# Patient Record
Sex: Female | Born: 1940
Health system: Southern US, Community
[De-identification: ages and names within clinical notes are randomized; demographics above are authoritative.]

## PROBLEM LIST (undated history)

## (undated) DIAGNOSIS — Z9889 Other specified postprocedural states: Secondary | ICD-10-CM

## (undated) DIAGNOSIS — J189 Pneumonia, unspecified organism: Secondary | ICD-10-CM

## (undated) DIAGNOSIS — Z860100 Personal history of colon polyps, unspecified: Secondary | ICD-10-CM

## (undated) DIAGNOSIS — J302 Other seasonal allergic rhinitis: Secondary | ICD-10-CM

## (undated) DIAGNOSIS — M19049 Primary osteoarthritis, unspecified hand: Secondary | ICD-10-CM

## (undated) DIAGNOSIS — H353290 Exudative age-related macular degeneration, unspecified eye, stage unspecified: Secondary | ICD-10-CM

## (undated) DIAGNOSIS — I10 Essential (primary) hypertension: Secondary | ICD-10-CM

## (undated) DIAGNOSIS — I493 Ventricular premature depolarization: Secondary | ICD-10-CM

## (undated) DIAGNOSIS — E039 Hypothyroidism, unspecified: Secondary | ICD-10-CM

## (undated) DIAGNOSIS — C4490 Unspecified malignant neoplasm of skin, unspecified: Secondary | ICD-10-CM

## (undated) DIAGNOSIS — Z85828 Personal history of other malignant neoplasm of skin: Secondary | ICD-10-CM

## (undated) DIAGNOSIS — I499 Cardiac arrhythmia, unspecified: Secondary | ICD-10-CM

## (undated) HISTORY — DX: Other seasonal allergic rhinitis: J30.2

## (undated) HISTORY — DX: Personal history of other malignant neoplasm of skin: Z85.828

## (undated) HISTORY — PX: OTHER SURGICAL HISTORY: SHX169

## (undated) HISTORY — PX: TONSILLECTOMY: SUR1361

## (undated) HISTORY — DX: Essential (primary) hypertension: I10

## (undated) HISTORY — DX: Unspecified malignant neoplasm of skin, unspecified: C44.90

## (undated) HISTORY — DX: Ventricular premature depolarization: I49.3

## (undated) HISTORY — PX: KNEE CARTILAGE SURGERY: SHX688

## (undated) HISTORY — DX: Personal history of colonic polyps: Z86.010

## (undated) HISTORY — DX: Primary osteoarthritis, unspecified hand: M19.049

## (undated) HISTORY — PX: EYE SURGERY: SHX253

## (undated) HISTORY — DX: Personal history of colon polyps, unspecified: Z86.0100

## (undated) HISTORY — PX: FACIAL COSMETIC SURGERY: SHX629

## (undated) HISTORY — DX: Hypothyroidism, unspecified: E03.9

---

## 1999-02-09 ENCOUNTER — Emergency Department (HOSPITAL_COMMUNITY): Admission: EM | Admit: 1999-02-09 | Discharge: 1999-02-09 | Payer: Self-pay | Admitting: *Deleted

## 1999-08-14 ENCOUNTER — Other Ambulatory Visit: Admission: RE | Admit: 1999-08-14 | Discharge: 1999-08-14 | Payer: Self-pay | Admitting: Obstetrics and Gynecology

## 1999-09-23 ENCOUNTER — Other Ambulatory Visit: Admission: RE | Admit: 1999-09-23 | Discharge: 1999-09-23 | Payer: Self-pay | Admitting: Urology

## 1999-12-23 HISTORY — PX: FACIAL COSMETIC SURGERY: SHX629

## 2000-08-12 ENCOUNTER — Other Ambulatory Visit: Admission: RE | Admit: 2000-08-12 | Discharge: 2000-08-12 | Payer: Self-pay | Admitting: Obstetrics and Gynecology

## 2000-09-16 ENCOUNTER — Encounter: Admission: RE | Admit: 2000-09-16 | Discharge: 2000-09-16 | Payer: Self-pay | Admitting: Obstetrics and Gynecology

## 2000-09-16 ENCOUNTER — Encounter: Payer: Self-pay | Admitting: Obstetrics and Gynecology

## 2001-08-11 ENCOUNTER — Other Ambulatory Visit: Admission: RE | Admit: 2001-08-11 | Discharge: 2001-08-11 | Payer: Self-pay | Admitting: Obstetrics and Gynecology

## 2001-09-17 ENCOUNTER — Encounter: Payer: Self-pay | Admitting: Obstetrics and Gynecology

## 2001-09-17 ENCOUNTER — Encounter: Admission: RE | Admit: 2001-09-17 | Discharge: 2001-09-17 | Payer: Self-pay | Admitting: Obstetrics and Gynecology

## 2002-08-16 ENCOUNTER — Other Ambulatory Visit: Admission: RE | Admit: 2002-08-16 | Discharge: 2002-08-16 | Payer: Self-pay | Admitting: Gynecology

## 2002-09-22 ENCOUNTER — Encounter: Payer: Self-pay | Admitting: Gynecology

## 2002-09-22 ENCOUNTER — Encounter: Admission: RE | Admit: 2002-09-22 | Discharge: 2002-09-22 | Payer: Self-pay | Admitting: Gynecology

## 2003-08-21 ENCOUNTER — Other Ambulatory Visit: Admission: RE | Admit: 2003-08-21 | Discharge: 2003-08-21 | Payer: Self-pay | Admitting: Gynecology

## 2003-10-03 ENCOUNTER — Encounter: Payer: Self-pay | Admitting: Gynecology

## 2003-10-03 ENCOUNTER — Encounter: Admission: RE | Admit: 2003-10-03 | Discharge: 2003-10-03 | Payer: Self-pay | Admitting: Gynecology

## 2003-12-23 HISTORY — PX: SHOULDER ARTHROSCOPY W/ ROTATOR CUFF REPAIR: SHX2400

## 2004-08-21 ENCOUNTER — Other Ambulatory Visit: Admission: RE | Admit: 2004-08-21 | Discharge: 2004-08-21 | Payer: Self-pay | Admitting: Gynecology

## 2004-11-28 ENCOUNTER — Encounter: Admission: RE | Admit: 2004-11-28 | Discharge: 2004-11-28 | Payer: Self-pay | Admitting: Gynecology

## 2005-02-28 ENCOUNTER — Ambulatory Visit (HOSPITAL_COMMUNITY): Admission: RE | Admit: 2005-02-28 | Discharge: 2005-03-01 | Payer: Self-pay | Admitting: Orthopaedic Surgery

## 2005-08-26 ENCOUNTER — Other Ambulatory Visit: Admission: RE | Admit: 2005-08-26 | Discharge: 2005-08-26 | Payer: Self-pay | Admitting: Gynecology

## 2005-12-25 ENCOUNTER — Encounter: Admission: RE | Admit: 2005-12-25 | Discharge: 2005-12-25 | Payer: Self-pay | Admitting: Gynecology

## 2006-05-07 ENCOUNTER — Encounter: Payer: Self-pay | Admitting: Vascular Surgery

## 2006-05-07 ENCOUNTER — Ambulatory Visit (HOSPITAL_COMMUNITY): Admission: RE | Admit: 2006-05-07 | Discharge: 2006-05-07 | Payer: Self-pay | Admitting: *Deleted

## 2006-09-01 ENCOUNTER — Other Ambulatory Visit: Admission: RE | Admit: 2006-09-01 | Discharge: 2006-09-01 | Payer: Self-pay | Admitting: Gynecology

## 2006-09-21 ENCOUNTER — Ambulatory Visit: Payer: Self-pay | Admitting: Internal Medicine

## 2006-11-17 ENCOUNTER — Ambulatory Visit: Payer: Self-pay | Admitting: Internal Medicine

## 2006-11-17 LAB — CONVERTED CEMR LAB
Chol/HDL Ratio, serum: 4.8
Cholesterol: 247 mg/dL (ref 0–200)

## 2006-11-24 ENCOUNTER — Ambulatory Visit: Payer: Self-pay | Admitting: Internal Medicine

## 2006-12-28 ENCOUNTER — Encounter: Admission: RE | Admit: 2006-12-28 | Discharge: 2006-12-28 | Payer: Self-pay | Admitting: Gynecology

## 2007-01-06 ENCOUNTER — Ambulatory Visit: Payer: Self-pay | Admitting: Internal Medicine

## 2007-01-06 LAB — CONVERTED CEMR LAB
AST: 17 units/L (ref 0–37)
HDL: 47.3 mg/dL (ref >39.0)
LDL Cholesterol: 82 mg/dL (ref 0–99)

## 2007-01-12 ENCOUNTER — Ambulatory Visit: Payer: Self-pay | Admitting: Internal Medicine

## 2007-05-24 ENCOUNTER — Ambulatory Visit: Payer: Self-pay | Admitting: Internal Medicine

## 2007-05-28 ENCOUNTER — Ambulatory Visit: Payer: Self-pay

## 2007-07-16 ENCOUNTER — Encounter: Payer: Self-pay | Admitting: Internal Medicine

## 2007-08-13 ENCOUNTER — Encounter: Payer: Self-pay | Admitting: Internal Medicine

## 2007-08-30 DIAGNOSIS — Z860100 Personal history of colon polyps, unspecified: Secondary | ICD-10-CM | POA: Insufficient documentation

## 2007-08-30 DIAGNOSIS — E039 Hypothyroidism, unspecified: Secondary | ICD-10-CM | POA: Insufficient documentation

## 2007-08-30 HISTORY — DX: Personal history of colonic polyps: Z86.010

## 2007-08-30 HISTORY — DX: Personal history of colon polyps, unspecified: Z86.0100

## 2007-09-14 ENCOUNTER — Encounter: Payer: Self-pay | Admitting: Internal Medicine

## 2007-09-14 ENCOUNTER — Other Ambulatory Visit: Admission: RE | Admit: 2007-09-14 | Discharge: 2007-09-14 | Payer: Self-pay | Admitting: Gynecology

## 2007-11-03 ENCOUNTER — Telehealth: Payer: Self-pay | Admitting: Internal Medicine

## 2008-01-07 ENCOUNTER — Encounter: Admission: RE | Admit: 2008-01-07 | Discharge: 2008-01-07 | Payer: Self-pay | Admitting: Gynecology

## 2008-02-02 ENCOUNTER — Ambulatory Visit: Payer: Self-pay | Admitting: Internal Medicine

## 2008-02-02 DIAGNOSIS — M79609 Pain in unspecified limb: Secondary | ICD-10-CM | POA: Insufficient documentation

## 2008-02-02 DIAGNOSIS — Z85828 Personal history of other malignant neoplasm of skin: Secondary | ICD-10-CM

## 2008-02-02 DIAGNOSIS — E782 Mixed hyperlipidemia: Secondary | ICD-10-CM | POA: Insufficient documentation

## 2008-02-02 HISTORY — DX: Personal history of other malignant neoplasm of skin: Z85.828

## 2008-02-02 LAB — CONVERTED CEMR LAB: Cholesterol, target level: 200 mg/dL

## 2008-05-16 ENCOUNTER — Ambulatory Visit: Payer: Self-pay | Admitting: Internal Medicine

## 2008-05-16 DIAGNOSIS — J019 Acute sinusitis, unspecified: Secondary | ICD-10-CM | POA: Insufficient documentation

## 2008-05-23 ENCOUNTER — Telehealth: Payer: Self-pay | Admitting: Internal Medicine

## 2008-09-21 LAB — CONVERTED CEMR LAB: Pap Smear: NORMAL

## 2008-10-24 ENCOUNTER — Encounter: Payer: Self-pay | Admitting: Internal Medicine

## 2009-01-18 ENCOUNTER — Telehealth: Payer: Self-pay | Admitting: *Deleted

## 2009-01-19 ENCOUNTER — Encounter: Admission: RE | Admit: 2009-01-19 | Discharge: 2009-01-19 | Payer: Self-pay | Admitting: Gynecology

## 2009-01-23 ENCOUNTER — Telehealth: Payer: Self-pay | Admitting: *Deleted

## 2009-04-24 ENCOUNTER — Ambulatory Visit: Payer: Self-pay | Admitting: Internal Medicine

## 2009-04-24 DIAGNOSIS — M19049 Primary osteoarthritis, unspecified hand: Secondary | ICD-10-CM | POA: Insufficient documentation

## 2009-04-24 HISTORY — DX: Primary osteoarthritis, unspecified hand: M19.049

## 2009-11-19 ENCOUNTER — Encounter (INDEPENDENT_AMBULATORY_CARE_PROVIDER_SITE_OTHER): Payer: Self-pay | Admitting: *Deleted

## 2010-01-22 ENCOUNTER — Encounter: Admission: RE | Admit: 2010-01-22 | Discharge: 2010-01-22 | Payer: Self-pay | Admitting: Gynecology

## 2010-01-22 LAB — HM MAMMOGRAPHY

## 2010-04-16 ENCOUNTER — Telehealth: Payer: Self-pay | Admitting: *Deleted

## 2010-04-26 ENCOUNTER — Ambulatory Visit: Payer: Self-pay | Admitting: Internal Medicine

## 2010-04-26 LAB — CONVERTED CEMR LAB
ALT: 20 units/L (ref 0–35)
AST: 19 units/L (ref 0–37)
Albumin: 4.2 g/dL (ref 3.5–5.2)
Alkaline Phosphatase: 65 units/L (ref 39–117)
Bilirubin, Direct: 0.1 mg/dL (ref 0.0–0.3)
Cholesterol: 168 mg/dL (ref 0–200)
Total CHOL/HDL Ratio: 3
Total Protein: 6.6 g/dL (ref 6.0–8.3)

## 2010-04-30 ENCOUNTER — Ambulatory Visit: Payer: Self-pay | Admitting: Internal Medicine

## 2010-04-30 DIAGNOSIS — R109 Unspecified abdominal pain: Secondary | ICD-10-CM | POA: Insufficient documentation

## 2010-04-30 DIAGNOSIS — I495 Sick sinus syndrome: Secondary | ICD-10-CM | POA: Insufficient documentation

## 2010-06-11 ENCOUNTER — Ambulatory Visit: Payer: Self-pay | Admitting: Internal Medicine

## 2010-08-15 ENCOUNTER — Encounter: Payer: Self-pay | Admitting: Internal Medicine

## 2010-11-25 ENCOUNTER — Encounter: Payer: Self-pay | Admitting: Internal Medicine

## 2010-11-27 ENCOUNTER — Encounter: Payer: Self-pay | Admitting: Internal Medicine

## 2010-11-27 ENCOUNTER — Ambulatory Visit: Payer: Self-pay | Admitting: Internal Medicine

## 2010-11-27 LAB — CONVERTED CEMR LAB
INR: 1 (ref 0.8–1.0)
Prothrombin Time: 10.3 s (ref 9.7–11.8)
aPTT: 32.4 s — ABNORMAL HIGH (ref 21.7–28.8)

## 2010-11-28 ENCOUNTER — Encounter: Payer: Self-pay | Admitting: Internal Medicine

## 2011-01-22 ENCOUNTER — Other Ambulatory Visit: Payer: Self-pay | Admitting: Gynecology

## 2011-01-22 DIAGNOSIS — Z1239 Encounter for other screening for malignant neoplasm of breast: Secondary | ICD-10-CM

## 2011-01-23 NOTE — Assessment & Plan Note (Signed)
Summary: MED CLEARANCE // RS   Vital Signs:  Patient profile:   70 year old female Menstrual status:  postmenopausal Temp:     98.1 degrees F oral Pulse rate:   60 / minute BP sitting:   110 / 60  (left arm) Cuff size:   regular  Vitals Entered By: Romualdo Bolk, CMA (AAMA) (November 27, 2010 11:31 AM) CC: Medical Clearence for cosmetic surgery. Surgery to be done on 12/24/10. Pt needs cbc with diff, CMP, Pt/PTT and EKG   History of Present Illness: Christina Myers   comes in today  for above . Since last visit  here  there have been no major changes in health status  . Cosmetic surgery.  face  lift  redo  in a few weeks at [facility]   Since last visit  here  there have been no major changes in health status  .  Ob/  gyne  doing ok.   Thyroid :   had blood drawn.   this week ? results . NO bleeding Cv pulm issues,  very active.    Preventive Screening-Counseling & Management  Alcohol-Tobacco     Alcohol drinks/day: <1     Smoking Status: quit     Year Quit: over 10 years  Caffeine-Diet-Exercise     Caffeine use/day: 1-2     Does Patient Exercise: yes  Current Medications (verified): 1)  Calcium 500 Mg Tabs (Calcium) .... Take 2)  D 1000 1000 Unit Caps (Cholecalciferol) .... 2000 International Units A Day 3)  Synthroid 100 Mcg Tabs (Levothyroxine Sodium) .... Take 1 Tablet By Mouth Once A Day 4)  Vitamin E 400 Unit Caps (Vitamin E) .... Take 5)  Estradiol 0.025 Mg/24hr Ptwk (Estradiol) 6)  Zocor 20 Mg Tabs (Simvastatin) .... Take 1 Tablet By Mouth Every Night. 7)  Adult Aspirin Low Strength 81 Mg  Tbdp (Aspirin) 8)  Prometrium 200 Mg  Caps (Progesterone Micronized) .[name].. 12 Days Semi-Annually 9)  Estrace 0.1 Mg/gm Crea (Estradiol)  Allergies (verified): 1)  ! Omnicef 2)  * Augmentin  Past History:  Past medical, surgical, family and social histories (including risk factors) reviewed, and no changes noted (except as noted below).  Past Medical History: Reviewed  history from 02/02/2008 and no changes required. Colonic polyps, hx of Hypertension Hypothyroidism Allergies Skin cancer, hx ofbcca of face [facility] evaluation for PVCs 7/03  Past Surgical History: Reviewed history from 08/30/2007 and no changes required. Tubal ligation Tonsillectomy  Past History:  Care Management: Cardiology: [facility]- in the past Gynecology: Pamelia Hoit, NPA Gastroenterology: Dr. Kinnie Scales Dermatology: Karlyn Agee  yearly body check  Surgery: Plastic- Dr. Oswaldo Milian  Family History: Reviewed history from 08/30/2007 and no changes required. Family History of Colon CA 1st degree relative <60 Family History Diabetes 1st degree relative Family History of Prostate CA 1st degree relative <50 Family History of Stroke F 1st degree relative <60 Family History of Cardiovascular disorder  Social History: Reviewed history from 04/30/2010 and no changes required. Occupation: Married Former Smoker Alcohol use-no Regular exercise-yes  plays tennis.  A pet cat  hh of 2  tennis and teaches tap dancing classes  Review of Systems       12 sytem review neg for dcv pulm gi issues .   no se of meds  gets ocass HA .    Physical Exam  General:  Well-developed,well-nourished,in no acute distress; alert,appropriate and cooperative throughout examination Head:  normocephalic and atraumatic.   Eyes:  PERRL,  EOMs full, conjunctiva clear  Ears:  no external deformities.   Nose:  no external deformity, no external erythema, and no nasal discharge.   Mouth:  good dentition and pharynx pink and moist.   Neck:  No deformities, masses, or tenderness noted. Lungs:  Normal respiratory effort, chest expands symmetrically. Lungs are clear to auscultation, no crackles or wheezes.no dullness.   Heart:  Normal rate and regular rhythm. S1 and S2 normal without gallop, murmur, click, rub or other extra sounds.no lifts.   Abdomen:  Bowel sounds positive,abdomen soft and non-tender  without masses, organomegaly or hernias noted. Pulses:  pulses intact without delay  no bruits  Extremities:  no clubbing cyanosis or edema  Neurologic:  alert & oriented X3, strength normal in all extremities, gait normal, and DTRs symmetrical and normal.   Skin:  turgor normal, color normal, no ecchymoses, and no petechiae.   Cervical Nodes:  No lymphadenopathy noted Psych:  Normal eye contact, appropriate affect. Cognition appears normal.  EKG NSR  ocass pvc  rhythm stip  normal sinus .  Impression & Recommendations:  Problem # 1:  PREOPERATIVE EXAMINATION (ICD-V72.84) no ci to surgery . healthy   low risk.  [name]  avoid asa pre op.   Orders: TLB-PTT (85730-PTTL) TLB-PT (Protime) (85610-PTP) Venipuncture (81191) Specimen Handling (47829) EKG w/ Interpretation (93000)  Problem # 2:  HYPERLIPIDEMIA (ICD-272.2)  Her updated medication list for this problem includes:    Zocor 20 Mg Tabs (Simvastatin) .[name]... Take 1 tablet by mouth every night.  Orders: EKG w/ Interpretation (93000)  Problem # 3:  HYPOTHYROIDISM (ICD-244.9)  per gyne .  ok to  transfer care from gyne when needed.  get copy of labs from gyne Her updated medication list for this problem includes:    Synthroid 100 Mcg Tabs (Levothyroxine sodium) .[name]... Take 1 tablet by mouth once a day  Orders: EKG w/ Interpretation (93000)  Complete Medication List: 1)  Calcium 500 Mg Tabs (Calcium) .... Take 2)  D 1000 1000 Unit Caps (Cholecalciferol) .... 2000 international units a day 3)  Synthroid 100 Mcg Tabs (Levothyroxine sodium) .... Take 1 tablet by mouth once a day 4)  Vitamin E 400 Unit Caps (Vitamin e) .... Take 5)  Estradiol 0.025 Mg/24hr Ptwk (Estradiol) 6)  Zocor 20 Mg Tabs (Simvastatin) .... Take 1 tablet by mouth every night. 7)  Adult Aspirin Low Strength 81 Mg Tbdp (Aspirin) 8)  Prometrium 200 Mg Caps (Progesterone micronized) .[name].. 12 days semi-annually 9)  Estrace 0.1 Mg/gm Crea (Estradiol)  Patient  Instructions: 1)  You will be informed of lab results when available.  2)  will fax  information to surgeon.  3)  Low risk and ok to to proceed with surgery.    Orders Added: 1)  TLB-PTT [85730-PTTL] 2)  TLB-PT (Protime) [85610-PTP] 3)  Venipuncture [56213] 4)  Specimen Handling [99000] 5)  Est. Patient Level IV [08657] 6)  EKG w/ Interpretation [93000]

## 2011-01-23 NOTE — Procedures (Signed)
Summary: Colonoscopy Report/Dr. Sharrell Ku  Colonoscopy Report/Dr. Sharrell Ku   Imported By: Maryln Gottron 08/23/2010 13:09:38  _____________________________________________________________________  External Attachment:    Type:   Image     Comment:   External Document

## 2011-01-23 NOTE — Assessment & Plan Note (Signed)
Summary: shingles vaccine per Meela Wareing/cjr   Nurse Visit   Allergies: 1)  ! Omnicef 2)  * Augmentin  Immunizations Administered:  Zostavax # 1:    Vaccine Type: Zostavax    Site: left deltoid    Mfr: Merck    Dose: 0.5 ml    Route: [place]    Given by: Romualdo Bolk, CMA (AAMA)    Exp. Date: 07/17/2011    Lot #: [id number]  Orders Added: 1)  Zoster (Shingles) Vaccine Live [90736] 2)  Admin 1st Vaccine [phone]

## 2011-01-23 NOTE — Assessment & Plan Note (Signed)
Summary: follow up on labs/ssc pt rsc/njr/pt to come in at 1:30pm/ssc   Vital Signs:  Patient profile:   70 year old female Menstrual status:  postmenopausal Temp:     98.2 degrees F oral Pulse rate:   60 / minute BP sitting:   120 / 80  (right arm) Cuff size:   regular  Vitals Entered By: Romualdo Bolk, CMA (AAMA) (Apr 30, 2010 1:50 PM) declined Weight. CC: Follow-up visit on labs   History of Present Illness: Christina Myers comesin comes in today  for follow up of medication lipid managment. Since last visit  here  there have been no major changes in health status   however she has a new onset concern  below:  abd cramps after eating for 8 days  about 8-9   and then problem until 4 am., Husband had one episode. But is better  May1st  first has had waves of nausea and no  vomiting    early in am  .   The this progressed .  Continues to remain as active as possible  no cp sob.  Travel  [place]  in February    cruise   1-2 weeks  .   NO recent antibiotic . NO weight loss.  Is taking  otc fish oil but not new.  No change IN thyroid med.  City water   No change in meds   UTD colonoscopy   3 years ago.    lat gyne check NOv ok  Preventive Screening-Counseling & Management  Alcohol-Tobacco     Alcohol drinks/day: <1     Smoking Status: quit     Year Quit: over 10 years  Caffeine-Diet-Exercise     Caffeine use/day: 1-2     Does Patient Exercise: yes  Current Medications (verified): 1)  Calcium 500 Mg Tabs (Calcium) .... Take 2)  D 1000 1000 Unit Caps (Cholecalciferol) .... Take 3)  Estring 2 Mg Ring (Estradiol) .... Insert 4)  Synthroid 100 Mcg Tabs (Levothyroxine Sodium) .... Take 1 Tablet By Mouth Once A Day 5)  Vitamin E 400 Unit Caps (Vitamin E) .... Take 6)  Estradiol 0.025 Mg/24hr Ptwk (Estradiol) 7)  Zocor 20 Mg Tabs (Simvastatin) .... Take 1 Tablet By Mouth Every Night. 8)  Adult Aspirin Low Strength 81 Mg  Tbdp (Aspirin) 9)  Prometrium 200 Mg  Caps  (Progesterone Micronized) .[name].. 12 Days Quarterly  Allergies (verified): 1)  ! Omnicef 2)  * Augmentin  Past History:  Past medical, surgical, family and social histories (including risk factors) reviewed for relevance to current acute and chronic problems.  Past Medical History: Reviewed history from 02/02/2008 and no changes required. Colonic polyps, hx of Hypertension Hypothyroidism Allergies Skin cancer, hx ofbcca of face [facility] evaluation for PVCs 7/03  Past Surgical History: Reviewed history from 08/30/2007 and no changes required. Tubal ligation Tonsillectomy  Past History:  Care Management: Cardiology: [facility]- in the past Gynecology: Pamelia Hoit, NPA Gastroenterology: Dr. Kinnie Scales Dermatology: Karlyn Agee  yearly body check   Family History: Reviewed history from 08/30/2007 and no changes required. Family History of Colon CA 1st degree relative <60 Family History Diabetes 1st degree relative Family History of Prostate CA 1st degree relative <50 Family History of Stroke F 1st degree relative <60 Family History of Cardiovascular disorder  Social History: Reviewed history from 04/24/2009 and no changes required. Occupation: Married Former Smoker Alcohol use-no Regular exercise-yes  plays tennis.  A pet cat  hh of 2  Review of Systems       The patient complains of anorexia.  The patient denies fever, weight loss, weight gain, vision loss, hoarseness, chest pain, syncope, dyspnea on exertion, peripheral edema, prolonged cough, headaches, hemoptysis, melena, hematochezia, severe indigestion/heartburn, hematuria, transient blindness, difficulty walking, unusual weight change, enlarged lymph nodes, and angioedema.    Physical Exam  General:  Well-developed,well-nourished,in no acute distress; alert,appropriate and cooperative throughout examination. Patient declined weight today Head:  Normocephalic and atraumatic without obvious abnormalities. No  apparent alopecia or balding. Eyes:  vision grossly intact, pupils equal, and pupils round.   Ears:  R ear normal, L ear normal, and no external deformities.   Mouth:  pharynx pink and moist.   Neck:  No deformities, masses, or tenderness noted. thyroid palpable Lungs:  Normal respiratory effort, chest expands symmetrically. Lungs are clear to auscultation, no crackles or wheezes. Heart:  no murmur and no gallop.  slow rate 48  with ocass pause  nl perfusion Abdomen:  soft, non-tender, normal bowel sounds, no distention, no guarding, no rigidity, no rebound tenderness, no abdominal hernia, no hepatomegaly, and no splenomegaly.  no bruits   gurggly   bowel sounds  Pulses:  pulses intact without delay   Extremities:  no clubbing cyanosis or edema  Neurologic:  alert & oriented X3, strength normal in all extremities, and gait normal.   Skin:  turgor normal, color normal, no ecchymoses, no petechiae, and no purpura.   Cervical Nodes:  No lymphadenopathy noted Psych:  Oriented X3, normally interactive, good eye contact, not anxious appearing, and not depressed appearing.     Impression & Recommendations:  Problem # 1:  ABDOMINAL CRAMPS (ICD-789.00)  post prandial in  evening    with naiusea also   no chang einmeds   no other exposures  .    Seems non decript but  ? assoicated with pm meal. exam nl today otherwise     Problem # 2:  SINUS BRADYCARDIA (ICD-427.81)  rate on exam was 48 so ekg done and showed  rate of 52 at rest with nl  intervals .  No symptoms related to this  Her updated medication list for this problem includes:    Adult Aspirin Low Strength 81 Mg Tbdp (Aspirin)  Orders: EKG w/ Interpretation (93000)  Problem # 3:  HYPERLIPIDEMIA (ICD-272.2)  Her updated medication list for this problem includes:    Zocor 20 Mg Tabs (Simvastatin) .[name]... Take 1 tablet by mouth every night.  Labs Reviewed: SGOT: 19 (04/26/2010)   SGPT: 20 (04/26/2010)  Lipid Goals: Chol Goal: 200  (02/02/2008)   HDL Goal: 40 (02/02/2008)   LDL Goal: 130 (02/02/2008)   TG Goal: 150 (02/02/2008)  Prior 10 Yr Risk Heart Disease: 5 % (02/02/2008)   HDL:55.60 (04/26/2010), 47.3 (01/06/2007)  LDL:89 (04/26/2010), 82 (01/06/2007)  Chol:168 (04/26/2010), 158 (01/06/2007)  Trig:119.0 (04/26/2010), 142 (01/06/2007)  Problem # 4:  HYPOTHYROIDISM (ICD-244.9)  Her updated medication list for this problem includes:    Synthroid 100 Mcg Tabs (Levothyroxine sodium) .[name]... Take 1 tablet by mouth once a day  Labs Reviewed: Chol: 168 (04/26/2010)   HDL: 55.60 (04/26/2010)   LDL: 89 (04/26/2010)   TG: 119.0 (04/26/2010)  Problem # 5:  Preventive Health Care (ICD-V70.0) dis zostavax NOt avaialbe from manufacturer delay  agree with getting this when available.   Problem # 6:  HRT (ICD-V07.4) per Dr Nicholas Lose office   Complete Medication List: 1)  Calcium 500 Mg Tabs (Calcium) .... Take 2)  D 1000 1000 Unit Caps (Cholecalciferol) .... Take 3)  Estring 2 Mg Ring (Estradiol) .... Insert 4)  Synthroid 100 Mcg Tabs (Levothyroxine sodium) .... Take 1 tablet by mouth once a day 5)  Vitamin E 400 Unit Caps (Vitamin e) .... Take 6)  Estradiol 0.025 Mg/24hr Ptwk (Estradiol) 7)  Zocor 20 Mg Tabs (Simvastatin) .... Take 1 tablet by mouth every night. 8)  Adult Aspirin Low Strength 81 Mg Tbdp (Aspirin) 9)  Prometrium 200 Mg Caps (Progesterone micronized) .[name].. 12 days quarterly  Patient Instructions: 1)  Stop the fish oil for now  . 2)  If the stomach problem doesnt get better  in another week then  3)  would recommend   seeing   Dr Kinnie Scales   4)  otherwise    rov in a year .   Prescriptions: ZOCOR 20 MG TABS (SIMVASTATIN) Take 1 tablet by mouth every night.  #90 x 3   Entered and Authorized by:   Madelin Headings MD   Signed by:   Madelin Headings MD on 04/30/2010   Method used:   Electronically to        [facility] ([facility]ail)       [address]       [place], [place]   [address]       Ph: [phone]       Fax: [phone]   RxID:   [id number] ZOCOR 20 MG TABS (SIMVASTATIN) Take 1 tablet by mouth every night.  #30 x 12   Entered and Authorized by:   Madelin Headings MD   Signed by:   Madelin Headings MD on 04/30/2010   Method used:   Electronically to        [facility] ([facility])       [address]       [place], [place]  [address]       Ph: [phone]       Fax: [phone]   RxID:   [id number]

## 2011-01-23 NOTE — Progress Notes (Signed)
Summary: chole in up ov or ?med increase  Phone Note Call from Patient Call back at Home Phone [phone]   Caller: Patient Call For: Madelin Headings MD Summary of Call: pt had lab done in 10-2009 by dr lomax her chole was up 210 from 152 pt is on simvastin 20 mg, pt is requesting  to come in this wk fasting or med ?increase [facility] [address] pt would like about [facility] is needed. Initial call taken by: Heron Sabins,  April 16, 2010 4:53 PM  Follow-up for Phone Call        Labs were scanned into EMR. Follow-up by: Romualdo Bolk, CMA Duncan Dull),  April 16, 2010 5:03 PM  Additional Follow-up for Phone Call Additional follow up Details #1::        Would get labs first  and then OV  todiscuss .  LIPIds   LFTS    272.4   Additional Follow-up by: Madelin Headings MD,  April 16, 2010 5:16 PM    Additional Follow-up for Phone Call Additional follow up Details #2::    Pt aware and would like a 30 days to [facility] [address]. Follow-up by: Romualdo Bolk, CMA (AAMA),  April 17, 2010 1:51 PM  Prescriptions: ZOCOR 20 MG TABS (SIMVASTATIN) Take 1 tablet by mouth every night.  #30 x 0   Entered by:   Romualdo Bolk, CMA (AAMA)   Authorized by:   Madelin Headings MD   Signed by:   Romualdo Bolk, CMA (AAMA) on 04/17/2010   Method used:   Electronically to        [facility]       [address]       [place], [place]  [address]       Ph: [phone]       Fax: [phone]   RxID:   [id number]

## 2011-01-28 ENCOUNTER — Ambulatory Visit
Admission: RE | Admit: 2011-01-28 | Discharge: 2011-01-28 | Disposition: A | Discharge status: Home / Self Care | Payer: Medicare PPO | Source: Ambulatory Visit | Attending: Gynecology | Admitting: Gynecology

## 2011-01-28 DIAGNOSIS — Z1239 Encounter for other screening for malignant neoplasm of breast: Secondary | ICD-10-CM

## 2011-05-09 NOTE — Op Note (Signed)
NAMEKEIONNA, KINNAIRD NO.:  [id number]   MEDICAL RECORD NO.:  [id number]          PATIENT TYPE:  OIB   LOCATION:  5002                         FACILITY:  [facility]   PHYSICIAN:  Vanita Panda. Magnus Ivan, M.D.DATE OF BIRTH:  01-Jul-1941   DATE OF PROCEDURE:  02/28/2005  DATE OF DISCHARGE:  03/01/2005                                 OPERATIVE REPORT   PREOPERATIVE DIAGNOSIS:  Right shoulder rotator cuff tear.   POSTOPERATIVE DIAGNOSIS:  Right shoulder rotator cuff tear.   PROCEDURE:  Right shoulder arthroscopy with debridement, subacromial  decompression and arthroscopic rotator cuff repair.   SURGEON:  Vanita Panda. Magnus Ivan, M.D.   ANESTHESIA:  Regional block with general anesthesia.   BLOOD LOSS:  Minimal.   COMPLICATIONS:  None.   INDICATIONS:  Briefly, Ms. 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  She had not had problems with her shoulder before and then  some time __________ she developed pain in her shoulder after playing  tennis.  Injections were performed in her shoulder by her primary care  physician.  Subsequently, an MRI was obtained and she was found to have a  full thickness rotator cuff tear of the supraspinatus tendon.  On  examination, she certainly had weakness of the shoulder and signs of  impingement and bursitis.  It was recommended that given her level of  function, and this being her dominant shoulder, she should undergo should  arthroscopy with cuff repair.  The risks and benefits of this were explained  to her and she agreed to proceed with surgery.   PROCEDURE DESCRIPTION:  After regional anesthesia was obtained and informed  consent was obtained, she was brought to the operating room, placed supine  on the operating table and general anesthesia was obtained.  She was then  maneuvered into a beach chair position with her head appropriately secured  with a chin strap and a head strap.  Her non-operative arm was well  padded  under the ulna and wrist.  Her right arm was then prepped and draped with  DuraPrep, sterile drapes including a sterile stockinette, and was placed in  arm retractor position.  First, a posterior portal was made and the  arthroscope was inserted into the glenohumeral joint.  There was noted nice  intact cartilage on the glenoid and the humeral head but the significant  fraying of the anterior labrum.  There was synovitis along the biceps tendon  as well and an obvious full thickness tear of the rotator cuff.  An anterior  portal was made at the interval between the biceps tendon and the  subscapularis tendon and through an anterior portal that was lateral to the  coracoid process a shaver was inserted.  Debridement was carried out within  the glenohumeral joint of the frayed labrum as well as the synovitis around  the biceps tendon and the undersurface of the rotator cuff.  The instruments  were then removed into the posterior portal, access to the subacromial space  was obtained.  A lateral portal was then made  and a subacromial  decompression was carried out, including shaving the undersurface of the  acromion and cleaning out significant bursitis.  The large rotator cuff tear  with approximately 1.5 cm of retraction was noted.  This was mobilized so it  could be brought over to its footprint on the greater tuberosity.  A second  portal just anterior to the lateral portal was made and a bioabsorbable  screw was inserted after a punch and tap into the greater tuberosity area.  Using horizontal stitch format, the rotator cuff stitches were placed into  the inferior portion of the supraspinatus tendon and the tendon was brought  over to a resting position.  This was tied down arthroscopically as well.  A  similar procedure was done with a second screw to bring the rotator cuff  over.  The shoulder was put through a range of motion and the cuff and  shoulder moved as a unit.  The  instruments were then removed and the portal  sites were all closed with interrupted 4-0 nylon suture.  A well padded  abduction pillow splint was then placed on the patient.  She was awakened,  extubated and taken to the recovery room in stable condition.      CYB/MEDQ  D:  03/01/2005  T:  03/02/2005  Job:  [id number]

## 2011-10-01 DIAGNOSIS — Z7189 Other specified counseling: Secondary | ICD-10-CM | POA: Insufficient documentation

## 2011-11-03 ENCOUNTER — Encounter: Payer: Self-pay | Admitting: Internal Medicine

## 2011-11-03 ENCOUNTER — Ambulatory Visit (INDEPENDENT_AMBULATORY_CARE_PROVIDER_SITE_OTHER): Payer: Medicare PPO | Admitting: Internal Medicine

## 2011-11-03 DIAGNOSIS — R05 Cough: Secondary | ICD-10-CM

## 2011-11-03 DIAGNOSIS — Z79890 Hormone replacement therapy (postmenopausal): Secondary | ICD-10-CM

## 2011-11-03 DIAGNOSIS — E039 Hypothyroidism, unspecified: Secondary | ICD-10-CM

## 2011-11-03 DIAGNOSIS — R059 Cough, unspecified: Secondary | ICD-10-CM | POA: Insufficient documentation

## 2011-11-03 DIAGNOSIS — E782 Mixed hyperlipidemia: Secondary | ICD-10-CM

## 2011-11-03 NOTE — Patient Instructions (Signed)
Will notify you  of labs when available.  Follow up depending on results  Have pharmacy contact us electronically for refills  Of meds. Cough may last another week. Call if fever or shortness of breath

## 2011-11-03 NOTE — Progress Notes (Signed)
  Subjective:    Patient ID: Christina Myers, female    DOB: 1941-07-27, 70 y.o.   MRN: [id number]  HPI Patient comes  in today for follow up of  multiple medical problems.  Last visit was over a year ago . Since that time:  Currently has a chest cold uri   Coughing  From  Contact person  After [place] trip.   Onset 4 day.   No fever  Cough with RTi getting better. No pain no sob    LIPIDS  No se of meds Due for labs yearly. Due for labs this year.   Thyroid  No change in meds and no hair changes edema.   hrt the same at this time  Cosmetic surgery no complications  Review of Systems ROS:  GEN/ HEENTNo fever, significant weight changes sweats headaches vision problems hearing changes, CV/ PULM; No chest pain shortness of breath , syncope,edema  change in exercise tolerance. GI /GU: No adominal pain, vomiting, change in bowel habits. No blood in the stool. No significant GU symptoms. SKIN/HEME: ,no acute skin rashes suspicious lesions or bleeding. No lymphadenopathy, nodules, masses.  NEURO/ PSYCH:  No neurologic signs such as weakness numbness No depression anxiety. IMM/ Allergy: No unusual infections.   REST of 12 system review negative     Objective:   Physical Exam WDWN in NAD  quiet respirations; mildly congested  somewhat hoarse. Non toxic . HEENT: Normocephalic ;atraumatic , Eyes;  PERRL, EOMs  Full, lids and conjunctiva clear,,Ears: no deformities, canals nl, TM landmarks normal, Nose: no deformity or discharge but congested;face non  tender Mouth : OP clear without lesion or edema . Neck: Supple without adenopathy or masses or bruits Chest:  Clear to A&P without wheezes rales or rhonchi CV:  S1-S2 no gallops or murmurs peripheral perfusion is normal Skin :nl perfusion and no acute rashes  Abdomen:  Soft normal bowel sounds without hepatosplenomegaly, no guarding rebound or masses no CVA tenderness Skin: normal capillary refill ,turgor , color: No acute rashes ,petechiae or  bruising. NEURO: oriented x 3 CN 3-12 appear intact. No focal muscle weakness or atrophy. Gait WNL.  Grossly non focal. No tremor or abnormal movement.     Assessment & Plan:  Hypothyroid  Check labs today nose  No palpitation . Lipids: no se of meds  As reported  HRT  Per gyne  Taking this to " stay young" skin etc. nad menopausual sx  Low dose.   Cough  Viral uri  Seems uncomplicated sx  Rx.  Call or seek care  if  persistent or progressive or alarm symptoms as discussed.

## 2011-11-04 LAB — CBC WITH DIFFERENTIAL/PLATELET
Basophils Relative: 0.2 % (ref 0.0–3.0)
Hemoglobin: 13.7 g/dL (ref 12.0–15.0)
Monocytes Relative: 9.4 % (ref 3.0–12.0)
Neutro Abs: 5.5 10*3/uL (ref 1.4–7.7)
Neutrophils Relative %: 70.7 % (ref 43.0–77.0)

## 2011-11-04 LAB — HEPATIC FUNCTION PANEL
ALT: 29 U/L (ref 0–35)
AST: 26 U/L (ref 0–37)
Alkaline Phosphatase: 64 U/L (ref 39–117)
Total Bilirubin: 0.5 mg/dL (ref 0.3–1.2)
Total Protein: 6.6 g/dL (ref 6.0–8.3)

## 2011-11-04 LAB — LIPID PANEL
HDL: 48.2 mg/dL (ref >39.00)
LDL Cholesterol: 57 mg/dL (ref 0–99)
Total CHOL/HDL Ratio: 3
Triglycerides: 185 mg/dL — ABNORMAL HIGH (ref 0.0–149.0)
VLDL: 37 mg/dL (ref 0.0–40.0)

## 2011-11-04 LAB — BASIC METABOLIC PANEL
CO2: 28 mEq/L (ref 19–32)
Chloride: 103 mEq/L (ref 96–112)
Potassium: 4.4 mEq/L (ref 3.5–5.1)
Sodium: 141 mEq/L (ref 135–145)

## 2011-11-04 LAB — TSH: TSH: 0.49 u[IU]/mL (ref 0.35–5.50)

## 2011-11-06 ENCOUNTER — Encounter: Payer: Self-pay | Admitting: *Deleted

## 2011-11-06 DIAGNOSIS — Z79890 Hormone replacement therapy (postmenopausal): Secondary | ICD-10-CM | POA: Insufficient documentation

## 2011-11-06 NOTE — Progress Notes (Signed)
Quick Note:    Letter sent  ______

## 2012-01-09 ENCOUNTER — Other Ambulatory Visit: Payer: Self-pay | Admitting: Gynecology

## 2012-01-09 DIAGNOSIS — Z1231 Encounter for screening mammogram for malignant neoplasm of breast: Secondary | ICD-10-CM

## 2012-01-21 ENCOUNTER — Telehealth: Payer: Self-pay | Admitting: *Deleted

## 2012-01-21 MED ORDER — SYNTHROID 100 MCG PO TABS: 100.0000 ug | ORAL_TABLET | Freq: Every day | ORAL | Status: DC

## 2012-01-21 NOTE — Telephone Encounter (Signed)
refill 

## 2012-01-23 ENCOUNTER — Telehealth: Payer: Self-pay | Admitting: Internal Medicine

## 2012-01-23 MED ORDER — SIMVASTATIN 20 MG PO TABS: 20.0000 mg | ORAL_TABLET | Freq: Every day | ORAL | Status: DC

## 2012-01-23 NOTE — Telephone Encounter (Signed)
Pt called and said that she normally gets a year supply of Synthroid and Zocor. Pls call in to [facility] on [address] [phone]. Pt wants to know why this was only sent in for 90 day supply without any refills on it?

## 2012-01-23 NOTE — Telephone Encounter (Signed)
Pt aware of this. 

## 2012-01-30 ENCOUNTER — Ambulatory Visit
Admission: RE | Admit: 2012-01-30 | Discharge: 2012-01-30 | Disposition: A | Discharge status: Home / Self Care | Payer: Medicare PPO | Source: Ambulatory Visit | Attending: Gynecology | Admitting: Gynecology

## 2012-01-30 DIAGNOSIS — Z1231 Encounter for screening mammogram for malignant neoplasm of breast: Secondary | ICD-10-CM

## 2012-04-22 ENCOUNTER — Other Ambulatory Visit: Payer: Self-pay | Admitting: Internal Medicine

## 2012-09-22 ENCOUNTER — Telehealth: Payer: Self-pay | Admitting: Internal Medicine

## 2012-09-22 NOTE — Telephone Encounter (Signed)
Pt's appt time is 9:45.  Her last labs were on 11/03/11.  Please advise.

## 2012-09-22 NOTE — Telephone Encounter (Signed)
Pt called and is sch to come on 10/13/12 for a med check. Pt is wondering if she should come in prior to get some lab work done, or is it ok to come in fasting to ov? Pt aware Dr Fabian Sharp is out of office until 09/27/12?

## 2012-09-22 NOTE — Telephone Encounter (Signed)
If we can, fasting labs pre visit would be helpful to have results   Lipids, lfts, bmp, and tsh and cbcdiff   Dx hypothyroid, hyperlipidemia , Osteoarthritis, medication management

## 2012-09-23 ENCOUNTER — Other Ambulatory Visit: Payer: Self-pay | Admitting: Family Medicine

## 2012-09-23 DIAGNOSIS — E785 Hyperlipidemia, unspecified: Secondary | ICD-10-CM

## 2012-09-23 DIAGNOSIS — E039 Hypothyroidism, unspecified: Secondary | ICD-10-CM

## 2012-09-23 DIAGNOSIS — Z79899 Other long term (current) drug therapy: Secondary | ICD-10-CM

## 2012-09-23 DIAGNOSIS — M1990 Unspecified osteoarthritis, unspecified site: Secondary | ICD-10-CM

## 2012-09-23 NOTE — Telephone Encounter (Signed)
Pt needs fasting lab appt before her appt on 10/13/12.  Left message for her to return my call.

## 2012-09-24 NOTE — Telephone Encounter (Signed)
Pt scheduled for lab appt and notified to be fasting.  Orders placed in the system.

## 2012-10-06 ENCOUNTER — Other Ambulatory Visit: Payer: Medicare PPO

## 2012-10-06 ENCOUNTER — Other Ambulatory Visit (INDEPENDENT_AMBULATORY_CARE_PROVIDER_SITE_OTHER): Payer: Medicare Other

## 2012-10-06 DIAGNOSIS — M1990 Unspecified osteoarthritis, unspecified site: Secondary | ICD-10-CM

## 2012-10-06 DIAGNOSIS — E785 Hyperlipidemia, unspecified: Secondary | ICD-10-CM

## 2012-10-06 DIAGNOSIS — Z79899 Other long term (current) drug therapy: Secondary | ICD-10-CM

## 2012-10-06 DIAGNOSIS — E039 Hypothyroidism, unspecified: Secondary | ICD-10-CM

## 2012-10-06 LAB — CBC WITH DIFFERENTIAL/PLATELET
Basophils Relative: 0.5 % (ref 0.0–3.0)
Eosinophils Absolute: 0.1 10*3/uL (ref 0.0–0.7)
Eosinophils Relative: 1.5 % (ref 0.0–5.0)
Hemoglobin: 14.7 g/dL (ref 12.0–15.0)
Lymphocytes Relative: 34.1 % (ref 12.0–46.0)
MCHC: 33.5 g/dL (ref 30.0–36.0)
MCV: 95.8 fl (ref 78.0–100.0)
Monocytes Absolute: 0.4 10*3/uL (ref 0.1–1.0)
Neutro Abs: 3.2 10*3/uL (ref 1.4–7.7)
Neutrophils Relative %: 56.2 % (ref 43.0–77.0)
RBC: 4.6 Mil/uL (ref 3.87–5.11)
WBC: 5.7 10*3/uL (ref 4.5–10.5)

## 2012-10-06 LAB — HEPATIC FUNCTION PANEL
ALT: 15 U/L (ref 0–35)
Bilirubin, Direct: 0.1 mg/dL (ref 0.0–0.3)
Total Bilirubin: 0.5 mg/dL (ref 0.3–1.2)

## 2012-10-06 LAB — BASIC METABOLIC PANEL
BUN: 15 mg/dL (ref 6–23)
CO2: 27 mEq/L (ref 19–32)
Glucose, Bld: 89 mg/dL (ref 70–99)
Potassium: 4.1 mEq/L (ref 3.5–5.1)
Sodium: 139 mEq/L (ref 135–145)

## 2012-10-06 LAB — LIPID PANEL
HDL: 55.8 mg/dL (ref >39.00)
LDL Cholesterol: 87 mg/dL (ref 0–99)
VLDL: 29.6 mg/dL (ref 0.0–40.0)

## 2012-10-13 ENCOUNTER — Encounter: Payer: Self-pay | Admitting: Internal Medicine

## 2012-10-13 ENCOUNTER — Ambulatory Visit (INDEPENDENT_AMBULATORY_CARE_PROVIDER_SITE_OTHER): Payer: Medicare Other | Admitting: Internal Medicine

## 2012-10-13 VITALS — BP 106/74 | HR 66 | Temp 98.0°F

## 2012-10-13 DIAGNOSIS — E782 Mixed hyperlipidemia: Secondary | ICD-10-CM

## 2012-10-13 DIAGNOSIS — Z79890 Hormone replacement therapy (postmenopausal): Secondary | ICD-10-CM

## 2012-10-13 DIAGNOSIS — M19049 Primary osteoarthritis, unspecified hand: Secondary | ICD-10-CM

## 2012-10-13 DIAGNOSIS — R109 Unspecified abdominal pain: Secondary | ICD-10-CM

## 2012-10-13 DIAGNOSIS — Z7189 Other specified counseling: Secondary | ICD-10-CM

## 2012-10-13 DIAGNOSIS — E039 Hypothyroidism, unspecified: Secondary | ICD-10-CM

## 2012-10-13 MED ORDER — SIMVASTATIN 20 MG PO TABS: 20.0000 mg | ORAL_TABLET | Freq: Every day | ORAL | Status: DC

## 2012-10-13 MED ORDER — SYNTHROID 100 MCG PO TABS: 100.0000 ug | ORAL_TABLET | Freq: Every day | ORAL | Status: DC

## 2012-10-13 NOTE — Progress Notes (Signed)
  Subjective:    Patient ID: Christina Myers, female    DOB: [birth year], 71 y.o.   MRN: [id number]  HPI Patient comes in today for follow up of  multiple medical problems.  Last visit was 11 12   Has lost weight wih change in eating to help.  Feels well but has some indgistion at times above tummy.   Takes  Gaviscon or tums Comes and goes  Week or 2.   Only ocass  Nsaid. Gaviscon  At night to prevent  Reflux had in past but not that much.  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 No fracture hx of daily exclusive diet Has lost weight with LS changes  Probemls with right hand thumb joint hurtsa bit at times no swelling  Plays tennis and somewhat problematic    Review of Systems NO fever chills cp sob pulm sx bleeding falling or balance memory problems Past history family history social history reviewed in the electronic medical record. Has not been abstracted at time of visit. reviewed  Centricity summary.     Objective:   Physical Exam BP 106/74  Pulse 66  Temp 98 F (36.7 C) (Oral)  SpO2 98%  Declines weight here WDWN in nad looks younger than stated age  HEENT: Normocephalic ;atraumatic , Eyes;  PERRL, EOMs  Full, lids and conjunctiva clear,,Ears: no deformities, canals nl, TM landmarks normal, Nose: no deformity or discharge  Mouth : OP clear without lesion or edema . Neck: Supple without adenopathy or masses or bruits Chest:  Clear to A&P without wheezes rales or rhonchi CV:  S1-S2 no gallops or murmurs peripheral perfusion is normal Abdomen:  Sof,t normal bowel sounds without hepatosplenomegaly, no guarding rebound or masses no CVA tenderness No clubbing cyanosis or edema Hands  Mild OA type changes no redness or effusion  Lab Results  Component Value Date   WBC 5.7 10/06/2012   HGB 14.7 10/06/2012   HCT 44.1 10/06/2012   PLT 254.0 10/06/2012    GLUCOSE 89 10/06/2012   CHOL 172 10/06/2012   TRIG 148.0 10/06/2012   HDL 55.80 10/06/2012   LDLDIRECT 158.5 11/17/2006   LDLCALC 87 10/06/2012   ALT 15 10/06/2012   AST 15 10/06/2012   NA 139 10/06/2012   K 4.1 10/06/2012   CL 106 10/06/2012   CREATININE 0.7 10/06/2012   BUN 15 10/06/2012   CO2 27 10/06/2012   TSH 0.91 10/06/2012   INR 1.0 ratio 11/27/2010        Assessment & Plan:  Thyroid  No change continue LIPIDS good levels no se of meds Abd discomfort   Poss  Gastritis  Acid related   Trial of prilosec otc for 2 weeks only and  Monitor sx  Fu if  persistent or progressive for  Poss other evaluation seems minor at this time but follow closely cause of age.  HRT and bone health calcium  Reviewed studies about ca supp and poss inc event is ones with est cv disease.  If can get in diet then dont need supplement  HO given today Hand pain  prob djd vs tendinitis has ortho consdier spind or SMed eval   About r hand  Murphy wainer  HCM  utd .  Total visit > 50% spent counseling and coordinating care

## 2012-10-13 NOTE — Patient Instructions (Addendum)
Continue healthy life style Calcium is best in foods  Add supplement  only if not able to get adequate calcium in your diet.  Continue vitamin d. If abd discomfort persists then add OTC Prilosec one a day 30 - 60 minutes before a meal for 2 weeks and then stop .   If  persistent or progressive plan follow up visit.    Preventive Care for Adults, Female A healthy lifestyle and preventive care can promote health and wellness. Preventive health guidelines for women include the following key practices.  A routine yearly physical is a good way to check with your caregiver about your health and preventive screening. It is a chance to share any concerns and updates on your health, and to receive a thorough exam.  Visit your dentist for a routine exam and preventive care every 6 months. Brush your teeth twice a day and floss once a day. Good oral hygiene prevents tooth decay and gum disease.  The frequency of eye exams is based on your age, health, family medical history, use of contact lenses, and other factors. Follow your caregiver's recommendations for frequency of eye exams.  Eat a healthy diet. Foods like vegetables, fruits, whole grains, low-fat dairy products, and lean protein foods contain the nutrients you need without too many calories. Decrease your intake of foods high in solid fats, added sugars, and salt. Eat the right amount of calories for you.Get information about a proper diet from your caregiver, if necessary.  Regular physical exercise is one of the most important things you can do for your health. Most adults should get at least 150 minutes of moderate-intensity exercise (any activity that increases your heart rate and causes you to sweat) each week. In addition, most adults need muscle-strengthening exercises on 2 or more days a week.  Maintain a healthy weight. The body mass index (BMI) is a screening tool to identify possible weight problems. It provides an estimate of body fat  based on height and weight. Your caregiver can help determine your BMI, and can help you achieve or maintain a healthy weight.For adults 20 years and older:  A BMI below 18.5 is considered underweight.  A BMI of 18.5 to 24.9 is normal.  A BMI of 25 to 29.9 is considered overweight.  A BMI of 30 and above is considered obese.  Maintain normal blood lipids and cholesterol levels by exercising and minimizing your intake of saturated fat. Eat a balanced diet with plenty of fruit and vegetables. Blood tests for lipids and cholesterol should begin at age 71 and be repeated every 5 years. If your lipid or cholesterol levels are high, you are over 50, or you are at high risk for heart disease, you may need your cholesterol levels checked more frequently.Ongoing high lipid and cholesterol levels should be treated with medicines if diet and exercise are not effective.  If you smoke, find out from your caregiver how to quit. If you do not use tobacco, do not start.  If you are pregnant, do not drink alcohol. If you are breastfeeding, be very cautious about drinking alcohol. If you are not pregnant and choose to drink alcohol, do not exceed 1 drink per day. One drink is considered to be 12 ounces (355 mL) of beer, 5 ounces (148 mL) of wine, or 1.5 ounces (44 mL) of liquor.  Avoid use of street drugs. Do not share needles with anyone. Ask for help if you need support or instructions about stopping the use  of drugs.  High blood pressure causes heart disease and increases the risk of stroke. Your blood pressure should be checked at least every 1 to 2 years. Ongoing high blood pressure should be treated with medicines if weight loss and exercise are not effective.  If you are 73 to 73 years old, ask your caregiver if you should take aspirin to prevent strokes.  Diabetes screening involves taking a blood sample to check your fasting blood sugar level. This should be done once every 3 years, after age 28, if  you are within normal weight and without risk factors for diabetes. Testing should be considered at a younger age or be carried out more frequently if you are overweight and have at least 1 risk factor for diabetes.  Breast cancer screening is essential preventive care for women. You should practice "breast self-awareness." This means understanding the normal appearance and feel of your breasts and may include breast self-examination. Any changes detected, no matter how small, should be reported to a caregiver. Women in their 108s and 30s should have a clinical breast exam (CBE) by a caregiver as part of a regular health exam every 1 to 3 years. After age 52, women should have a CBE every year. Starting at age 5, women should consider having a mammography (breast X-ray test) every year. Women who have a family history of breast cancer should talk to their caregiver about genetic screening. Women at a high risk of breast cancer should talk to their caregivers about having magnetic resonance imaging (MRI) and a mammography every year.  The Pap test is a screening test for cervical cancer. A Pap test can show cell changes on the cervix that might become cervical cancer if left untreated. A Pap test is a procedure in which cells are obtained and examined from the lower end of the uterus (cervix).  Women should have a Pap test starting at age 3.  Between ages 4 and 2, Pap tests should be repeated every 2 years.  Beginning at age 70, you should have a Pap test every 3 years as long as the past 3 Pap tests have been normal.  Some women have medical problems that increase the chance of getting cervical cancer. Talk to your caregiver about these problems. It is especially important to talk to your caregiver if a new problem develops soon after your last Pap test. In these cases, your caregiver may recommend more frequent screening and Pap tests.  The above recommendations are the same for women who have or  have not gotten the vaccine for human papillomavirus (HPV).  If you had a hysterectomy for a problem that was not cancer or a condition that could lead to cancer, then you no longer need Pap tests. Even if you no longer need a Pap test, a regular exam is a good idea to make sure no other problems are starting.  If you are between ages 56 and 34, and you have had normal Pap tests going back 10 years, you no longer need Pap tests. Even if you no longer need a Pap test, a regular exam is a good idea to make sure no other problems are starting.  If you have had past treatment for cervical cancer or a condition that could lead to cancer, you need Pap tests and screening for cancer for at least 20 years after your treatment.  If Pap tests have been discontinued, risk factors (such as a new sexual partner) need to be reassessed  to determine if screening should be resumed.  The HPV test is an additional test that may be used for cervical cancer screening. The HPV test looks for the virus that can cause the cell changes on the cervix. The cells collected during the Pap test can be tested for HPV. The HPV test could be used to screen women aged 7 years and older, and should be used in women of any age who have unclear Pap test results. After the age of 75, women should have HPV testing at the same frequency as a Pap test.  Colorectal cancer can be detected and often prevented. Most routine colorectal cancer screening begins at the age of 66 and continues through age 80. However, your caregiver may recommend screening at an earlier age if you have risk factors for colon cancer. On a yearly basis, your caregiver may provide home test kits to check for hidden blood in the stool. Use of a small camera at the end of a tube, to directly examine the colon (sigmoidoscopy or colonoscopy), can detect the earliest forms of colorectal cancer. Talk to your caregiver about this at age 62, when routine screening begins. Direct  examination of the colon should be repeated every 5 to 10 years through age 83, unless early forms of pre-cancerous polyps or small growths are found.  Hepatitis C blood testing is recommended for all people born from [birth year] through 1965 and any individual with known risks for hepatitis C.  Practice safe sex. Use condoms and avoid high-risk sexual practices to reduce the spread of sexually transmitted infections (STIs). STIs include gonorrhea, chlamydia, syphilis, trichomonas, herpes, HPV, and human immunodeficiency virus (HIV). Herpes, HIV, and HPV are viral illnesses that have no cure. They can result in disability, cancer, and death. Sexually active women aged 1 and younger should be checked for chlamydia. Older women with new or multiple partners should also be tested for chlamydia. Testing for other STIs is recommended if you are sexually active and at increased risk.  Osteoporosis is a disease in which the bones lose minerals and strength with aging. This can result in serious bone fractures. The risk of osteoporosis can be identified using a bone density scan. Women ages 76 and over and women at risk for fractures or osteoporosis should discuss screening with their caregivers. Ask your caregiver whether you should take a calcium supplement or vitamin D to reduce the rate of osteoporosis.  Menopause can be associated with physical symptoms and risks. Hormone replacement therapy is available to decrease symptoms and risks. You should talk to your caregiver about whether hormone replacement therapy is right for you.  Use sunscreen with sun protection factor (SPF) of 30 or more. Apply sunscreen liberally and repeatedly throughout the day. You should seek shade when your shadow is shorter than you. Protect yourself by wearing long sleeves, pants, a wide-brimmed hat, and sunglasses year round, whenever you are outdoors.  Once a month, do a whole body skin exam, using a mirror to look at the skin on your  back. Notify your caregiver of new moles, moles that have irregular borders, moles that are larger than a pencil eraser, or moles that have changed in shape or color.  Stay current with required immunizations.  Influenza. You need a dose every fall (or winter). The composition of the flu vaccine changes each year, so being vaccinated once is not enough.  Pneumococcal polysaccharide. You need 1 to 2 doses if you smoke cigarettes or if you have certain  chronic medical conditions. You need 1 dose at age 47 (or older) if you have never been vaccinated.  Tetanus, diphtheria, pertussis (Tdap, Td). Get 1 dose of Tdap vaccine if you are younger than age 63, are over 36 and have contact with an infant, are a Research scientist (physical sciences), are pregnant, or simply want to be protected from whooping cough. After that, you need a Td booster dose every 10 years. Consult your caregiver if you have not had at least 3 tetanus and diphtheria-containing shots sometime in your life or have a deep or dirty wound.  HPV. You need this vaccine if you are a woman age 37 or younger. The vaccine is given in 3 doses over 6 months.  Measles, mumps, rubella (MMR). You need at least 1 dose of MMR if you were born in 1957 or later. You may also need a second dose.  Meningococcal. If you are age 70 to 93 and a first-year college student living in a residence hall, or have one of several medical conditions, you need to get vaccinated against meningococcal disease. You may also need additional booster doses.  Zoster (shingles). If you are age 52 or older, you should get this vaccine.  Varicella (chickenpox). If you have never had chickenpox or you were vaccinated but received only 1 dose, talk to your caregiver to find out if you need this vaccine.  Hepatitis A. You need this vaccine if you have a specific risk factor for hepatitis A virus infection or you simply wish to be protected from this disease. The vaccine is usually given as 2 doses,  6 to 18 months apart.  Hepatitis B. You need this vaccine if you have a specific risk factor for hepatitis B virus infection or you simply wish to be protected from this disease. The vaccine is given in 3 doses, usually over 6 months. Preventive Services / Frequency Ages 74 to 50  Blood pressure check.** / Every 1 to 2 years.  Lipid and cholesterol check.** / Every 5 years beginning at age 16.  Clinical breast exam.** / Every 3 years for women in their 50s and 30s.  Pap test.** / Every 2 years from ages 70 through 91. Every 3 years starting at age 34 through age 39 or 52 with a history of 3 consecutive normal Pap tests.  HPV screening.** / Every 3 years from ages 33 through ages 21 to 31 with a history of 3 consecutive normal Pap tests.  Hepatitis C blood test.** / For any individual with known risks for hepatitis C.  Skin self-exam. / Monthly.  Influenza immunization.** / Every year.  Pneumococcal polysaccharide immunization.** / 1 to 2 doses if you smoke cigarettes or if you have certain chronic medical conditions.  Tetanus, diphtheria, pertussis (Tdap, Td) immunization. / A one-time dose of Tdap vaccine. After that, you need a Td booster dose every 10 years.  HPV immunization. / 3 doses over 6 months, if you are 51 and younger.  Measles, mumps, rubella (MMR) immunization. / You need at least 1 dose of MMR if you were born in 1957 or later. You may also need a second dose.  Meningococcal immunization. / 1 dose if you are age 91 to 70 and a first-year college student living in a residence hall, or have one of several medical conditions, you need to get vaccinated against meningococcal disease. You may also need additional booster doses.  Varicella immunization.** / Consult your caregiver.  Hepatitis A immunization.** / Consult your caregiver.  2 doses, 6 to 18 months apart.  Hepatitis B immunization.** / Consult your caregiver. 3 doses usually over 6 months. Ages 50 to  13  Blood pressure check.** / Every 1 to 2 years.  Lipid and cholesterol check.** / Every 5 years beginning at age 38.  Clinical breast exam.** / Every year after age 63.  Mammogram.** / Every year beginning at age 72 and continuing for as long as you are in good health. Consult with your caregiver.  Pap test.** / Every 3 years starting at age 60 through age 8 or 107 with a history of 3 consecutive normal Pap tests.  HPV screening.** / Every 3 years from ages 47 through ages 7 to 17 with a history of 3 consecutive normal Pap tests.  Fecal occult blood test (FOBT) of stool. / Every year beginning at age 42 and continuing until age 70. You may not need to do this test if you get a colonoscopy every 10 years.  Flexible sigmoidoscopy or colonoscopy.** / Every 5 years for a flexible sigmoidoscopy or every 10 years for a colonoscopy beginning at age 18 and continuing until age 43.  Hepatitis C blood test.** / For all people born from [birth year] through 1967 and any individual with known risks for hepatitis C.  Skin self-exam. / Monthly.  Influenza immunization.** / Every year.  Pneumococcal polysaccharide immunization.** / 1 to 2 doses if you smoke cigarettes or if you have certain chronic medical conditions.  Tetanus, diphtheria, pertussis (Tdap, Td) immunization.** / A one-time dose of Tdap vaccine. After that, you need a Td booster dose every 10 years.  Measles, mumps, rubella (MMR) immunization. / You need at least 1 dose of MMR if you were born in 1957 or later. You may also need a second dose.  Varicella immunization.** / Consult your caregiver.  Meningococcal immunization.** / Consult your caregiver.  Hepatitis A immunization.** / Consult your caregiver. 2 doses, 6 to 18 months apart.  Hepatitis B immunization.** / Consult your caregiver. 3 doses, usually over 6 months. Ages 24 and over  Blood pressure check.** / Every 1 to 2 years.  Lipid and cholesterol check.** / Every 5 years  beginning at age 38.  Clinical breast exam.** / Every year after age 14.  Mammogram.** / Every year beginning at age 63 and continuing for as long as you are in good health. Consult with your caregiver.  Pap test.** / Every 3 years starting at age 43 through age 27 or 55 with a 3 consecutive normal Pap tests. Testing can be stopped between 65 and 70 with 3 consecutive normal Pap tests and no abnormal Pap or HPV tests in the past 10 years.  HPV screening.** / Every 3 years from ages 58 through ages 16 or 40 with a history of 3 consecutive normal Pap tests. Testing can be stopped between 65 and 70 with 3 consecutive normal Pap tests and no abnormal Pap or HPV tests in the past 10 years.  Fecal occult blood test (FOBT) of stool. / Every year beginning at age 48 and continuing until age 42. You may not need to do this test if you get a colonoscopy every 10 years.  Flexible sigmoidoscopy or colonoscopy.** / Every 5 years for a flexible sigmoidoscopy or every 10 years for a colonoscopy beginning at age 60 and continuing until age 59.  Hepatitis C blood test.** / For all people born from [birth year] through 1967 and any individual with known risks for hepatitis C.  Osteoporosis screening.** / A one-time screening for women ages 23 and over and women at risk for fractures or osteoporosis.  Skin self-exam. / Monthly.  Influenza immunization.** / Every year.  Pneumococcal polysaccharide immunization.** / 1 dose at age 73 (or older) if you have never been vaccinated.  Tetanus, diphtheria, pertussis (Tdap, Td) immunization. / A one-time dose of Tdap vaccine if you are over 65 and have contact with an infant, are a Research scientist (physical sciences), or simply want to be protected from whooping cough. After that, you need a Td booster dose every 10 years.  Varicella immunization.** / Consult your caregiver.  Meningococcal immunization.** / Consult your caregiver.  Hepatitis A immunization.** / Consult your caregiver. 2  doses, 6 to 18 months apart.  Hepatitis B immunization.** / Check with your caregiver. 3 doses, usually over 6 months. ** Family history and personal history of risk and conditions may change your caregiver's recommendations. Document Released: 02/03/2002 Document Revised: 03/01/2012 Document Reviewed: 05/05/2011 [facility] Patient Information 2013 [place], [place].

## 2012-10-16 ENCOUNTER — Encounter: Payer: Self-pay | Admitting: Internal Medicine

## 2013-01-03 LAB — HM PAP SMEAR

## 2013-01-31 ENCOUNTER — Other Ambulatory Visit: Payer: Self-pay | Admitting: Gynecology

## 2013-01-31 DIAGNOSIS — Z1231 Encounter for screening mammogram for malignant neoplasm of breast: Secondary | ICD-10-CM

## 2013-02-22 ENCOUNTER — Ambulatory Visit
Admission: RE | Admit: 2013-02-22 | Discharge: 2013-02-22 | Disposition: A | Discharge status: Home / Self Care | Payer: Medicare Other | Source: Ambulatory Visit | Attending: Gynecology | Admitting: Gynecology

## 2013-02-22 DIAGNOSIS — Z1231 Encounter for screening mammogram for malignant neoplasm of breast: Secondary | ICD-10-CM

## 2013-02-23 ENCOUNTER — Other Ambulatory Visit: Payer: Self-pay | Admitting: Gynecology

## 2013-02-23 DIAGNOSIS — R928 Other abnormal and inconclusive findings on diagnostic imaging of breast: Secondary | ICD-10-CM

## 2013-03-08 ENCOUNTER — Ambulatory Visit
Admission: RE | Admit: 2013-03-08 | Discharge: 2013-03-08 | Disposition: A | Discharge status: Home / Self Care | Payer: Medicare Other | Source: Ambulatory Visit | Attending: Gynecology | Admitting: Gynecology

## 2013-03-08 DIAGNOSIS — R928 Other abnormal and inconclusive findings on diagnostic imaging of breast: Secondary | ICD-10-CM

## 2013-10-20 ENCOUNTER — Telehealth: Payer: Self-pay | Admitting: Internal Medicine

## 2013-10-20 NOTE — Telephone Encounter (Signed)
Pt request labs prior pt her scheduling a fu yearly appt. Pt did this last year. Pt does not want a cpe, but she does want these labs prior to coming in. pls advise

## 2013-10-24 NOTE — Telephone Encounter (Signed)
Ok to order lipid, lfts , tsh, bmp  Dx elevated lipids, hypothyroid ,( not getting blood count as it has been normal in the past)

## 2013-10-25 ENCOUNTER — Other Ambulatory Visit: Payer: Self-pay | Admitting: Family Medicine

## 2013-10-25 DIAGNOSIS — E785 Hyperlipidemia, unspecified: Secondary | ICD-10-CM

## 2013-10-25 DIAGNOSIS — E039 Hypothyroidism, unspecified: Secondary | ICD-10-CM

## 2013-10-25 NOTE — Telephone Encounter (Signed)
Pt aware appt made

## 2013-10-25 NOTE — Telephone Encounter (Signed)
I have placed the orders in the system.  Please call the pt and make a lab appt.  Thanks!

## 2013-10-26 ENCOUNTER — Other Ambulatory Visit (INDEPENDENT_AMBULATORY_CARE_PROVIDER_SITE_OTHER): Payer: Medicare Other

## 2013-10-26 DIAGNOSIS — E785 Hyperlipidemia, unspecified: Secondary | ICD-10-CM

## 2013-10-26 DIAGNOSIS — E039 Hypothyroidism, unspecified: Secondary | ICD-10-CM

## 2013-10-26 LAB — HEPATIC FUNCTION PANEL
Bilirubin, Direct: 0 mg/dL (ref 0.0–0.3)
Total Bilirubin: 0.6 mg/dL (ref 0.3–1.2)
Total Protein: 6.5 g/dL (ref 6.0–8.3)

## 2013-10-26 LAB — BASIC METABOLIC PANEL
BUN: 17 mg/dL (ref 6–23)
Chloride: 106 mEq/L (ref 96–112)
Glucose, Bld: 86 mg/dL (ref 70–99)
Potassium: 4 mEq/L (ref 3.5–5.1)

## 2013-10-26 LAB — LIPID PANEL
LDL Cholesterol: 107 mg/dL — ABNORMAL HIGH (ref 0–99)
VLDL: 28 mg/dL (ref 0.0–40.0)

## 2013-10-28 ENCOUNTER — Encounter: Payer: Self-pay | Admitting: Internal Medicine

## 2013-10-28 ENCOUNTER — Ambulatory Visit (INDEPENDENT_AMBULATORY_CARE_PROVIDER_SITE_OTHER): Payer: Medicare Other | Admitting: Internal Medicine

## 2013-10-28 VITALS — BP 104/60 | HR 55 | Temp 97.7°F

## 2013-10-28 DIAGNOSIS — Z79890 Hormone replacement therapy (postmenopausal): Secondary | ICD-10-CM

## 2013-10-28 DIAGNOSIS — E782 Mixed hyperlipidemia: Secondary | ICD-10-CM

## 2013-10-28 DIAGNOSIS — R252 Cramp and spasm: Secondary | ICD-10-CM

## 2013-10-28 DIAGNOSIS — E039 Hypothyroidism, unspecified: Secondary | ICD-10-CM

## 2013-10-28 DIAGNOSIS — Z23 Encounter for immunization: Secondary | ICD-10-CM

## 2013-10-28 LAB — TSH: TSH: 0.62 u[IU]/mL (ref 0.35–5.50)

## 2013-10-28 MED ORDER — SIMVASTATIN 20 MG PO TABS: 20.0000 mg | ORAL_TABLET | Freq: Every day | ORAL | Status: DC

## 2013-10-28 MED ORDER — SYNTHROID 100 MCG PO TABS: 100.0000 ug | ORAL_TABLET | Freq: Every day | ORAL | Status: DC

## 2013-10-28 NOTE — Patient Instructions (Signed)
Continue lifestyle intervention healthy eating and exercise . You will be contacted   About thyroid level.  And send in refills.

## 2013-10-28 NOTE — Progress Notes (Signed)
Chief Complaint  Patient presents with  . Follow-up  . Hyperlipidemia  . Hypothyroidism    HPI: Here for yearly visit  Medication  Since last visit is generally well   Torn rotator cuff  Right  Remote hx of surgery.   Using antiinflammatory  And ocass cortisone shot.  [place].  Arthritis  In neck.   Still tries to play tennis.  Ok to do for ROM  .    Thyroid: no change in meds doing well needs refill on brand  LIPIDS: nose of meds  although  ges ocass nocturnal night foot leg cramps relieved by strreching  HRT: Low dose patch  And had little hot flushes and slept better on hormone sleep some better and now taking. Melatonin also   OA right hand   Had small BCCA ion nose treated topically and has fu in jan   Has some pain bottom of foot at times   Teaches dance tap etc  Still weekly.    hh of 2  Pet cat died this year.  No falling.  No depression   ROS: See pertinent positives and negatives per HPI. Neg cv pulm gi gu  Past Medical History  Diagnosis Date  . Hx of colonic polyps   . Hypertension   . Hypothyroidism   . Seasonal allergies   . Skin cancer     hx of bcca of face  . PVC (premature ventricular contraction)     [facility] evaluation 7/03  . SKIN CANCER, HX OF 02/02/2008    Qualifier: Diagnosis of  By: Fabian Sharp MD, Neta Mends OSTEOARTHRITIS, HAND 04/24/2009    Qualifier: Diagnosis of  By: Fabian Sharp MD, Neta Mends COLONIC POLYPS, HX OF 08/30/2007    Qualifier: Diagnosis of  By: Claiborne Billings CMA, Jacqualynn      Family History  Problem Relation Age of Onset  . Cancer Other     colon, prostate   . Diabetes Other   . Stroke Other   . Heart disease Other     History   Social History  . Marital Status: Married    Spouse Name: N/A    Number of Children: N/A  . Years of Education: N/A   Social History Main Topics  . Smoking status: Former Games developer  . Smokeless tobacco: None  . Alcohol Use: Yes     Comment: rarely  . Drug Use: No  . Sexual Activity: None    Other Topics Concern  . None   Social History Narrative   Married    Former smoker   Games developer and teaches tap dancing.    No etoh   HH of 2     Outpatient Encounter Prescriptions as of 10/28/2013  Medication Sig  . aspirin 81 MG tablet Take 81 mg by mouth daily.    [name] BIOTIN PO Take by mouth.  . cholecalciferol (VITAMIN D) 1000 UNITS tablet Take 1,000 Units by mouth daily.    . Coenzyme Q10 (CO Q-10) 100 MG CAPS Take by mouth.  . estradiol (CLIMARA - DOSED IN MG/24 HR) 0.025 mg/24hr patch Place 0.025 mg onto the skin once a week.  . estradiol (ESTRACE) 0.1 MG/GM vaginal cream Place 2 g vaginally daily.    . Magnesium 250 MG TABS Take by mouth.  . nabumetone (RELAFEN) 500 MG tablet Take 500 mg by mouth 2 (two) times daily. With food  . Probiotic Product (PHILLIPS COLON HEALTH PO) Take by mouth.  . progesterone (  PROMETRIUM) 200 MG capsule 12 days semi-annually  . simvastatin (ZOCOR) 20 MG tablet Take 1 tablet (20 mg total) by mouth at bedtime.  [name] SYNTHROID 100 MCG tablet Take 1 tablet (100 mcg total) by mouth daily.  . [DISCONTINUED] estradiol (VIVELLE-DOT) 0.025 MG/24HR Place 1 patch onto the skin 2 (two) times a week.    . [DISCONTINUED] simvastatin (ZOCOR) 20 MG tablet Take 1 tablet (20 mg total) by mouth at bedtime.  . [DISCONTINUED] SYNTHROID 100 MCG tablet Take 1 tablet (100 mcg total) by mouth daily.    EXAM:  BP 104/60  Pulse 55  Temp(Src) 97.7 F (36.5 C) (Oral)  SpO2 99%  Cannot calculate BMI with a height equal to zero.  GENERAL: vitals reviewed and listed above, alert, oriented, appears well hydrated and in no acute distress  HEENT: atraumatic, conjunctiva  clear, no obvious abnormalities on inspection of external nose and ears  tms clear OP : no lesion edema or exudate  NECK: no obvious masses on inspection palpation  No bruits adenipathy LUNGS: clear to auscultation bilaterally, no wheezes, rales or rhonchi, good air movement CV: HRRR, no clubbing  cyanosis or  peripheral edema nl cap refill  Abdomen:  Sof,t normal bowel sounds without hepatosplenomegaly, no guarding rebound or masses no CVA tenderness MS: moves all extremities without noticeable focal  Abnormality OA  changes right hand   PSYCH: pleasant and cooperative, no obvious depression or anxiety Lab Results  Component Value Date   WBC 5.7 10/06/2012   HGB 14.7 10/06/2012   HCT 44.1 10/06/2012   PLT 254.0 10/06/2012   GLUCOSE 86 10/26/2013   CHOL 188 10/26/2013   TRIG 140.0 10/26/2013   HDL 53.20 10/26/2013   LDLDIRECT 158.5 11/17/2006   LDLCALC 107* 10/26/2013   ALT 17 10/26/2013   AST 15 10/26/2013   NA 139 10/26/2013   K 4.0 10/26/2013   CL 106 10/26/2013   CREATININE 0.7 10/26/2013   BUN 17 10/26/2013   CO2 27 10/26/2013   TSH 0.62 10/26/2013   INR 1.0 ratio 11/27/2010    ASSESSMENT AND PLAN:  Discussed the following assessment and plan:  HYPOTHYROIDISM  HYPERLIPIDEMIA  Postmenopausal HRT (hormone replacement therapy)  Need for prophylactic vaccination and inoculation against influenza - Plan: Flu vaccine HIGH DOSE PF (Fluzone Tri High dose)  Nocturnal foot cramps Take mealtonin earlier in evening doubnt if leg cramps from statin can try off if wishes  Labs good today  -Patient advised to return or notify health care team  if symptoms worsen or persist or new concerns arise.  Patient Instructions  Continue lifestyle intervention healthy eating and exercise . You will be contacted   About thyroid level.  And send in refills.    Neta Mends. Panosh M.D. Total visit > 50% spent counseling and coordinating care

## 2014-02-28 ENCOUNTER — Other Ambulatory Visit: Payer: Self-pay

## 2014-02-28 DIAGNOSIS — Z1231 Encounter for screening mammogram for malignant neoplasm of breast: Secondary | ICD-10-CM

## 2014-03-16 ENCOUNTER — Ambulatory Visit
Admission: RE | Admit: 2014-03-16 | Discharge: 2014-03-16 | Disposition: A | Discharge status: Home / Self Care | Payer: Medicare Other | Source: Ambulatory Visit

## 2014-03-16 DIAGNOSIS — Z1231 Encounter for screening mammogram for malignant neoplasm of breast: Secondary | ICD-10-CM

## 2014-10-06 ENCOUNTER — Telehealth: Payer: Self-pay | Admitting: Internal Medicine

## 2014-10-06 DIAGNOSIS — E039 Hypothyroidism, unspecified: Secondary | ICD-10-CM

## 2014-10-06 DIAGNOSIS — Z79899 Other long term (current) drug therapy: Secondary | ICD-10-CM

## 2014-10-06 DIAGNOSIS — E782 Mixed hyperlipidemia: Secondary | ICD-10-CM

## 2014-10-06 NOTE — Telephone Encounter (Signed)
Pt request labs prior pt her scheduling a fu yearly appt. Pt did this last year (and the year before) Pt does not want a cpe, but she does want these labs prior to coming in. pls advise

## 2014-10-09 NOTE — Telephone Encounter (Signed)
lmon vm to cb and sch labs

## 2014-10-09 NOTE — Telephone Encounter (Signed)
lab orders placed for lipid bmp tsh and lfts Future  Ok to get labs before visit

## 2014-10-09 NOTE — Telephone Encounter (Signed)
Pt has been scheduled.  °

## 2014-10-15 ENCOUNTER — Other Ambulatory Visit: Payer: Self-pay | Admitting: Internal Medicine

## 2014-10-16 NOTE — Telephone Encounter (Signed)
Pt has an appt scheduled for 10/24/14.  Rx sent to the pharmacy by e-scribe.

## 2014-10-18 ENCOUNTER — Other Ambulatory Visit (INDEPENDENT_AMBULATORY_CARE_PROVIDER_SITE_OTHER): Payer: Medicare Other

## 2014-10-18 DIAGNOSIS — E039 Hypothyroidism, unspecified: Secondary | ICD-10-CM

## 2014-10-18 DIAGNOSIS — Z79899 Other long term (current) drug therapy: Secondary | ICD-10-CM

## 2014-10-18 DIAGNOSIS — E782 Mixed hyperlipidemia: Secondary | ICD-10-CM

## 2014-10-18 LAB — LDL CHOLESTEROL, DIRECT: Direct LDL: 92.3 mg/dL

## 2014-10-18 LAB — HEPATIC FUNCTION PANEL
ALBUMIN: 3.4 g/dL — AB (ref 3.5–5.2)
ALT: 14 U/L (ref 0–35)
AST: 16 U/L (ref 0–37)
Alkaline Phosphatase: 65 U/L (ref 39–117)
BILIRUBIN TOTAL: 0.6 mg/dL (ref 0.2–1.2)
Bilirubin, Direct: 0 mg/dL (ref 0.0–0.3)
Total Protein: 6.4 g/dL (ref 6.0–8.3)

## 2014-10-18 LAB — LIPID PANEL
CHOL/HDL RATIO: 4
CHOLESTEROL: 170 mg/dL (ref 0–200)
HDL: 45.4 mg/dL (ref >39.00)
NonHDL: 124.6
Triglycerides: 231 mg/dL — ABNORMAL HIGH (ref 0.0–149.0)
VLDL: 46.2 mg/dL — AB (ref 0.0–40.0)

## 2014-10-18 LAB — TSH: TSH: 0.96 u[IU]/mL (ref 0.35–4.50)

## 2014-10-18 LAB — BASIC METABOLIC PANEL
BUN: 11 mg/dL (ref 6–23)
CALCIUM: 8.8 mg/dL (ref 8.4–10.5)
CO2: 24 mEq/L (ref 19–32)
Chloride: 107 mEq/L (ref 96–112)
Creatinine, Ser: 0.7 mg/dL (ref 0.4–1.2)
GFR: 81.24 mL/min (ref >60.00)
GLUCOSE: 84 mg/dL (ref 70–99)
POTASSIUM: 3.7 meq/L (ref 3.5–5.1)
SODIUM: 141 meq/L (ref 135–145)

## 2014-10-20 ENCOUNTER — Other Ambulatory Visit: Payer: Self-pay | Admitting: Internal Medicine

## 2014-10-20 NOTE — Telephone Encounter (Signed)
Sent to the pharmacy by e-scribe.  Pt has upcoming appt on 10/24/14

## 2014-10-24 ENCOUNTER — Encounter: Payer: Self-pay | Admitting: Internal Medicine

## 2014-10-24 ENCOUNTER — Ambulatory Visit (INDEPENDENT_AMBULATORY_CARE_PROVIDER_SITE_OTHER): Payer: Medicare Other | Admitting: Internal Medicine

## 2014-10-24 VITALS — BP 106/62 | HR 68 | Temp 98.1°F | Ht 61.25 in

## 2014-10-24 DIAGNOSIS — E782 Mixed hyperlipidemia: Secondary | ICD-10-CM

## 2014-10-24 DIAGNOSIS — Z79890 Hormone replacement therapy (postmenopausal): Secondary | ICD-10-CM

## 2014-10-24 DIAGNOSIS — E039 Hypothyroidism, unspecified: Secondary | ICD-10-CM

## 2014-10-24 DIAGNOSIS — Z23 Encounter for immunization: Secondary | ICD-10-CM

## 2014-10-24 MED ORDER — SYNTHROID 100 MCG PO TABS: ORAL_TABLET | ORAL | Status: DC

## 2014-10-24 NOTE — Patient Instructions (Signed)
Thyroid is normal range . Triglycerides  are up some could be related to decrease activity . intensify activity    Walking etc to substitute.  consider a pedometer and look at phone apps.  Continue to have Dr.  [name] address the GI issue.     Food Choices to Lower Your Triglycerides  Triglycerides are a type of fat in your blood. High levels of triglycerides can increase the risk of heart disease and stroke. If your triglyceride levels are high, the foods you eat and your eating habits are very important. Choosing the right foods can help lower your triglycerides.  WHAT GENERAL GUIDELINES DO I NEED TO FOLLOW?  Lose weight if you are overweight.   Limit or avoid alcohol.   Fill one half of your plate with vegetables and green salads.   Limit fruit to two servings a day. Choose fruit instead of juice.   Make one fourth of your plate whole grains. Look for the word "whole" as the first word in the ingredient list.  Fill one fourth of your plate with lean protein foods.  Enjoy fatty fish (such as salmon, mackerel, sardines, and tuna) three times a week.   Choose healthy fats.   Limit foods high in starch and sugar.  Eat more home-cooked food and less restaurant, buffet, and fast food.  Limit fried foods.  Cook foods using methods other than frying.  Limit saturated fats.  Check ingredient lists to avoid foods with partially hydrogenated oils (trans fats) in them. WHAT FOODS CAN I EAT?  Grains Whole grains, such as whole wheat or whole grain breads, crackers, cereals, and pasta. Unsweetened oatmeal, bulgur, barley, quinoa, or brown rice. Corn or whole wheat flour tortillas.  Vegetables Fresh or frozen vegetables (raw, steamed, roasted, or grilled). Green salads. Fruits All fresh, canned (in natural juice), or frozen fruits. Meat and Other Protein Products Ground beef (85% or leaner), grass-fed beef, or beef trimmed of fat. Skinless chicken or [place]. Ground chicken or  [place]. Pork trimmed of fat. All fish and seafood. Eggs. Dried beans, peas, or lentils. Unsalted nuts or seeds. Unsalted canned or dry beans. Dairy Low-fat dairy products, such as skim or 1% milk, 2% or reduced-fat cheeses, low-fat ricotta or cottage cheese, or plain low-fat yogurt. Fats and Oils Tub margarines without trans fats. Light or reduced-fat mayonnaise and salad dressings. Avocado. Safflower, olive, or canola oils. Natural peanut or almond butter. The items listed above may not be a complete list of recommended foods or beverages. Contact your dietitian for more options. WHAT FOODS ARE NOT RECOMMENDED?  Grains White bread. White pasta. White rice. Cornbread. Bagels, pastries, and croissants. Crackers that contain trans fat. Vegetables White potatoes. Corn. Creamed or fried vegetables. Vegetables in a cheese sauce. Fruits Dried fruits. Canned fruit in light or heavy syrup. Fruit juice. Meat and Other Protein Products Fatty cuts of meat. Ribs, chicken wings, bacon, sausage, bologna, salami, chitterlings, fatback, hot dogs, bratwurst, and packaged luncheon meats. Dairy Whole or 2% milk, cream, half-and-half, and cream cheese. Whole-fat or sweetened yogurt. Full-fat cheeses. Nondairy creamers and whipped toppings. Processed cheese, cheese spreads, or cheese curds. Sweets and Desserts Corn syrup, sugars, honey, and molasses. Candy. Jam and jelly. Syrup. Sweetened cereals. Cookies, pies, cakes, donuts, muffins, and ice cream. Fats and Oils Butter, stick margarine, lard, shortening, ghee, or bacon fat. Coconut, palm kernel, or palm oils. Beverages Alcohol. Sweetened drinks (such as sodas, lemonade, and fruit drinks or punches). The items listed above may not be  a complete list of foods and beverages to avoid. Contact your dietitian for more information. Document Released: 09/25/2004 Document Revised: 12/13/2013 Document Reviewed: 10/12/2013 [facility] Patient Information 2015 [place],  [place]. This information is not intended to replace advice given to you by your health care provider. Make sure you discuss any questions you have with your health care provider.

## 2014-10-24 NOTE — Progress Notes (Signed)
Pre visit review using our clinic review tool, if applicable. No additional management support is needed unless otherwise documented below in the visit note.  Chief Complaint  Patient presents with  . Follow-up    yearly   . Hypothyroidism  . Hyperlipidemia    HPI: Christina Myers 73 y.o. 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  Trying to walk more. It isn't as active  Digestive issues   Sometimes  Off for 5 days . And then has frequent bowel movements and urgency and has to take Imodium is working with  Dr Christina Myers   Oct 9th . Constipation  Stopped n said for shoulder  And mag supplement to use benefiber q d .   Lactose free . Diet .  Husband had heart attack the summer 2 stents he is eating healthier. She's taking Synthroid branded as recommended by her initial physician throughout the life however her insurance plan may require her to change to generic unless there is an appeal. She doesn't have any more information about this would per prefer to stay on the Synthroid which she was told initially. ROS: See pertinent positives and negatives per HPI.no bruising bleeding falling negative chest pain shortness of breath numbness GU problems GI as above rest noncontributory she is on hormone replacement therapy as per Christina Myers  LIFESTYLE:  Exercise:  Yes dance and walk Tobacco/ETS:no Alcohol: not much  Sugar beverages:no Sleep:ok  Drug use: no Reported utd  Colonoscopy:    Past Medical History  Diagnosis Date  . Hx of colonic polyps   . Hypertension   . Hypothyroidism   . Seasonal allergies   . Skin cancer     hx of bcca of face  . PVC (premature ventricular contraction)     [facility] evaluation 7/03  . SKIN CANCER, HX OF 02/02/2008    Qualifier: Diagnosis of  By: Christina Bill MD, Christina Myers, Christina Myers 04/24/2009    Qualifier: Diagnosis of  By: Christina Bill MD, [place], HX OF 08/30/2007    Qualifier: Diagnosis of  By: Christina Myers Myers, Christina Myers      Family History  Problem Relation Age of Onset  . Cancer Other     colon, prostate   . Diabetes Other   . Stroke Other   . Heart disease Other     History   Social History  . Marital Status: Married    Spouse Name: N/A    Number of Children: N/A  . Years of Education: N/A   Social History Main Topics  . Smoking status: Former Research scientist (life sciences)  . Smokeless tobacco: None  . Alcohol Use: Yes     Comment: rarely  . Drug Use: No  . Sexual Activity: None   Other Topics Concern  . None   Social History Narrative   Married    Former smoker   Set designer and teaches tap dancing.    No etoh   HH of 2     Outpatient Encounter Prescriptions as of 10/24/2014  Medication Sig  . aspirin 81 MG tablet Take 81 mg by mouth daily.    [name] BIOTIN PO Take by mouth.  . cholecalciferol (VITAMIN D) 1000 UNITS tablet Take 1,000 Units by mouth daily.    . Coenzyme Q10 (CO Q-10) 100 MG CAPS Take by mouth.  . estradiol (CLIMARA -  DOSED IN MG/24 HR) 0.025 mg/24hr patch Place 0.025 mg onto the skin once a week.  . estradiol (ESTRACE) 0.1 MG/GM vaginal cream Place 2 g vaginally daily.    . Magnesium 250 MG TABS Take by mouth.  . nabumetone (RELAFEN) 500 MG tablet Take 500 mg by mouth 2 (two) times daily. With food  . Probiotic Product (Lindsey) Take by mouth.  . progesterone (PROMETRIUM) 200 MG capsule 12 days semi-annually  . simvastatin (ZOCOR) 20 MG tablet TAKE 1 TABLET BY MOUTH EVERY DAY AT BEDTIME  . SYNTHROID 100 MCG tablet TAKE 1 TABLET BY MOUTH EVERY DAY  . [DISCONTINUED] SYNTHROID 100 MCG tablet TAKE 1 TABLET BY MOUTH EVERY DAY    EXAM:  BP 106/62 mmHg  Pulse 68  Temp(Src) 98.1 F (36.7 C) (Oral)  Ht 5' 1.25" (1.556 m)  Wt   SpO2 96%  Body mass index is 0.00 kg/(m^2).  GENERAL: vitals reviewed and listed above, alert, oriented, appears well hydrated ayounger than stated agend  in no acute distress HEENT: atraumatic, conjunctiva  clear, no obvious abnormalities on inspection of external nose and ears OP : no lesion edema or exudate  NECK: no obvious masses on inspection palpation  LUNGS: clear to auscultation bilaterally, no wheezes, rales or rhonchi, good air movement CV: HRRR, no clubbing cyanosis or  peripheral edema nl cap refill  Abdomen:  Sof,t normal bowel sounds without hepatosplenomegaly, no guarding rebound or masses no CVA tenderness MS: moves all extremities without noticeable focal  abnormality PSYCH: pleasant and cooperative, no obvious depression or anxiety Lab Results  Component Value Date   WBC 5.7 10/06/2012   HGB 14.7 10/06/2012   HCT 44.1 10/06/2012   PLT 254.0 10/06/2012   GLUCOSE 84 10/18/2014   CHOL 170 10/18/2014   TRIG 231.0* 10/18/2014   HDL 45.40 10/18/2014   LDLDIRECT 92.3 10/18/2014   LDLCALC 107* 10/26/2013   ALT 14 10/18/2014   AST 16 10/18/2014   NA 141 10/18/2014   K 3.7 10/18/2014   CL 107 10/18/2014   CREATININE 0.7 10/18/2014   BUN 11 10/18/2014   CO2 24 10/18/2014   TSH 0.96 10/18/2014   INR 1.0 ratio 11/27/2010    ASSESSMENT AND PLAN:  Discussed the following assessment and plan:  Hypothyroidism, unspecified hypothyroidism type - tsh in range continue same med  HYPERLIPIDEMIA - tg 231 ldl 92  Postmenopausal HRT (hormone replacement therapy) - low-dose no change done think that is affecting the triglycerides dramatically from last year  Need for prophylactic vaccination and inoculation against influenza - Plan: Flu vaccine HIGH DOSE PF (Fluzone Tri High dose)  Need for vaccination with 13-polyvalent pneumococcal conjugate vaccine - Plan: Pneumococcal conjugate vaccine 13-valent  -Patient advised to return or notify health care team  if symptoms worsen ,persist or new concerns arise.  Patient Instructions  Thyroid is normal range . Triglycerides  are up some could be related to decrease activity .  intensify activity    Walking etc to substitute.  consider a pedometer and look at phone apps.  Continue to have Dr.  Earlean Myers address the GI issue.     Food Choices to Lower Your Triglycerides  Triglycerides are a type of fat in your blood. High levels of triglycerides can increase the risk of heart disease and stroke. If your triglyceride levels are high, the foods you eat and your eating habits are very important. Choosing the right foods can help lower your triglycerides.  WHAT GENERAL GUIDELINES DO  I NEED TO FOLLOW?  Lose weight if you are overweight.   Limit or avoid alcohol.   Fill one half of your plate with vegetables and green salads.   Limit fruit to two servings a day. Choose fruit instead of juice.   Make one fourth of your plate whole grains. Look for the word "whole" as the first word in the ingredient list.  Fill one fourth of your plate with lean protein foods.  Enjoy fatty fish (such as salmon, mackerel, sardines, and tuna) three times a week.   Choose healthy fats.   Limit foods high in starch and sugar.  Eat more home-cooked food and less restaurant, buffet, and fast food.  Limit fried foods.  Cook foods using methods other than frying.  Limit saturated fats.  Check ingredient lists to avoid foods with partially hydrogenated oils (trans fats) in them. WHAT FOODS CAN I EAT?  Grains Whole grains, such as whole wheat or whole grain breads, crackers, cereals, and pasta. Unsweetened oatmeal, bulgur, barley, quinoa, or brown rice. Corn or whole wheat flour tortillas.  Vegetables Fresh or frozen vegetables (raw, steamed, roasted, or grilled). Green salads. Fruits All fresh, canned (in natural juice), or frozen fruits. Meat and Other Protein Products Ground beef (85% or leaner), grass-fed beef, or beef trimmed of fat. Skinless chicken or [place]. Ground chicken or [place]. Pork trimmed of fat. All fish and seafood. Eggs. Dried beans, peas, or lentils.  Unsalted nuts or seeds. Unsalted canned or dry beans. Dairy Low-fat dairy products, such as skim or 1% milk, 2% or reduced-fat cheeses, low-fat ricotta or cottage cheese, or plain low-fat yogurt. Fats and Oils Tub margarines without trans fats. Light or reduced-fat mayonnaise and salad dressings. Avocado. Safflower, olive, or canola oils. Natural peanut or almond butter. The items listed above may not be a complete list of recommended foods or beverages. Contact your dietitian for more options. WHAT FOODS ARE NOT RECOMMENDED?  Grains White bread. White pasta. White rice. Cornbread. Bagels, pastries, and croissants. Crackers that contain trans fat. Vegetables White potatoes. Corn. Creamed or fried vegetables. Vegetables in a cheese sauce. Fruits Dried fruits. Canned fruit in light or heavy syrup. Fruit juice. Meat and Other Protein Products Fatty cuts of meat. Ribs, chicken wings, bacon, sausage, bologna, salami, chitterlings, fatback, hot dogs, bratwurst, and packaged luncheon meats. Dairy Whole or 2% milk, cream, half-and-half, and cream cheese. Whole-fat or sweetened yogurt. Full-fat cheeses. Nondairy creamers and whipped toppings. Processed cheese, cheese spreads, or cheese curds. Sweets and Desserts Corn syrup, sugars, honey, and molasses. Candy. Jam and jelly. Syrup. Sweetened cereals. Cookies, pies, cakes, donuts, muffins, and ice cream. Fats and Oils Butter, stick margarine, lard, shortening, ghee, or bacon fat. Coconut, palm kernel, or palm oils. Beverages Alcohol. Sweetened drinks (such as sodas, lemonade, and fruit drinks or punches). The items listed above may not be a complete list of foods and beverages to avoid. Contact your dietitian for more information. Document Released: 09/25/2004 Document Revised: 12/13/2013 Document Reviewed: 10/12/2013 [facility] Patient Information 2015 [place], [place]. This information is not intended to replace advice given to you by your health care  provider. Make sure you discuss any questions you have with your health care provider.       Standley Brooking. Jaeanna Mccomber M.D.  Total visit 35mins > 50% spent counseling and coordinating care

## 2014-12-12 ENCOUNTER — Telehealth: Payer: Self-pay | Admitting: Internal Medicine

## 2014-12-12 ENCOUNTER — Telehealth: Payer: Self-pay

## 2014-12-12 NOTE — Telephone Encounter (Signed)
Pt has uti. Some spotty bleeding. Can get her in 8-10 min. Pls advise if ok to schedule. No appt here today.

## 2014-12-12 NOTE — Telephone Encounter (Signed)
pt advised to go to urgent care. No appts at any [facility]. Pt was not comfortable going soemwhere she did not know how to get to. Pt will try [facility] [address].

## 2014-12-12 NOTE — Telephone Encounter (Signed)
See other telephone note.  Pt will proceed to urgent care due to no appointments at [facility] locations.

## 2014-12-12 NOTE — Telephone Encounter (Signed)
Noted. Apologies to the pt.

## 2014-12-12 NOTE — Telephone Encounter (Signed)
[facility] - Client [facility] Patient Name: Christina Myers Gender: Female DOB: 1941-06-18 Age: 73 Y 9 M 6 D Return Phone Number: [phone] (Primary) Address: [address] City/State/Zip: [place] [address] Client [facility] - Client Client Site [facility] - Day Physician Shanon Ace Contact Type Call Call Type Triage / Clinical Relationship To Patient Self Return Phone Number [phone] (Primary) Chief Complaint Urine, Blood In Initial Comment Caller states she is bleeding, thinks from urinary tract, possibly vaginal. PreDisposition Call Doctor Nurse Assessment Nurse: Vallery Sa, RN, Tye [place] Date/Time Eilene Ghazi Time): 12/12/2014 10:03:29 AM Confirm and document reason for call. If symptomatic, describe symptoms. ---Caller states she developed blood in urinary tract last night. No fever. No injury in the past 3 days. Has the patient traveled out of the country within the last 30 days? ---No Does the patient require triage? ---Yes Related visit to physician within the last 2 weeks? ---No Does the PT have any chronic conditions? (i.e. diabetes, asthma, etc.) ---Yes List chronic conditions. ---Thyroid problems, High Cholesterol Guidelines Guideline Title Affirmed Question Affirmed Notes Nurse Date/Time (Eastern Time) Urine - Blood In Blood in urine, but all triage questions negative (Exception: could be normal menstrual bleeding) Vallery Sa, RN, Tye [place] 12/12/2014 10:05:19 AM Disp. Time Eilene Ghazi Time) Disposition Final User 12/12/2014 10:07:51 AM See Physician within 24 Hours Yes Trumbull, RN, Rosey Bath Understands: Yes Disagree/Comply: Comply PLEASE NOTE: All timestamps contained within this report are represented as [place] Standard Time. CONFIDENTIALTY NOTICE: This fax transmission is intended only for the addressee. It contains information that is  legally privileged, confidential or otherwise protected from use or disclosure. If you are not the intended recipient, you are strictly prohibited from reviewing, disclosing, copying using or disseminating any of this information or taking any action in reliance on or regarding this information. If you have received this fax in error, please notify us immediately by telephone so that we can arrange for its return to [place]. Phone: [phone], Toll-Free: [phone], Fax: [phone] Page: 2 of 2 Call Id: [id number] Care Advice Given Per Guideline SEE PHYSICIAN WITHIN 24 HOURS: * IF OFFICE WILL BE OPEN: You need to be examined within the next 24 hours. Call your doctor when the office opens, and make an appointment. CALL BACK IF: * Fever or pain occurs * You become worse. CARE ADVICE given per Urine, Blood In (Adult) guideline. After Care Instructions Given Call Event Type User Date / Time Description Comments User: Berton Mount, RN Date/Time Eilene Ghazi Time): 12/12/2014 10:11:52 AM Transferred to [facility] via the Appointment Line to check appointment options. Encouraged [name] to call back as needed. Referrals REFERRED TO PCP OFFICE

## 2015-01-12 ENCOUNTER — Other Ambulatory Visit: Payer: Self-pay | Admitting: Internal Medicine

## 2015-02-04 ENCOUNTER — Other Ambulatory Visit: Payer: Self-pay | Admitting: Internal Medicine

## 2015-02-05 NOTE — Telephone Encounter (Signed)
Sent to the pharmacy by e-scribe. 

## 2015-02-06 NOTE — Telephone Encounter (Signed)
Synthroid was approved by [employer], for a year. Pt, pharm and doc informed. Letter will be sent out.

## 2015-03-02 ENCOUNTER — Other Ambulatory Visit: Payer: Self-pay

## 2015-03-02 DIAGNOSIS — Z1231 Encounter for screening mammogram for malignant neoplasm of breast: Secondary | ICD-10-CM

## 2015-03-20 ENCOUNTER — Ambulatory Visit
Admission: RE | Admit: 2015-03-20 | Discharge: 2015-03-20 | Disposition: A | Discharge status: Home / Self Care | Payer: Medicare Other | Source: Ambulatory Visit

## 2015-03-20 DIAGNOSIS — Z1231 Encounter for screening mammogram for malignant neoplasm of breast: Secondary | ICD-10-CM

## 2015-04-10 ENCOUNTER — Other Ambulatory Visit: Payer: Self-pay | Admitting: Internal Medicine

## 2015-04-10 NOTE — Telephone Encounter (Signed)
Sent to the pharmacy by e-scribe. 

## 2015-06-03 ENCOUNTER — Other Ambulatory Visit: Payer: Self-pay | Admitting: Internal Medicine

## 2015-06-04 NOTE — Telephone Encounter (Signed)
Refilled on 02/05/15 for 6 months.  Request is too early.  Will fill for 90 more days to keep on file.  Pt to return in Nov 2016.

## 2015-06-06 ENCOUNTER — Other Ambulatory Visit: Payer: Self-pay | Admitting: Internal Medicine

## 2015-06-06 NOTE — Telephone Encounter (Signed)
Filled on 06/04/15

## 2015-08-28 ENCOUNTER — Other Ambulatory Visit: Payer: Self-pay | Admitting: Internal Medicine

## 2015-08-30 ENCOUNTER — Telehealth: Payer: Self-pay | Admitting: Family Medicine

## 2015-08-30 NOTE — Telephone Encounter (Signed)
Sent to the pharmacy by e-scribe.  Message sent to scheduling to help her make lab appt and yearly 30 minute follow up.

## 2015-08-30 NOTE — Telephone Encounter (Signed)
In November

## 2015-08-30 NOTE — Telephone Encounter (Signed)
Pt needs 30 minute yearly follow up (not medicare wellness) and lab work.  Please help her to make appointment.  Send message back to me.  Will need to check with [facility] on lab orders.  Thanks!

## 2015-08-31 NOTE — Telephone Encounter (Signed)
Pt appts has been made. Please put lab order in

## 2015-09-03 ENCOUNTER — Other Ambulatory Visit: Payer: Self-pay | Admitting: Family Medicine

## 2015-09-03 DIAGNOSIS — Z0000 Encounter for general adult medical examination without abnormal findings: Secondary | ICD-10-CM

## 2015-09-03 NOTE — Telephone Encounter (Signed)
Lab orders placed in the system.

## 2015-10-25 ENCOUNTER — Other Ambulatory Visit: Payer: Self-pay | Admitting: Internal Medicine

## 2015-10-26 NOTE — Telephone Encounter (Signed)
Sent to the pharmacy by e-scribe.  Pt has upcoming visit on 11/09/15

## 2015-11-09 ENCOUNTER — Other Ambulatory Visit (INDEPENDENT_AMBULATORY_CARE_PROVIDER_SITE_OTHER): Payer: Medicare Other

## 2015-11-09 DIAGNOSIS — Z0000 Encounter for general adult medical examination without abnormal findings: Secondary | ICD-10-CM

## 2015-11-09 DIAGNOSIS — E785 Hyperlipidemia, unspecified: Secondary | ICD-10-CM

## 2015-11-09 LAB — BASIC METABOLIC PANEL
BUN: 15 mg/dL (ref 6–23)
CO2: 30 mEq/L (ref 19–32)
Calcium: 9.4 mg/dL (ref 8.4–10.5)
Chloride: 105 mEq/L (ref 96–112)
Creatinine, Ser: 0.71 mg/dL (ref 0.40–1.20)
GFR: 84.97 mL/min (ref >60.00)
GLUCOSE: 97 mg/dL (ref 70–99)
POTASSIUM: 5.3 meq/L — AB (ref 3.5–5.1)
SODIUM: 142 meq/L (ref 135–145)

## 2015-11-09 LAB — LIPID PANEL
CHOL/HDL RATIO: 4
Cholesterol: 173 mg/dL (ref 0–200)
HDL: 48.3 mg/dL (ref >39.00)
LDL CALC: 86 mg/dL (ref 0–99)
NONHDL: 124.38
TRIGLYCERIDES: 194 mg/dL — AB (ref 0.0–149.0)
VLDL: 38.8 mg/dL (ref 0.0–40.0)

## 2015-11-09 LAB — CBC WITH DIFFERENTIAL/PLATELET
Basophils Absolute: 0 10*3/uL (ref 0.0–0.1)
Basophils Relative: 0.5 % (ref 0.0–3.0)
EOS PCT: 4.5 % (ref 0.0–5.0)
Eosinophils Absolute: 0.3 10*3/uL (ref 0.0–0.7)
HEMATOCRIT: 43.1 % (ref 36.0–46.0)
Hemoglobin: 14.7 g/dL (ref 12.0–15.0)
LYMPHS ABS: 1.6 10*3/uL (ref 0.7–4.0)
Lymphocytes Relative: 24.9 % (ref 12.0–46.0)
MCHC: 34.1 g/dL (ref 30.0–36.0)
MCV: 91.9 fl (ref 78.0–100.0)
MONOS PCT: 10.7 % (ref 3.0–12.0)
Monocytes Absolute: 0.7 10*3/uL (ref 0.1–1.0)
Neutro Abs: 3.8 10*3/uL (ref 1.4–7.7)
Neutrophils Relative %: 59.4 % (ref 43.0–77.0)
PLATELETS: 330 10*3/uL (ref 150.0–400.0)
RBC: 4.69 Mil/uL (ref 3.87–5.11)
RDW: 13 % (ref 11.5–15.5)
WBC: 6.4 10*3/uL (ref 4.0–10.5)

## 2015-11-09 LAB — TSH: TSH: 1.96 u[IU]/mL (ref 0.35–4.50)

## 2015-11-09 LAB — HEPATIC FUNCTION PANEL
ALBUMIN: 4.2 g/dL (ref 3.5–5.2)
ALK PHOS: 92 U/L (ref 39–117)
ALT: 13 U/L (ref 0–35)
AST: 13 U/L (ref 0–37)
Bilirubin, Direct: 0.1 mg/dL (ref 0.0–0.3)
TOTAL PROTEIN: 6.5 g/dL (ref 6.0–8.3)
Total Bilirubin: 0.5 mg/dL (ref 0.2–1.2)

## 2015-11-21 ENCOUNTER — Ambulatory Visit (INDEPENDENT_AMBULATORY_CARE_PROVIDER_SITE_OTHER): Payer: Medicare Other | Admitting: Internal Medicine

## 2015-11-21 ENCOUNTER — Encounter: Payer: Self-pay | Admitting: Internal Medicine

## 2015-11-21 VITALS — BP 124/68 | Temp 98.0°F | Ht 61.5 in

## 2015-11-21 DIAGNOSIS — Z23 Encounter for immunization: Secondary | ICD-10-CM

## 2015-11-21 DIAGNOSIS — K589 Irritable bowel syndrome without diarrhea: Secondary | ICD-10-CM

## 2015-11-21 DIAGNOSIS — Z0000 Encounter for general adult medical examination without abnormal findings: Secondary | ICD-10-CM | POA: Diagnosis not present

## 2015-11-21 DIAGNOSIS — E039 Hypothyroidism, unspecified: Secondary | ICD-10-CM

## 2015-11-21 DIAGNOSIS — E782 Mixed hyperlipidemia: Secondary | ICD-10-CM

## 2015-11-21 DIAGNOSIS — Z79899 Other long term (current) drug therapy: Secondary | ICD-10-CM

## 2015-11-21 MED ORDER — SYNTHROID 100 MCG PO TABS: 100.0000 ug | ORAL_TABLET | Freq: Every day | ORAL | Status: DC

## 2015-11-21 MED ORDER — SIMVASTATIN 20 MG PO TABS: 20.0000 mg | ORAL_TABLET | Freq: Every day | ORAL | Status: DC

## 2015-11-21 NOTE — Progress Notes (Signed)
Pre visit review using our clinic review tool, if applicable. No additional management support is needed unless otherwise documented below in the visit note.  Chief Complaint  Patient presents with  . Yearly Visit    Does not get physicals wellness  visit   . Hyperlipidemia  . Hypothyroidism    HPI: Christina Myers 74 y.o. comes in today for Preventive Medicare wellness visit and disease medication managemtne  .Since last visit. Doing well ibs  rx helped  Same thyroid med no se noted . Lipid med no se of med  Now off oral hrt   Health Maintenance  Topic Date Due  . INFLUENZA VACCINE  07/22/2016  . TETANUS/TDAP  12/22/2018  . DEXA SCAN  Completed  . ZOSTAVAX  Completed  . PNA vac Low Risk Adult  Completed   Health Maintenance Review LIFESTYLE:  TAD:  No tob social rare  Sugar beverages: not reg  Diet green tea .  Sleep: about 8.5   Takes half unisom at night .  For a year.  Ok  colon July tiny polyps  To come back years 5  ??? Mom had colon cancer .  MEDICARE DOCUMENT QUESTIONS  TO SCAN    Hearing: ok  Vision:  No limitations at present . Last eye check UTD  Safety:  Has smoke detector and wears seat belts.  No firearms. No excess sun exposure. Sees dentist regularly.  Falls: no  Advance directive :  Reviewed  Has one.  Memory: Felt to be good  , no concern from her or her family.  Depression: No anhedonia unusual crying or depressive symptoms  Nutrition: Eats well balanced diet; adequate calcium and vitamin D. No swallowing chewing problems.  Injury: no major injuries in the last six months.  Other healthcare providers:  Reviewed today .  Social:  Lives with spouse married. No pets.   Preventive parameters: up-to-date  Reviewed   ADLS:   There are no problems or need for assistance  driving, feeding, obtaining food, dressing, toileting and bathing, managing money using phone. She is independent. Still  Teaches dance  Some    ROS:  GEN/ HEENT: No fever,  significant weight changes sweats headaches vision problems hearing changes, CV/ PULM; No chest pain shortness of breath cough, syncope,edema  change in exercise tolerance. GI /GU: No adominal pain, vomiting, change in bowel habits. No blood in the stool. No significant GU symptoms. SKIN/HEME: ,no acute skin rashes suspicious lesions or bleeding. No lymphadenopathy, nodules, masses.  NEURO/ PSYCH:  No neurologic signs such as weakness numbness. No depression anxiety. IMM/ Allergy: No unusual infections.  Allergy .   REST of 12 system review negative except as per HPI   Past Medical History  Diagnosis Date  . Hx of colonic polyps   . Hypertension   . Hypothyroidism   . Seasonal allergies   . Skin cancer     hx of bcca of face  . PVC (premature ventricular contraction)     [facility] evaluation 7/03  . SKIN CANCER, HX OF 02/02/2008    Qualifier: Diagnosis of  By: Regis Bill MD, [place], HAND 04/24/2009    Qualifier: Diagnosis of  By: Regis Bill MD, [place], HX OF 08/30/2007    Qualifier: Diagnosis of  By: Rogue Bussing CMA, Jacqualynn      Family History  Problem Relation Age of Onset  . Cancer Other     colon, prostate   .  Diabetes Other   . Stroke Other   . Heart disease Other   . Colon cancer Mother     Social History   Social History  . Marital Status: Married    Spouse Name: N/A  . Number of Children: N/A  . Years of Education: N/A   Social History Main Topics  . Smoking status: Former Research scientist (life sciences)  . Smokeless tobacco: None  . Alcohol Use: Yes     Comment: rarely  . Drug Use: No  . Sexual Activity: Not Asked   Other Topics Concern  . None   Social History Narrative   Married    Former smoker   Set designer and teaches tap dancing.    No etoh   HH of 2     Outpatient Encounter Prescriptions as of 11/21/2015  Medication Sig  . aspirin 81 MG tablet Take 81 mg by mouth daily.    [name] BIOTIN PO Take by mouth.  . cholecalciferol (VITAMIN D)  1000 UNITS tablet Take 1,000 Units by mouth daily.    . Coenzyme Q10 (CO Q-10) 100 MG CAPS Take by mouth.  . Eluxadoline (VIBERZI) 100 MG TABS Take 1 tablet by mouth daily.  [name] estradiol (ESTRACE) 0.1 MG/GM vaginal cream Place 2 g vaginally daily.    . Probiotic Product ([place]) Take by mouth.  . simvastatin (ZOCOR) 20 MG tablet Take 1 tablet (20 mg total) by mouth at bedtime.  [name] SYNTHROID 100 MCG tablet Take 1 tablet (100 mcg total) by mouth daily.  . [DISCONTINUED] simvastatin (ZOCOR) 20 MG tablet TAKE 1 TABLET BY MOUTH EVERY DAY AT BEDTIME  . [DISCONTINUED] SYNTHROID 100 MCG tablet TAKE 1 TABLET BY MOUTH EVERY DAY  . [DISCONTINUED] estradiol (CLIMARA - DOSED IN MG/24 HR) 0.025 mg/24hr patch Place 0.025 mg onto the skin once a week.  . [DISCONTINUED] Magnesium 250 MG TABS Take by mouth.  . [DISCONTINUED] nabumetone (RELAFEN) 500 MG tablet Take 500 mg by mouth 2 (two) times daily. With food  . [DISCONTINUED] progesterone (PROMETRIUM) 200 MG capsule 12 days semi-annually   No facility-administered encounter medications on file as of 11/21/2015.    EXAM:  BP 124/68 mmHg  Temp(Src) 98 F (36.7 C) (Oral)  Ht 5' 1.5" (1.562 m)  Wt   There is no weight on file to calculate BMI.  Physical Exam: Vital signs reviewed ONG:EXBM is a well-developed well-nourished alert cooperative   who appears stated age in no acute distress.  HEENT: normocephalic atraumatic , Eyes: PERRL EOM's full, conjunctiva clear, Nares: paten,t no deformity discharge or tenderness., Ears: no deformity EAC's clear TMs with normal landmarks. Mouth: clear OP, no lesions, edema.  Moist mucous membranes. Dentition in adequate repair. NECK: supple without masses bruits. No thyroid nodules  Noted  CHEST/PULM:  Clear to auscultation and percussion breath sounds equal no wheeze , rales or rhonchi. No chest wall deformities or tenderness. CV: PMI is nondisplaced, S1 S2 no gallops, murmurs, rubs. Peripheral pulses  are full without delay.No JVD .  ABDOMEN: Bowel sounds normal nontender  No guard or rebound, no hepato splenomegal no CVA tenderness.   Extremtities:  No clubbing cyanosis or edema, no acute joint swelling or redness no focal atrophy NEURO:  Oriented x3, cranial nerves 3-12 appear to be intact, no obvious focal weakness,gait within normal limits no abnormal reflexes or asymmetrical SKIN: No acute rashes normal turgor, color, no bruising or petechiae. PSYCH: Oriented, good eye contact, no obvious depression anxiety, cognition and judgment appear  normal. LN: no cervical  adenopathy No noted deficits in memory, attention, and speech.   Lab Results  Component Value Date   WBC 6.4 11/09/2015   HGB 14.7 11/09/2015   HCT 43.1 11/09/2015   PLT 330.0 11/09/2015   GLUCOSE 97 11/09/2015   CHOL 173 11/09/2015   TRIG 194.0* 11/09/2015   HDL 48.30 11/09/2015   LDLDIRECT 92.3 10/18/2014   LDLCALC 86 11/09/2015   ALT 13 11/09/2015   AST 13 11/09/2015   NA 142 11/09/2015   K 5.3* 11/09/2015   CL 105 11/09/2015   CREATININE 0.71 11/09/2015   BUN 15 11/09/2015   CO2 30 11/09/2015   TSH 1.96 11/09/2015   INR 1.0 ratio 11/27/2010    ASSESSMENT AND PLAN:  Discussed the following assessment and plan:  Medicare annual wellness visit, subsequent  HYPERLIPIDEMIA  Hypothyroidism, unspecified hypothyroidism type  Medication management  Need for prophylactic vaccination and inoculation against influenza - Plan: Flu vaccine HIGH DOSE PF (Fluzone High dose)  IBS (irritable bowel syndrome) Potassium 5.3 not felt to be clinically significant  Patient Care Team: Burnis Medin, MD as PCP - [place], NP as Nurse Practitioner (Gynecology) Danella Sensing, MD (Dermatology) Richmond Campbell, MD (Gastroenterology) Elsie Saas, MD (Orthopedic Surgery)  Patient Instructions     Health Maintenance, Female Adopting a healthy lifestyle and getting preventive care can go a long way to  promote health and wellness. Talk with your health care provider about what schedule of regular examinations is right for you. This is a good chance for you to check in with your provider about disease prevention and staying healthy. In between checkups, there are plenty of things you can do on your own. Experts have done a lot of research about which lifestyle changes and preventive measures are most likely to keep you healthy. Ask your health care provider for more information. WEIGHT AND DIET  Eat a healthy diet  Be sure to include plenty of vegetables, fruits, low-fat dairy products, and lean protein.  Do not eat a lot of foods high in solid fats, added sugars, or salt.  Get regular exercise. This is one of the most important things you can do for your health.  Most adults should exercise for at least 150 minutes each week. The exercise should increase your heart rate and make you sweat (moderate-intensity exercise).  Most adults should also do strengthening exercises at least twice a week. This is in addition to the moderate-intensity exercise.  Maintain a healthy weight  Body mass index (BMI) is a measurement that can be used to identify possible weight problems. It estimates body fat based on height and weight. Your health care provider can help determine your BMI and help you achieve or maintain a healthy weight.  For females 24 years of age and older:   A BMI below 18.5 is considered underweight.  A BMI of 18.5 to 24.9 is normal.  A BMI of 25 to 29.9 is considered overweight.  A BMI of 30 and above is considered obese.  Watch levels of cholesterol and blood lipids  You should start having your blood tested for lipids and cholesterol at 76 years of age, then have this test every 5 years.  You may need to have your cholesterol levels checked more often if:  Your lipid or cholesterol levels are high.  You are older than 76 years of age.  You are at high risk for heart  disease.  CANCER SCREENING  Lung Cancer  Lung cancer screening is recommended for adults 50-5 years old who are at high risk for lung cancer because of a history of smoking.  A yearly low-dose CT scan of the lungs is recommended for people who:  Currently smoke.  Have quit within the past 15 years.  Have at least a 30-pack-year history of smoking. A pack year is smoking an average of one pack of cigarettes a day for 1 year.  Yearly screening should continue until it has been 15 years since you quit.  Yearly screening should stop if you develop a health problem that would prevent you from having lung cancer treatment.  Breast Cancer  Practice breast self-awareness. This means understanding how your breasts normally appear and feel.  It also means doing regular breast self-exams. Let your health care provider know about any changes, no matter how small.  If you are in your 20s or 30s, you should have a clinical breast exam (CBE) by a health care provider every 1-3 years as part of a regular health exam.  If you are 72 or older, have a CBE every year. Also consider having a breast X-ray (mammogram) every year.  If you have a family history of breast cancer, talk to your health care provider about genetic screening.  If you are at high risk for breast cancer, talk to your health care provider about having an MRI and a mammogram every year.  Breast cancer gene (BRCA) assessment is recommended for women who have family members with BRCA-related cancers. BRCA-related cancers include:  Breast.  Ovarian.  Tubal.  Peritoneal cancers.  Results of the assessment will determine the need for genetic counseling and BRCA1 and BRCA2 testing. Cervical Cancer Your health care provider may recommend that you be screened regularly for cancer of the pelvic organs (ovaries, uterus, and vagina). This screening involves a pelvic examination, including checking for microscopic changes to the  surface of your cervix (Pap test). You may be encouraged to have this screening done every 3 years, beginning at age 73.  For women ages 74-65, health care providers may recommend pelvic exams and Pap testing every 3 years, or they may recommend the Pap and pelvic exam, combined with testing for human papilloma virus (HPV), every 5 years. Some types of HPV increase your risk of cervical cancer. Testing for HPV may also be done on women of any age with unclear Pap test results.  Other health care providers may not recommend any screening for nonpregnant women who are considered low risk for pelvic cancer and who do not have symptoms. Ask your health care provider if a screening pelvic exam is right for you.  If you have had past treatment for cervical cancer or a condition that could lead to cancer, you need Pap tests and screening for cancer for at least 20 years after your treatment. If Pap tests have been discontinued, your risk factors (such as having a new sexual partner) need to be reassessed to determine if screening should resume. Some women have medical problems that increase the chance of getting cervical cancer. In these cases, your health care provider may recommend more frequent screening and Pap tests. Colorectal Cancer  This type of cancer can be detected and often prevented.  Routine colorectal cancer screening usually begins at 76 years of age and continues through 76 years of age.  Your health care provider may recommend screening at an earlier age if you have risk factors for colon cancer.  Your  health care provider may also recommend using home test kits to check for hidden blood in the stool.  A small camera at the end of a tube can be used to examine your colon directly (sigmoidoscopy or colonoscopy). This is done to check for the earliest forms of colorectal cancer.  Routine screening usually begins at age 60.  Direct examination of the colon should be repeated every 5-10  years through 76 years of age. However, you may need to be screened more often if early forms of precancerous polyps or small growths are found. Skin Cancer  Check your skin from head to toe regularly.  Tell your health care provider about any new moles or changes in moles, especially if there is a change in a mole's shape or color.  Also tell your health care provider if you have a mole that is larger than the size of a pencil eraser.  Always use sunscreen. Apply sunscreen liberally and repeatedly throughout the day.  Protect yourself by wearing long sleeves, pants, a wide-brimmed hat, and sunglasses whenever you are outside. HEART DISEASE, DIABETES, AND HIGH BLOOD PRESSURE   High blood pressure causes heart disease and increases the risk of stroke. High blood pressure is more likely to develop in:  People who have blood pressure in the high end of the normal range (130-139/85-89 mm Hg).  People who are overweight or obese.  People who are African American.  If you are 34-19 years of age, have your blood pressure checked every 3-5 years. If you are 3 years of age or older, have your blood pressure checked every year. You should have your blood pressure measured twice--once when you are at a hospital or clinic, and once when you are not at a hospital or clinic. Record the average of the two measurements. To check your blood pressure when you are not at a hospital or clinic, you can use:  An automated blood pressure machine at a pharmacy.  A home blood pressure monitor.  If you are between 52 years and 71 years old, ask your health care provider if you should take aspirin to prevent strokes.  Have regular diabetes screenings. This involves taking a blood sample to check your fasting blood sugar level.  If you are at a normal weight and have a low risk for diabetes, have this test once every three years after 76 years of age.  If you are overweight and have a high risk for diabetes,  consider being tested at a younger age or more often. PREVENTING INFECTION  Hepatitis B  If you have a higher risk for hepatitis B, you should be screened for this virus. You are considered at high risk for hepatitis B if:  You were born in a country where hepatitis B is common. Ask your health care provider which countries are considered high risk.  Your parents were born in a high-risk country, and you have not been immunized against hepatitis B (hepatitis B vaccine).  You have HIV or AIDS.  You use needles to inject street drugs.  You live with someone who has hepatitis B.  You have had sex with someone who has hepatitis B.  You get hemodialysis treatment.  You take certain medicines for conditions, including cancer, organ transplantation, and autoimmune conditions. Hepatitis C  Blood testing is recommended for:  Everyone born from [birth year] through 1967.  Anyone with known risk factors for hepatitis C. Sexually transmitted infections (STIs)  You should be screened for sexually transmitted  infections (STIs) including gonorrhea and chlamydia if:  You are sexually active and are younger than 76 years of age.  You are older than 76 years of age and your health care provider tells you that you are at risk for this type of infection.  Your sexual activity has changed since you were last screened and you are at an increased risk for chlamydia or gonorrhea. Ask your health care provider if you are at risk.  If you do not have HIV, but are at risk, it may be recommended that you take a prescription medicine daily to prevent HIV infection. This is called pre-exposure prophylaxis (PrEP). You are considered at risk if:  You are sexually active and do not regularly use condoms or know the HIV status of your partner(s).  You take drugs by injection.  You are sexually active with a partner who has HIV. Talk with your health care provider about whether you are at high risk of being  infected with HIV. If you choose to begin PrEP, you should first be tested for HIV. You should then be tested every 3 months for as long as you are taking PrEP.  PREGNANCY   If you are premenopausal and you may become pregnant, ask your health care provider about preconception counseling.  If you may become pregnant, take 400 to 800 micrograms (mcg) of folic acid every day.  If you want to prevent pregnancy, talk to your health care provider about birth control (contraception). OSTEOPOROSIS AND MENOPAUSE   Osteoporosis is a disease in which the bones lose minerals and strength with aging. This can result in serious bone fractures. Your risk for osteoporosis can be identified using a bone density scan.  If you are 27 years of age or older, or if you are at risk for osteoporosis and fractures, ask your health care provider if you should be screened.  Ask your health care provider whether you should take a calcium or vitamin D supplement to lower your risk for osteoporosis.  Menopause may have certain physical symptoms and risks.  Hormone replacement therapy may reduce some of these symptoms and risks. Talk to your health care provider about whether hormone replacement therapy is right for you.  HOME CARE INSTRUCTIONS   Schedule regular health, dental, and eye exams.  Stay current with your immunizations.   Do not use any tobacco products including cigarettes, chewing tobacco, or electronic cigarettes.  If you are pregnant, do not drink alcohol.  If you are breastfeeding, limit how much and how often you drink alcohol.  Limit alcohol intake to no more than 1 drink per day for nonpregnant women. One drink equals 12 ounces of beer, 5 ounces of wine, or 1 ounces of hard liquor.  Do not use street drugs.  Do not share needles.  Ask your health care provider for help if you need support or information about quitting drugs.  Tell your health care provider if you often feel  depressed.  Tell your health care provider if you have ever been abused or do not feel safe at home.   This information is not intended to replace advice given to you by your health care provider. Make sure you discuss any questions you have with your health care provider.   Document Released: 06/23/2011 Document Revised: 12/29/2014 Document Reviewed: 11/09/2013 Elsevier Interactive Patient Education 2016 [place]. Emony Dormer M.D.

## 2015-11-21 NOTE — Patient Instructions (Signed)
Health Maintenance, Female Adopting a healthy lifestyle and getting preventive care can go a long way to promote health and wellness. Talk with your health care provider about what schedule of regular examinations is right for you. This is a good chance for you to check in with your provider about disease prevention and staying healthy. In between checkups, there are plenty of things you can do on your own. Experts have done a lot of research about which lifestyle changes and preventive measures are most likely to keep you healthy. Ask your health care provider for more information. WEIGHT AND DIET  Eat a healthy diet  Be sure to include plenty of vegetables, fruits, low-fat dairy products, and lean protein.  Do not eat a lot of foods high in solid fats, added sugars, or salt.  Get regular exercise. This is one of the most important things you can do for your health.  Most adults should exercise for at least 150 minutes each week. The exercise should increase your heart rate and make you sweat (moderate-intensity exercise).  Most adults should also do strengthening exercises at least twice a week. This is in addition to the moderate-intensity exercise.  Maintain a healthy weight  Body mass index (BMI) is a measurement that can be used to identify possible weight problems. It estimates body fat based on height and weight. Your health care provider can help determine your BMI and help you achieve or maintain a healthy weight.  For females 20 years of age and older:   A BMI below 18.5 is considered underweight.  A BMI of 18.5 to 24.9 is normal.  A BMI of 25 to 29.9 is considered overweight.  A BMI of 30 and above is considered obese.  Watch levels of cholesterol and blood lipids  You should start having your blood tested for lipids and cholesterol at 76 years of age, then have this test every 5 years.  You may need to have your cholesterol levels checked more often if:  Your lipid  or cholesterol levels are high.  You are older than 76 years of age.  You are at high risk for heart disease.  CANCER SCREENING   Lung Cancer  Lung cancer screening is recommended for adults 55-80 years old who are at high risk for lung cancer because of a history of smoking.  A yearly low-dose CT scan of the lungs is recommended for people who:  Currently smoke.  Have quit within the past 15 years.  Have at least a 30-pack-year history of smoking. A pack year is smoking an average of one pack of cigarettes a day for 1 year.  Yearly screening should continue until it has been 15 years since you quit.  Yearly screening should stop if you develop a health problem that would prevent you from having lung cancer treatment.  Breast Cancer  Practice breast self-awareness. This means understanding how your breasts normally appear and feel.  It also means doing regular breast self-exams. Let your health care provider know about any changes, no matter how small.  If you are in your 20s or 30s, you should have a clinical breast exam (CBE) by a health care provider every 1-3 years as part of a regular health exam.  If you are 40 or older, have a CBE every year. Also consider having a breast X-ray (mammogram) every year.  If you have a family history of breast cancer, talk to your health care provider about genetic screening.  If you   are at high risk for breast cancer, talk to your health care provider about having an MRI and a mammogram every year.  Breast cancer gene (BRCA) assessment is recommended for women who have family members with BRCA-related cancers. BRCA-related cancers include:  Breast.  Ovarian.  Tubal.  Peritoneal cancers.  Results of the assessment will determine the need for genetic counseling and BRCA1 and BRCA2 testing. Cervical Cancer Your health care provider may recommend that you be screened regularly for cancer of the pelvic organs (ovaries, uterus, and  vagina). This screening involves a pelvic examination, including checking for microscopic changes to the surface of your cervix (Pap test). You may be encouraged to have this screening done every 3 years, beginning at age 21.  For women ages 30-65, health care providers may recommend pelvic exams and Pap testing every 3 years, or they may recommend the Pap and pelvic exam, combined with testing for human papilloma virus (HPV), every 5 years. Some types of HPV increase your risk of cervical cancer. Testing for HPV may also be done on women of any age with unclear Pap test results.  Other health care providers may not recommend any screening for nonpregnant women who are considered low risk for pelvic cancer and who do not have symptoms. Ask your health care provider if a screening pelvic exam is right for you.  If you have had past treatment for cervical cancer or a condition that could lead to cancer, you need Pap tests and screening for cancer for at least 20 years after your treatment. If Pap tests have been discontinued, your risk factors (such as having a new sexual partner) need to be reassessed to determine if screening should resume. Some women have medical problems that increase the chance of getting cervical cancer. In these cases, your health care provider may recommend more frequent screening and Pap tests. Colorectal Cancer  This type of cancer can be detected and often prevented.  Routine colorectal cancer screening usually begins at 76 years of age and continues through 75 years of age.  Your health care provider may recommend screening at an earlier age if you have risk factors for colon cancer.  Your health care provider may also recommend using home test kits to check for hidden blood in the stool.  A small camera at the end of a tube can be used to examine your colon directly (sigmoidoscopy or colonoscopy). This is done to check for the earliest forms of colorectal  cancer.  Routine screening usually begins at age 50.  Direct examination of the colon should be repeated every 5-10 years through 75 years of age. However, you may need to be screened more often if early forms of precancerous polyps or small growths are found. Skin Cancer  Check your skin from head to toe regularly.  Tell your health care provider about any new moles or changes in moles, especially if there is a change in a mole's shape or color.  Also tell your health care provider if you have a mole that is larger than the size of a pencil eraser.  Always use sunscreen. Apply sunscreen liberally and repeatedly throughout the day.  Protect yourself by wearing long sleeves, pants, a wide-brimmed hat, and sunglasses whenever you are outside. HEART DISEASE, DIABETES, AND HIGH BLOOD PRESSURE   High blood pressure causes heart disease and increases the risk of stroke. High blood pressure is more likely to develop in:  People who have blood pressure in the high end   of the normal range (130-139/85-89 mm Hg).  People who are overweight or obese.  People who are African American.  If you are 18-39 years of age, have your blood pressure checked every 3-5 years. If you are 40 years of age or older, have your blood pressure checked every year. You should have your blood pressure measured twice--once when you are at a hospital or clinic, and once when you are not at a hospital or clinic. Record the average of the two measurements. To check your blood pressure when you are not at a hospital or clinic, you can use:  An automated blood pressure machine at a pharmacy.  A home blood pressure monitor.  If you are between 55 years and 79 years old, ask your health care provider if you should take aspirin to prevent strokes.  Have regular diabetes screenings. This involves taking a blood sample to check your fasting blood sugar level.  If you are at a normal weight and have a low risk for diabetes,  have this test once every three years after 76 years of age.  If you are overweight and have a high risk for diabetes, consider being tested at a younger age or more often. PREVENTING INFECTION  Hepatitis B  If you have a higher risk for hepatitis B, you should be screened for this virus. You are considered at high risk for hepatitis B if:  You were born in a country where hepatitis B is common. Ask your health care provider which countries are considered high risk.  Your parents were born in a high-risk country, and you have not been immunized against hepatitis B (hepatitis B vaccine).  You have HIV or AIDS.  You use needles to inject street drugs.  You live with someone who has hepatitis B.  You have had sex with someone who has hepatitis B.  You get hemodialysis treatment.  You take certain medicines for conditions, including cancer, organ transplantation, and autoimmune conditions. Hepatitis C  Blood testing is recommended for:  Everyone born from 1945 through 1965.  Anyone with known risk factors for hepatitis C. Sexually transmitted infections (STIs)  You should be screened for sexually transmitted infections (STIs) including gonorrhea and chlamydia if:  You are sexually active and are younger than 76 years of age.  You are older than 76 years of age and your health care provider tells you that you are at risk for this type of infection.  Your sexual activity has changed since you were last screened and you are at an increased risk for chlamydia or gonorrhea. Ask your health care provider if you are at risk.  If you do not have HIV, but are at risk, it may be recommended that you take a prescription medicine daily to prevent HIV infection. This is called pre-exposure prophylaxis (PrEP). You are considered at risk if:  You are sexually active and do not regularly use condoms or know the HIV status of your partner(s).  You take drugs by injection.  You are sexually  active with a partner who has HIV. Talk with your health care provider about whether you are at high risk of being infected with HIV. If you choose to begin PrEP, you should first be tested for HIV. You should then be tested every 3 months for as long as you are taking PrEP.  PREGNANCY   If you are premenopausal and you may become pregnant, ask your health care provider about preconception counseling.  If you may   become pregnant, take 400 to 800 micrograms (mcg) of folic acid every day.  If you want to prevent pregnancy, talk to your health care provider about birth control (contraception). OSTEOPOROSIS AND MENOPAUSE   Osteoporosis is a disease in which the bones lose minerals and strength with aging. This can result in serious bone fractures. Your risk for osteoporosis can be identified using a bone density scan.  If you are 65 years of age or older, or if you are at risk for osteoporosis and fractures, ask your health care provider if you should be screened.  Ask your health care provider whether you should take a calcium or vitamin D supplement to lower your risk for osteoporosis.  Menopause may have certain physical symptoms and risks.  Hormone replacement therapy may reduce some of these symptoms and risks. Talk to your health care provider about whether hormone replacement therapy is right for you.  HOME CARE INSTRUCTIONS   Schedule regular health, dental, and eye exams.  Stay current with your immunizations.   Do not use any tobacco products including cigarettes, chewing tobacco, or electronic cigarettes.  If you are pregnant, do not drink alcohol.  If you are breastfeeding, limit how much and how often you drink alcohol.  Limit alcohol intake to no more than 1 drink per day for nonpregnant women. One drink equals 12 ounces of beer, 5 ounces of wine, or 1 ounces of hard liquor.  Do not use street drugs.  Do not share needles.  Ask your health care provider for help if  you need support or information about quitting drugs.  Tell your health care provider if you often feel depressed.  Tell your health care provider if you have ever been abused or do not feel safe at home.   This information is not intended to replace advice given to you by your health care provider. Make sure you discuss any questions you have with your health care provider.   Document Released: 06/23/2011 Document Revised: 12/29/2014 Document Reviewed: 11/09/2013 [employer] Interactive Patient Education 2016 [employer]  

## 2015-11-24 ENCOUNTER — Encounter: Payer: Self-pay | Admitting: Internal Medicine

## 2015-11-24 DIAGNOSIS — K589 Irritable bowel syndrome without diarrhea: Secondary | ICD-10-CM | POA: Insufficient documentation

## 2016-02-22 ENCOUNTER — Other Ambulatory Visit: Payer: Self-pay

## 2016-02-22 DIAGNOSIS — Z1231 Encounter for screening mammogram for malignant neoplasm of breast: Secondary | ICD-10-CM

## 2016-03-04 DIAGNOSIS — H18413 Arcus senilis, bilateral: Secondary | ICD-10-CM | POA: Diagnosis not present

## 2016-03-04 DIAGNOSIS — H353230 Exudative age-related macular degeneration, bilateral, stage unspecified: Secondary | ICD-10-CM | POA: Diagnosis not present

## 2016-03-04 DIAGNOSIS — H2511 Age-related nuclear cataract, right eye: Secondary | ICD-10-CM | POA: Diagnosis not present

## 2016-03-04 DIAGNOSIS — Z961 Presence of intraocular lens: Secondary | ICD-10-CM | POA: Diagnosis not present

## 2016-03-27 ENCOUNTER — Ambulatory Visit
Admission: RE | Admit: 2016-03-27 | Discharge: 2016-03-27 | Disposition: A | Discharge status: Home / Self Care | Payer: Medicare Other | Source: Ambulatory Visit

## 2016-03-27 DIAGNOSIS — Z1231 Encounter for screening mammogram for malignant neoplasm of breast: Secondary | ICD-10-CM

## 2016-03-28 DIAGNOSIS — H2511 Age-related nuclear cataract, right eye: Secondary | ICD-10-CM | POA: Diagnosis not present

## 2016-03-28 DIAGNOSIS — H25811 Combined forms of age-related cataract, right eye: Secondary | ICD-10-CM | POA: Diagnosis not present

## 2016-04-02 DIAGNOSIS — H353112 Nonexudative age-related macular degeneration, right eye, intermediate dry stage: Secondary | ICD-10-CM | POA: Diagnosis not present

## 2016-04-02 DIAGNOSIS — H353221 Exudative age-related macular degeneration, left eye, with active choroidal neovascularization: Secondary | ICD-10-CM | POA: Diagnosis not present

## 2016-04-02 DIAGNOSIS — H43812 Vitreous degeneration, left eye: Secondary | ICD-10-CM | POA: Diagnosis not present

## 2016-04-09 DIAGNOSIS — H353221 Exudative age-related macular degeneration, left eye, with active choroidal neovascularization: Secondary | ICD-10-CM | POA: Diagnosis not present

## 2016-05-07 DIAGNOSIS — H353112 Nonexudative age-related macular degeneration, right eye, intermediate dry stage: Secondary | ICD-10-CM | POA: Diagnosis not present

## 2016-05-07 DIAGNOSIS — H35423 Microcystoid degeneration of retina, bilateral: Secondary | ICD-10-CM | POA: Diagnosis not present

## 2016-05-07 DIAGNOSIS — H43812 Vitreous degeneration, left eye: Secondary | ICD-10-CM | POA: Diagnosis not present

## 2016-05-07 DIAGNOSIS — H353221 Exudative age-related macular degeneration, left eye, with active choroidal neovascularization: Secondary | ICD-10-CM | POA: Diagnosis not present

## 2016-06-09 ENCOUNTER — Encounter: Payer: Self-pay | Admitting: Internal Medicine

## 2016-06-09 DIAGNOSIS — L565 Disseminated superficial actinic porokeratosis (DSAP): Secondary | ICD-10-CM | POA: Diagnosis not present

## 2016-06-09 DIAGNOSIS — Z85828 Personal history of other malignant neoplasm of skin: Secondary | ICD-10-CM | POA: Diagnosis not present

## 2016-06-09 DIAGNOSIS — D2239 Melanocytic nevi of other parts of face: Secondary | ICD-10-CM | POA: Diagnosis not present

## 2016-06-09 DIAGNOSIS — D692 Other nonthrombocytopenic purpura: Secondary | ICD-10-CM | POA: Diagnosis not present

## 2016-06-09 DIAGNOSIS — D485 Neoplasm of uncertain behavior of skin: Secondary | ICD-10-CM | POA: Diagnosis not present

## 2016-06-09 DIAGNOSIS — D1801 Hemangioma of skin and subcutaneous tissue: Secondary | ICD-10-CM | POA: Diagnosis not present

## 2016-06-09 DIAGNOSIS — C44719 Basal cell carcinoma of skin of left lower limb, including hip: Secondary | ICD-10-CM | POA: Diagnosis not present

## 2016-06-09 DIAGNOSIS — L821 Other seborrheic keratosis: Secondary | ICD-10-CM | POA: Diagnosis not present

## 2016-06-10 DIAGNOSIS — H353221 Exudative age-related macular degeneration, left eye, with active choroidal neovascularization: Secondary | ICD-10-CM | POA: Diagnosis not present

## 2016-06-10 DIAGNOSIS — H43812 Vitreous degeneration, left eye: Secondary | ICD-10-CM | POA: Diagnosis not present

## 2016-06-10 DIAGNOSIS — H35423 Microcystoid degeneration of retina, bilateral: Secondary | ICD-10-CM | POA: Diagnosis not present

## 2016-06-10 DIAGNOSIS — H353112 Nonexudative age-related macular degeneration, right eye, intermediate dry stage: Secondary | ICD-10-CM | POA: Diagnosis not present

## 2016-06-23 LAB — HM COLONOSCOPY

## 2016-08-05 ENCOUNTER — Encounter: Payer: Self-pay | Admitting: Internal Medicine

## 2016-08-05 DIAGNOSIS — H43812 Vitreous degeneration, left eye: Secondary | ICD-10-CM | POA: Diagnosis not present

## 2016-08-05 DIAGNOSIS — H353112 Nonexudative age-related macular degeneration, right eye, intermediate dry stage: Secondary | ICD-10-CM | POA: Diagnosis not present

## 2016-08-05 DIAGNOSIS — H353221 Exudative age-related macular degeneration, left eye, with active choroidal neovascularization: Secondary | ICD-10-CM | POA: Diagnosis not present

## 2016-08-05 DIAGNOSIS — H35423 Microcystoid degeneration of retina, bilateral: Secondary | ICD-10-CM | POA: Diagnosis not present

## 2016-08-16 ENCOUNTER — Other Ambulatory Visit: Payer: Self-pay | Admitting: Internal Medicine

## 2016-08-19 NOTE — Telephone Encounter (Signed)
Sent to the pharmacy by e-scribe for 6 months.  Pt has upcoming wellness on 11/21/16.

## 2016-09-16 ENCOUNTER — Encounter: Payer: Self-pay | Admitting: Internal Medicine

## 2016-09-16 DIAGNOSIS — H35423 Microcystoid degeneration of retina, bilateral: Secondary | ICD-10-CM | POA: Diagnosis not present

## 2016-09-16 DIAGNOSIS — H43812 Vitreous degeneration, left eye: Secondary | ICD-10-CM | POA: Diagnosis not present

## 2016-09-16 DIAGNOSIS — H353112 Nonexudative age-related macular degeneration, right eye, intermediate dry stage: Secondary | ICD-10-CM | POA: Diagnosis not present

## 2016-09-16 DIAGNOSIS — H353221 Exudative age-related macular degeneration, left eye, with active choroidal neovascularization: Secondary | ICD-10-CM | POA: Diagnosis not present

## 2016-09-30 ENCOUNTER — Ambulatory Visit (INDEPENDENT_AMBULATORY_CARE_PROVIDER_SITE_OTHER): Payer: Medicare Other | Admitting: Family Medicine

## 2016-09-30 DIAGNOSIS — Z23 Encounter for immunization: Secondary | ICD-10-CM

## 2016-10-13 ENCOUNTER — Other Ambulatory Visit: Payer: Self-pay | Admitting: Internal Medicine

## 2016-10-13 DIAGNOSIS — M722 Plantar fascial fibromatosis: Secondary | ICD-10-CM | POA: Diagnosis not present

## 2016-10-14 NOTE — Telephone Encounter (Signed)
Pt need new Rx for simvastatin   Pharm:  [facility] [address]

## 2016-10-15 NOTE — Telephone Encounter (Signed)
Sent to the pharmacy by e-scribe.  Pt is scheduled for cpx on 11/26/16.

## 2016-10-22 ENCOUNTER — Encounter: Payer: Self-pay | Admitting: Internal Medicine

## 2016-10-22 DIAGNOSIS — H43812 Vitreous degeneration, left eye: Secondary | ICD-10-CM | POA: Diagnosis not present

## 2016-10-22 DIAGNOSIS — H353112 Nonexudative age-related macular degeneration, right eye, intermediate dry stage: Secondary | ICD-10-CM | POA: Diagnosis not present

## 2016-10-22 DIAGNOSIS — H35423 Microcystoid degeneration of retina, bilateral: Secondary | ICD-10-CM | POA: Diagnosis not present

## 2016-10-22 DIAGNOSIS — H353221 Exudative age-related macular degeneration, left eye, with active choroidal neovascularization: Secondary | ICD-10-CM | POA: Diagnosis not present

## 2016-11-17 ENCOUNTER — Other Ambulatory Visit: Payer: Medicare Other

## 2016-11-19 ENCOUNTER — Other Ambulatory Visit (INDEPENDENT_AMBULATORY_CARE_PROVIDER_SITE_OTHER): Payer: Medicare Other

## 2016-11-19 DIAGNOSIS — Z0000 Encounter for general adult medical examination without abnormal findings: Secondary | ICD-10-CM | POA: Diagnosis not present

## 2016-11-19 LAB — HEPATIC FUNCTION PANEL
ALK PHOS: 87 U/L (ref 39–117)
ALT: 16 U/L (ref 0–35)
AST: 15 U/L (ref 0–37)
Albumin: 4.2 g/dL (ref 3.5–5.2)
BILIRUBIN DIRECT: 0.1 mg/dL (ref 0.0–0.3)
BILIRUBIN TOTAL: 0.4 mg/dL (ref 0.2–1.2)
Total Protein: 6.3 g/dL (ref 6.0–8.3)

## 2016-11-19 LAB — LIPID PANEL
CHOL/HDL RATIO: 3
Cholesterol: 174 mg/dL (ref 0–200)
HDL: 52.7 mg/dL (ref >39.00)
LDL CALC: 83 mg/dL (ref 0–99)
NONHDL: 121.48
Triglycerides: 192 mg/dL — ABNORMAL HIGH (ref 0.0–149.0)
VLDL: 38.4 mg/dL (ref 0.0–40.0)

## 2016-11-19 LAB — BASIC METABOLIC PANEL
BUN: 18 mg/dL (ref 6–23)
CALCIUM: 8.6 mg/dL (ref 8.4–10.5)
CO2: 28 meq/L (ref 19–32)
CREATININE: 0.67 mg/dL (ref 0.40–1.20)
Chloride: 106 mEq/L (ref 96–112)
GFR: 90.6 mL/min (ref >60.00)
Glucose, Bld: 89 mg/dL (ref 70–99)
Potassium: 4.2 mEq/L (ref 3.5–5.1)
Sodium: 142 mEq/L (ref 135–145)

## 2016-11-19 LAB — CBC WITH DIFFERENTIAL/PLATELET
BASOS ABS: 0 10*3/uL (ref 0.0–0.1)
Basophils Relative: 0.4 % (ref 0.0–3.0)
EOS ABS: 0.1 10*3/uL (ref 0.0–0.7)
Eosinophils Relative: 2.5 % (ref 0.0–5.0)
HEMATOCRIT: 42.5 % (ref 36.0–46.0)
Hemoglobin: 14.5 g/dL (ref 12.0–15.0)
LYMPHS ABS: 2 10*3/uL (ref 0.7–4.0)
LYMPHS PCT: 35.9 % (ref 12.0–46.0)
MCHC: 34.1 g/dL (ref 30.0–36.0)
MCV: 93.3 fl (ref 78.0–100.0)
MONO ABS: 0.5 10*3/uL (ref 0.1–1.0)
Monocytes Relative: 9.4 % (ref 3.0–12.0)
NEUTROS ABS: 2.9 10*3/uL (ref 1.4–7.7)
NEUTROS PCT: 51.8 % (ref 43.0–77.0)
PLATELETS: 282 10*3/uL (ref 150.0–400.0)
RBC: 4.55 Mil/uL (ref 3.87–5.11)
RDW: 14 % (ref 11.5–15.5)
WBC: 5.7 10*3/uL (ref 4.0–10.5)

## 2016-11-19 LAB — TSH: TSH: 1.54 u[IU]/mL (ref 0.35–4.50)

## 2016-11-21 ENCOUNTER — Encounter: Payer: Medicare Other | Admitting: Internal Medicine

## 2016-11-25 NOTE — Progress Notes (Signed)
Pre visit review using our clinic review tool, if applicable. No additional management support is needed unless otherwise documented below in the visit note.  Chief Complaint  Patient presents with  . Medicare Wellness    check up.  . Hypothyroidism  . Hyperlipidemia    HPI:  Christina Myers 75 y.o. comes in today for Preventive Medicare wellness visit . cpx  And cdm Since last visit. Doing well under rx for amd under therapy  On simvastatin   ? Leg cramps.   Once after a lot of exercise  Mustard.   Otherwise tolerating   thyroid no change med   Had [facility]sca shave dx tshin per dr Ronnald Ramp  Health Maintenance  Topic Date Due  . Christina Myers  12/22/2018  . INFLUENZA VACCINE  Completed  . DEXA SCAN  Completed  . ZOSTAVAX  Completed  . PNA vac Low Risk Adult  Completed   Health Maintenance Review LIFESTYLE:  TAD   seldom etoh exercises  Sugar beverages:n Sleep:  Ave 8 hours  HH of  2 cat.       MEDICARE DOCUMENT QUESTIONS  TO SCAN   Hearing: ok  Vision:  No limitations at present . Last eye check UTD  Eye disease  Myers   Safety:  Has smoke detector and wears seat belts.  No firearms. No excess sun exposure. Sees dentist regularly.  Falls: n  Advance directive :  Reviewed  Has one.  Memory: Felt to be good  , no concern from her or her family.  Depression: No anhedonia unusual crying or depressive symptoms  Nutrition: Eats well balanced diet; adequate calcium and vitamin D. No swallowing chewing problems.  Injury: no major injuries in the last six months.  Other healthcare providers:  Reviewed today .  Social:  Lives with spouse married. No pets.   Preventive parameters: up-to-date  Reviewed   ADLS:   There are no problems or need for assistance  driving, feeding, obtaining food, dressing, toileting and bathing, managing money using phone. She is independent. Dances    ROS:  Had plantar fasciitis GEN/ HEENT: No fever, significant weight changes sweats headaches  vision problems hearing changes, CV/ PULM; No chest pain shortness of breath cough, syncope,edema  change in exercise tolerance. GI /GU: No adominal pain, vomiting, change in bowel habits. No blood in the stool. No significant GU symptoms. SKIN/HEME: ,no acute skin rashes suspicious lesions or bleeding. No lymphadenopathy, nodules, masses.  NEURO/ PSYCH:  No neurologic signs such as weakness numbness. No depression anxiety. IMM/ Allergy: No unusual infections.  Allergy .   REST of 12 system review negative except as per HPI   Past Medical History:  Diagnosis Date  . COLONIC POLYPS, HX OF 08/30/2007   Qualifier: Diagnosis of  By: Christina Myers CMA, Christina Myers    . Hx of colonic polyps   . Hypertension   . Hypothyroidism   . OSTEOARTHRITIS, HAND 04/24/2009   Qualifier: Diagnosis of  By: Christina Myers, Christina Myers   . PVC (premature ventricular contraction)    [facility] evaluation 7/03  . Seasonal allergies   . Skin cancer    hx of bcca of face  . SKIN CANCER, HX OF 02/02/2008   Qualifier: Diagnosis of  By: Christina Myers, Christina Myers     Family History  Problem Relation Age of Onset  . Cancer Other     colon, prostate   . Diabetes Other   . Stroke Other   . Heart disease Other   .  Colon cancer Mother     Social History   Social History  . Marital status: Married    Spouse name: N/A  . Number of children: N/A  . Years of education: N/A   Social History Main Topics  . Smoking status: Former Research scientist (life sciences)  . Smokeless tobacco: Never Used  . Alcohol use Yes     Comment: rarely  . Drug use: No  . Sexual activity: Not Asked   Other Topics Concern  . None   Social History Narrative   Married    Former smoker   Set designer and teaches tap dancing.    No etoh   HH of 2     Outpatient Encounter Prescriptions as of 11/26/2016  Medication Sig  . aspirin 81 MG tablet Take 81 mg by mouth daily.    [name] BIOTIN PO Take by mouth.  . cholecalciferol (VITAMIN D) 1000 UNITS tablet Take 1,000 Units by mouth  daily.    . Coenzyme Q10 (CO Q-10) 100 MG CAPS Take by mouth.  . Eluxadoline (VIBERZI) 100 MG TABS Take 1 tablet by mouth daily.  [name] estradiol (ESTRACE) 0.1 MG/GM vaginal cream Place 2 g vaginally daily.    . meloxicam (MOBIC) 15 MG tablet TK 1 T PO QD WF  . simvastatin (ZOCOR) 20 MG tablet TAKE 1 TABLET BY MOUTH AT BEDTIME  . SYNTHROID 100 MCG tablet TAKE 1 TABLET BY MOUTH EVERY DAY  . [DISCONTINUED] Probiotic Product (Brule) Take by mouth.   No facility-administered encounter medications on file as of 11/26/2016.     EXAM:  BP 116/62 (BP Location: Right Arm, Patient Position: Sitting, Cuff Size: Normal)   Temp 97.7 F (36.5 C) (Oral)   Ht 5\' 2"  (1.575 m)   There is no height or weight on file to calculate BMI.  Physical Exam: Vital signs reviewed declines weight  [id number] is a well-developed well-nourished alert cooperative   who appears stated age in no acute distress.  HEENT: normocephalic atraumatic , Eyes: PERRL EOM's full, conjunctiva clear, Nares: paten,t no deformity discharge or tenderness., Ears: no deformity EAC's clear TMs with normal landmarks. Mouth: clear OP, no lesions, edema.  Moist mucous membranes. Dentition in adequate repair. NECK: supple without masses, thyromegaly or bruits. CHEST/PULM:  Clear to auscultation and percussion breath sounds equal no wheeze , rales or rhonchi. No chest wall deformities or tenderness. CV: PMI is nondisplaced, S1 S2 no gallops, murmurs, rubs. Peripheral pulses are full without delay.No JVD . Breast: normal by inspection . No dimpling, discharge, masses, tenderness or discharge . ABDOMEN: Bowel sounds normal nontender  No guard or rebound, no hepato splenomegal no CVA tenderness.   Extremtities:  No clubbing cyanosis or edema, no acute joint swelling or redness no focal atrophy NEURO:  Oriented x3, cranial nerves 3-12 appear to be intact, no obvious focal weakness,gait within normal limits no abnormal reflexes or  asymmetrical SKIN: No acute rashes normal turgor, color, no bruising or petechiae. PSYCH: Oriented, good eye contact, no obvious depression anxiety, cognition and judgment appear normal. LN: no cervical axillary inguinal adenopathy No noted deficits in memory, attention, and speech.   Lab Results  Component Value Date   WBC 5.7 11/19/2016   HGB 14.5 11/19/2016   HCT 42.5 11/19/2016   PLT 282.0 11/19/2016   GLUCOSE 89 11/19/2016   CHOL 174 11/19/2016   TRIG 192.0 (H) 11/19/2016   HDL 52.70 11/19/2016   LDLDIRECT 92.3 10/18/2014   LDLCALC 83 11/19/2016  ALT 16 11/19/2016   AST 15 11/19/2016   NA 142 11/19/2016   K 4.2 11/19/2016   CL 106 11/19/2016   CREATININE 0.67 11/19/2016   BUN 18 11/19/2016   CO2 28 11/19/2016   TSH 1.54 11/19/2016   INR 1.0 ratio 11/27/2010   Results reviewed w patient  ASSESSMENT AND PLAN:  Discussed the following assessment and plan:  Medicare annual wellness visit, subsequent  Visit for preventive health examination  Medication management  HYPERLIPIDEMIA - at goal  on meds  Hypothyroidism, unspecified type  Estrogen deficiency - Plan: DG Bone Density  Osteopenia, unspecified location - fam hx and thyroid risk  check vit d and dexa - Plan: DG Bone Density  AMD (age related macular degeneration) Last bone density was 2011 mild osteopenia in the hip. She does have risk with family history of osteoporosis and on thyroid medicine. Check vitamin D level may adding supplements. We'll let her know results when available. Maintain on Synthroid and simvastatin at this time. Patient Care Team: Burnis Medin, Myers as PCP - General Danella Sensing, Myers (Dermatology) Richmond Campbell, Myers (Gastroenterology) Elsie Saas, Myers (Orthopedic Surgery) Sherlynn Stalls, Myers as Consulting Physician (Ophthalmology) Heather [place], [place] (Optometry)  Patient Instructions  Continue lifestyle intervention healthy eating and exercise .  Get appt  For  Bone density    I  will pu t in order .  Advise taking  Vit d 1000 iiu per day .    Healthy lifestyle includes : At least 150 minutes of exercise weeks  , weight at healthy levels, which is usually   BMI 19-25. Avoid trans fats and processed foods;  Increase fresh fruits and veges to 5 servings per day. And avoid sweet beverages including tea and juice. Mediterranean diet with olive oil and nuts have been noted to be heart and brain healthy . Avoid tobacco products . Limit  alcohol to  7 per week for women and 14 servings for men.  Get adequate sleep . Wear seat belts . Don't text and drive .   Yearly flu vaccine    Can make  wellness visit with Wynetta Fines our health coach and  physical and med labs appointments  with me. In a year    Bone Health Introduction Bones protect organs, store calcium, and anchor muscles. Good health habits, such as eating nutritious foods and exercising regularly, are important for maintaining healthy bones. They can also help to prevent a condition that causes bones to lose density and become weak and brittle (osteoporosis). Why is bone mass important? Bone mass refers to the amount of bone tissue that you have. The higher your bone mass, the stronger your bones. An important step toward having healthy bones throughout life is to have strong and dense bones during childhood. A young adult who has a high bone mass is more likely to have a high bone mass later in life. Bone mass at its greatest it is called peak bone mass. A large decline in bone mass occurs in older adults. In women, it occurs about the time of menopause. During this time, it is important to practice good health habits, because if more bone is lost than what is replaced, the bones will become less healthy and more likely to break (fracture). If you find that you have a low bone mass, you may be able to prevent osteoporosis or further bone loss by changing your diet and lifestyle. How can I find out if my bone  mass  is low? Bone mass can be measured with an X-ray test that is called a bone mineral density (BMD) test. This test is recommended for all women who are age 34 or older. It may also be recommended for men who are age 63 or older, or for people who are more likely to develop osteoporosis due to:  Having bones that break easily.  Having a long-term disease that weakens bones, such as kidney disease or rheumatoid arthritis.  Having menopause earlier than normal.  Taking medicine that weakens bones, such as steroids, thyroid hormones, or hormone treatment for breast cancer or prostate cancer.  Smoking.  Drinking three or more alcoholic drinks each day. What are the nutritional recommendations for healthy bones? To have healthy bones, you need to get enough of the right minerals and vitamins. Most nutrition experts recommend getting these nutrients from the foods that you eat. Nutritional recommendations vary from person to person. Ask your health care provider what is healthy for you. Here are some general guidelines. Calcium Recommendations  Calcium is the most important (essential) mineral for bone health. Most people can get enough calcium from their diet, but supplements may be recommended for people who are at risk for osteoporosis. Good sources of calcium include:  Dairy products, such as low-fat or nonfat milk, cheese, and yogurt.  Dark green leafy vegetables, such as bok choy and broccoli.  Calcium-fortified foods, such as orange juice, cereal, bread, soy beverages, and tofu products.  Nuts, such as almonds. Follow these recommended amounts for daily calcium intake:  Children, age 38?3: 700 mg.  Children, age 75?8: 1,000 mg.  Children, age 750?13: 1,300 mg.  Teens, age 756?18: 1,300 mg.  Adults, age 31?50: 1,000 mg.  Adults, age 389?70:  Men: 1,000 mg.  Women: 1,200 mg.  Adults, age 758 or older: 1,200 mg.  Pregnant and breastfeeding females:  Teens: 1,300  mg.  Adults: 1,000 mg. Vitamin D Recommendations  Vitamin D is the most essential vitamin for bone health. It helps the body to absorb calcium. Sunlight stimulates the skin to make vitamin D, so be sure to get enough sunlight. If you live in a cold climate or you do not get outside often, your health care provider may recommend that you take vitamin D supplements. Good sources of vitamin D in your diet include:  Egg yolks.  Saltwater fish.  Milk and cereal fortified with vitamin D. Follow these recommended amounts for daily vitamin D intake:  Children and teens, age 751?18: 74 international units.  Adults, age 25 or younger: 400-800 international units.  Adults, age 384 or older: 800-1,000 international units. Other Nutrients  Other nutrients for bone health include:  Phosphorus. This mineral is found in meat, poultry, dairy foods, nuts, and legumes. The recommended daily intake for adult men and adult women is 700 mg.  Magnesium. This mineral is found in seeds, nuts, dark green vegetables, and legumes. The recommended daily intake for adult men is 400?420 mg. For adult women, it is 310?320 mg.  Vitamin K. This vitamin is found in green leafy vegetables. The recommended daily intake is 120 mg for adult men and 90 mg for adult women. What type of physical activity is best for building and maintaining healthy bones? Weight-bearing and strength-building activities are important for building and maintaining peak bone mass. Weight-bearing activities cause muscles and bones to work against gravity. Strength-building activities increases muscle strength that supports bones. Weight-bearing and muscle-building activities include:  Walking and hiking.  Jogging and running.  Dancing.  Gym exercises.  Lifting weights.  Tennis and racquetball.  Climbing stairs.  Aerobics. Adults should get at least 30 minutes of moderate physical activity on most days. Children should get at least 60  minutes of moderate physical activity on most days. Ask your health care provide what type of exercise is best for you. Where can I find more information? For more information, check out the following websites:  [place]: YardHomes.se  [employer] of Health: http://www.niams.AnonymousEar.fr.asp This information is not intended to replace advice given to you by your health care provider. Make sure you discuss any questions you have with your health care provider. Document Released: 02/28/2004 Document Revised: 06/27/2016 Document Reviewed: 12/13/2014  2017 Elsevier     Mariann Laster K. Rashika Bettes M.D.

## 2016-11-26 ENCOUNTER — Encounter: Payer: Self-pay | Admitting: Internal Medicine

## 2016-11-26 ENCOUNTER — Ambulatory Visit (INDEPENDENT_AMBULATORY_CARE_PROVIDER_SITE_OTHER): Payer: Medicare Other | Admitting: Internal Medicine

## 2016-11-26 VITALS — BP 116/62 | Temp 97.7°F | Ht 62.0 in

## 2016-11-26 DIAGNOSIS — H3530 Unspecified macular degeneration: Secondary | ICD-10-CM | POA: Insufficient documentation

## 2016-11-26 DIAGNOSIS — Z79899 Other long term (current) drug therapy: Secondary | ICD-10-CM | POA: Diagnosis not present

## 2016-11-26 DIAGNOSIS — Z0000 Encounter for general adult medical examination without abnormal findings: Secondary | ICD-10-CM

## 2016-11-26 DIAGNOSIS — E039 Hypothyroidism, unspecified: Secondary | ICD-10-CM

## 2016-11-26 DIAGNOSIS — M8580 Other specified disorders of bone density and structure, unspecified site: Secondary | ICD-10-CM | POA: Diagnosis not present

## 2016-11-26 DIAGNOSIS — E2839 Other primary ovarian failure: Secondary | ICD-10-CM

## 2016-11-26 DIAGNOSIS — E782 Mixed hyperlipidemia: Secondary | ICD-10-CM | POA: Diagnosis not present

## 2016-11-26 NOTE — Patient Instructions (Addendum)
Continue lifestyle intervention healthy eating and exercise .  Get appt  For  Bone density    I will pu t in order .  Advise taking  Vit d 1000 iiu per day .    Healthy lifestyle includes : At least 150 minutes of exercise weeks  , weight at healthy levels, which is usually   BMI 19-25. Avoid trans fats and processed foods;  Increase fresh fruits and veges to 5 servings per day. And avoid sweet beverages including tea and juice. Mediterranean diet with olive oil and nuts have been noted to be heart and brain healthy . Avoid tobacco products . Limit  alcohol to  7 per week for women and 14 servings for men.  Get adequate sleep . Wear seat belts . Don't text and drive .   Yearly flu vaccine    Can make  wellness visit with Wynetta Fines our health coach and  physical and med labs appointments  with me. In a year    Bone Health Introduction Bones protect organs, store calcium, and anchor muscles. Good health habits, such as eating nutritious foods and exercising regularly, are important for maintaining healthy bones. They can also help to prevent a condition that causes bones to lose density and become weak and brittle (osteoporosis). Why is bone mass important? Bone mass refers to the amount of bone tissue that you have. The higher your bone mass, the stronger your bones. An important step toward having healthy bones throughout life is to have strong and dense bones during childhood. A young adult who has a high bone mass is more likely to have a high bone mass later in life. Bone mass at its greatest it is called peak bone mass. A large decline in bone mass occurs in older adults. In women, it occurs about the time of menopause. During this time, it is important to practice good health habits, because if more bone is lost than what is replaced, the bones will become less healthy and more likely to break (fracture). If you find that you have a low bone mass, you may be able to prevent  osteoporosis or further bone loss by changing your diet and lifestyle. How can I find out if my bone mass is low? Bone mass can be measured with an X-ray test that is called a bone mineral density (BMD) test. This test is recommended for all women who are age 26 or older. It may also be recommended for men who are age 8 or older, or for people who are more likely to develop osteoporosis due to:  Having bones that break easily.  Having a long-term disease that weakens bones, such as kidney disease or rheumatoid arthritis.  Having menopause earlier than normal.  Taking medicine that weakens bones, such as steroids, thyroid hormones, or hormone treatment for breast cancer or prostate cancer.  Smoking.  Drinking three or more alcoholic drinks each day. What are the nutritional recommendations for healthy bones? To have healthy bones, you need to get enough of the right minerals and vitamins. Most nutrition experts recommend getting these nutrients from the foods that you eat. Nutritional recommendations vary from person to person. Ask your health care provider what is healthy for you. Here are some general guidelines. Calcium Recommendations  Calcium is the most important (essential) mineral for bone health. Most people can get enough calcium from their diet, but supplements may be recommended for people who are at risk for osteoporosis. Good sources of calcium  include:  Dairy products, such as low-fat or nonfat milk, cheese, and yogurt.  Dark green leafy vegetables, such as bok choy and broccoli.  Calcium-fortified foods, such as orange juice, cereal, bread, soy beverages, and tofu products.  Nuts, such as almonds. Follow these recommended amounts for daily calcium intake:  Children, age 30?3: 700 mg.  Children, age 6?8: 1,000 mg.  Children, age 84?13: 1,300 mg.  Teens, age 32?18: 1,300 mg.  Adults, age 35?50: 1,000 mg.  Adults, age 301?70:  Men: 1,000 mg.  Women: 1,200  mg.  Adults, age 64 or older: 1,200 mg.  Pregnant and breastfeeding females:  Teens: 1,300 mg.  Adults: 1,000 mg. Vitamin D Recommendations  Vitamin D is the most essential vitamin for bone health. It helps the body to absorb calcium. Sunlight stimulates the skin to make vitamin D, so be sure to get enough sunlight. If you live in a cold climate or you do not get outside often, your health care provider may recommend that you take vitamin D supplements. Good sources of vitamin D in your diet include:  Egg yolks.  Saltwater fish.  Milk and cereal fortified with vitamin D. Follow these recommended amounts for daily vitamin D intake:  Children and teens, age 21?18: 61 international units.  Adults, age 52 or younger: 400-800 international units.  Adults, age 69 or older: 800-1,000 international units. Other Nutrients  Other nutrients for bone health include:  Phosphorus. This mineral is found in meat, poultry, dairy foods, nuts, and legumes. The recommended daily intake for adult men and adult women is 700 mg.  Magnesium. This mineral is found in seeds, nuts, dark green vegetables, and legumes. The recommended daily intake for adult men is 400?420 mg. For adult women, it is 310?320 mg.  Vitamin K. This vitamin is found in green leafy vegetables. The recommended daily intake is 120 mg for adult men and 90 mg for adult women. What type of physical activity is best for building and maintaining healthy bones? Weight-bearing and strength-building activities are important for building and maintaining peak bone mass. Weight-bearing activities cause muscles and bones to work against gravity. Strength-building activities increases muscle strength that supports bones. Weight-bearing and muscle-building activities include:  Walking and hiking.  Jogging and running.  Dancing.  Gym exercises.  Lifting weights.  Tennis and racquetball.  Climbing stairs.  Aerobics. Adults should get at  least 30 minutes of moderate physical activity on most days. Children should get at least 60 minutes of moderate physical activity on most days. Ask your health care provide what type of exercise is best for you. Where can I find more information? For more information, check out the following websites:  [place]: [employer]se  [employer] of Health: http://www.niams.AnonymousEar.fr.asp This information is not intended to replace advice given to you by your health care provider. Make sure you discuss any questions you have with your health care provider. Document Released: 02/28/2004 Document Revised: 06/27/2016 Document Reviewed: 12/13/2014  2017 Elsevier

## 2016-12-05 DIAGNOSIS — H353112 Nonexudative age-related macular degeneration, right eye, intermediate dry stage: Secondary | ICD-10-CM | POA: Diagnosis not present

## 2016-12-05 DIAGNOSIS — H35423 Microcystoid degeneration of retina, bilateral: Secondary | ICD-10-CM | POA: Diagnosis not present

## 2016-12-05 DIAGNOSIS — H43812 Vitreous degeneration, left eye: Secondary | ICD-10-CM | POA: Diagnosis not present

## 2016-12-05 DIAGNOSIS — H353221 Exudative age-related macular degeneration, left eye, with active choroidal neovascularization: Secondary | ICD-10-CM | POA: Diagnosis not present

## 2016-12-26 ENCOUNTER — Ambulatory Visit (INDEPENDENT_AMBULATORY_CARE_PROVIDER_SITE_OTHER)
Admission: RE | Admit: 2016-12-26 | Discharge: 2016-12-26 | Disposition: A | Discharge status: Home / Self Care | Payer: Medicare Other | Source: Ambulatory Visit | Attending: Internal Medicine | Admitting: Internal Medicine

## 2016-12-26 DIAGNOSIS — E2839 Other primary ovarian failure: Secondary | ICD-10-CM

## 2016-12-26 DIAGNOSIS — M8580 Other specified disorders of bone density and structure, unspecified site: Secondary | ICD-10-CM

## 2017-01-02 ENCOUNTER — Other Ambulatory Visit: Payer: Self-pay | Admitting: Family Medicine

## 2017-01-11 ENCOUNTER — Other Ambulatory Visit: Payer: Self-pay | Admitting: Internal Medicine

## 2017-01-14 NOTE — Telephone Encounter (Signed)
Sent to the pharmacy by e-scribe.  Pt due for yearly 12/18.

## 2017-01-20 DIAGNOSIS — H35423 Microcystoid degeneration of retina, bilateral: Secondary | ICD-10-CM | POA: Diagnosis not present

## 2017-01-20 DIAGNOSIS — H353112 Nonexudative age-related macular degeneration, right eye, intermediate dry stage: Secondary | ICD-10-CM | POA: Diagnosis not present

## 2017-01-20 DIAGNOSIS — H43812 Vitreous degeneration, left eye: Secondary | ICD-10-CM | POA: Diagnosis not present

## 2017-01-20 DIAGNOSIS — H353221 Exudative age-related macular degeneration, left eye, with active choroidal neovascularization: Secondary | ICD-10-CM | POA: Diagnosis not present

## 2017-02-06 ENCOUNTER — Telehealth: Payer: Self-pay | Admitting: Internal Medicine

## 2017-02-06 NOTE — Telephone Encounter (Signed)
[facility]    --------------------------------------------------------------------------------   Patient Name: Christina Myers  DOB: 10/16/1941    Initial Comment Caller states has been sick since Wednesday. Having vomiting and chills. Has taken Ibuprofen. Wanting to see what she can take for the nausea.        Nurse Assessment  Nurse: Richardson Landry, RN, Aldona Bar Date/Time (Eastern Time): 02/06/2017 3:52:32 PM  Confirm and document reason for call. If symptomatic, describe symptoms. ---Caller states she took ibuprofen at 1pm, woke up with nausea and vomited. Caller states every afternoon she becomes chilled and starts shaking. Normal temp is 97 and now it is 99-100 by mouth. Denies temp currently. Pt has had diarrhea, vomiting on Wednesday. Denies diarrhea since Wednesday. Decreased appetite.    Does the patient have any new or worsening symptoms? ---Yes    Will a triage be completed? ---Yes    Related visit to physician within the last 2 weeks? ---No    Does the PT have any chronic conditions? (i.e. diabetes, asthma, etc.) ---No    Is this a behavioral health or substance abuse call? ---No           Guidelines      Guideline Title Affirmed Question Affirmed Notes  Vomiting [1] MILD or MODERATE vomiting AND [2] present > 48 hours (2 days) (Exception: mild vomiting with associated diarrhea)      Final Disposition User    See Physician within 24 Hours [place], RN, Aldona Bar      Comments  No appointments available    Referrals  GO TO FACILITY UNDECIDED    Disagree/Comply: Comply

## 2017-02-07 DIAGNOSIS — A084 Viral intestinal infection, unspecified: Secondary | ICD-10-CM | POA: Diagnosis not present

## 2017-02-09 NOTE — Progress Notes (Signed)
Pre visit review using our clinic review tool, if applicable. No additional management support is needed unless otherwise documented below in the visit note.  Chief Complaint  Patient presents with  . Follow-up    Gastroenteritis.  [facility]    HPI: Christina Myers 76 y.o.  SDAComes in today because she is not getting better and still feeling tired from her illness. Onset 7 days ago of acute vomiting and frequent diarrhea 8 on one day. Since that time her diarrhea subsided with occasional stooling decreased but intermittent severe nausea and occasional vomiting. Her last vomiting was 2 days ago she called the office and team health advised she go to urgent care seen on the weekend with a diagnosis of viral gastroenteritis. She was given Zofran 4 nausea and vomiting. She has been able to take in a small amount of food and is much better in that respect than last week however still taking fluids and doesn't think she's urinating and off. 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 Last night she had chills and fever feeling did not take Tylenol today hasn't had it during the day today. Her initial fever feeling was chills and shakes. When she was in urgent care she had a temperature of 100.9. ROS: See pertinent positives and negatives per HPI. denies uti sx hematuria or abd pain .  Husband has not been ill. She did travel to [place] for week at the beginning of February returned home about Feb 4. Did not get sick at that time  Past Medical History:  Diagnosis Date  . COLONIC POLYPS, HX OF 08/30/2007   Qualifier: Diagnosis of  By: Rogue Bussing CMA, Maryann Alar    . Hx of colonic polyps   . Hypertension   . Hypothyroidism   . OSTEOARTHRITIS, HAND 04/24/2009   Qualifier: Diagnosis of  By: Regis Bill MD, Standley Brooking   . PVC (premature ventricular contraction)    [facility] evaluation 7/03  . Seasonal allergies   . Skin cancer    hx of bcca of  face  . SKIN CANCER, HX OF 02/02/2008   Qualifier: Diagnosis of  By: Regis Bill MD, Standley Brooking     Family History  Problem Relation Age of Onset  . Cancer Other     colon, prostate   . Diabetes Other   . Stroke Other   . Heart disease Other   . Colon cancer Mother     Social History   Social History  . Marital status: Married    Spouse name: N/A  . Number of children: N/A  . Years of education: N/A   Social History Main Topics  . Smoking status: Former Research scientist (life sciences)  . Smokeless tobacco: Never Used  . Alcohol use Yes     Comment: rarely  . Drug use: No  . Sexual activity: Not on file   Other Topics Concern  . Not on file   Social History Narrative   Married    Former smoker   Set designer and teaches tap dancing.    No etoh   HH of 2     Outpatient Medications Prior to Visit  Medication Sig Dispense Refill  . aspirin 81 MG tablet Take 81 mg by mouth daily.      [name] BIOTIN PO Take by mouth.    . Cholecalciferol (VITAMIN D3) 2000 units TABS Take 2,000 Units by mouth daily.    . Eluxadoline (VIBERZI) 100 MG TABS Take  1 tablet by mouth daily.    [name] estradiol (ESTRACE) 0.1 MG/GM vaginal cream Place 2 g vaginally daily.      . meloxicam (MOBIC) 15 MG tablet TK 1 T PO QD WF  3  . simvastatin (ZOCOR) 20 MG tablet TAKE 1 TABLET BY MOUTH AT BEDTIME 90 tablet 2  . SYNTHROID 100 MCG tablet TAKE 1 TABLET BY MOUTH EVERY DAY 90 tablet 1  . Coenzyme Q10 (CO Q-10) 100 MG CAPS Take by mouth.     No facility-administered medications prior to visit.      EXAM:  BP 136/74 (BP Location: Right Arm, Patient Position: Sitting, Cuff Size: Normal)   Temp 98.2 F (36.8 C) (Oral)   Ht 5\' 2"  (1.575 m)   There is no height or weight on file to calculate BMI.  GENERAL: vitals reviewed and listed above, alert, oriented, appears well hydrated and in no acute distressLooks tired nontoxic well groomed normal skin color and turgor. HEENT: atraumatic, conjunctiva  clear, no obvious abnormalities on  inspection of external nose and ears OP : no lesion edema or exudate  NECK: no obvious masses on inspection palpation  LUNGS: clear to auscultation bilaterally, no wheezes, rales or rhonchi, good air movement CV: HRRR, no clubbing cyanosis or  peripheral edema nl cap refill  Abdomen soft without organomegaly guarding or rebound no obvious flank pain. MS: moves all extremities without noticeable focal  abnormality negative significant edema. Skin: normal capillary refill ,turgor , color: No acute rashes ,petechiae or bruising   ASSESSMENT AND PLAN:  Discussed the following assessment and plan:  Fever, unspecified fever cause - prolonged with gisx  ua abnormal rx for uti pending labs etc clsoe follow up planned  - Plan: CBC with Differential/Platelet, Comprehensive metabolic panel, Sedimentation rate, POCT Urinalysis Dipstick (Automated)  Gastroenteritis, infectious, presumed - Plan: CBC with Differential/Platelet, Comprehensive metabolic panel, Sedimentation rate, POCT Urinalysis Dipstick (Automated)  Other fatigue - Plan: CBC with Differential/Platelet, Comprehensive metabolic panel, Sedimentation rate, POCT Urinalysis Dipstick (Automated)  Dysuria - Plan: Urine culture Prolonged fever fatigue after initial vomiting diarrhea illness with diarrhea resolved. Clinical history consistent with fever at night. Check lab urinalysis etc. After patient left urinalysis positive nitrates blood and white cells we'll begin treatment empirically for UTI with fever. Close follow-up advised. Have nurse triage give her a call tomorrow about how she is feeling. -Patient advised to return or notify health care team  if symptoms worsen ,persist or new concerns arise.  Patient Instructions  This is most likely an infectious gastroenteritis that should resolve on its own however I agree that because of continuing fever and fatigue should watch things more closely. We'll let you know when lab results are  back. Continue rest fluids Want  to know if the fever is not gone in the next 24 hours. As you know fever tends to go up at night. If your lab tests are normal and your fever goes away I would give this more time to improve. You should be a whole lot better next week If the diarrhea comes back we may Order a stool test.     Standley Brooking. Benancio Osmundson M.D.

## 2017-02-10 ENCOUNTER — Ambulatory Visit (INDEPENDENT_AMBULATORY_CARE_PROVIDER_SITE_OTHER): Payer: Medicare Other | Admitting: Internal Medicine

## 2017-02-10 ENCOUNTER — Encounter: Payer: Self-pay | Admitting: Internal Medicine

## 2017-02-10 VITALS — BP 136/74 | Temp 98.2°F | Ht 62.0 in

## 2017-02-10 DIAGNOSIS — R5383 Other fatigue: Secondary | ICD-10-CM

## 2017-02-10 DIAGNOSIS — A09 Infectious gastroenteritis and colitis, unspecified: Secondary | ICD-10-CM

## 2017-02-10 DIAGNOSIS — R509 Fever, unspecified: Secondary | ICD-10-CM

## 2017-02-10 DIAGNOSIS — R300 Dysuria: Secondary | ICD-10-CM | POA: Diagnosis not present

## 2017-02-10 DIAGNOSIS — K529 Noninfective gastroenteritis and colitis, unspecified: Secondary | ICD-10-CM

## 2017-02-10 LAB — POC URINALSYSI DIPSTICK (AUTOMATED)
Bilirubin, UA: NEGATIVE
GLUCOSE UA: NEGATIVE
Nitrite, UA: POSITIVE
SPEC GRAV UA: 1.01
UROBILINOGEN UA: 1
pH, UA: 6

## 2017-02-10 MED ORDER — CIPROFLOXACIN HCL 500 MG PO TABS: 500.0000 mg | ORAL_TABLET | Freq: Two times a day (BID) | ORAL | 0 refills | Status: DC

## 2017-02-10 NOTE — Telephone Encounter (Signed)
Pt seen in office today. Nothing further needed.

## 2017-02-10 NOTE — Patient Instructions (Signed)
This is most likely an infectious gastroenteritis that should resolve on its own however I agree that because of continuing fever and fatigue should watch things more closely. We'll let you know when lab results are back. Continue rest fluids Want  to know if the fever is not gone in the next 24 hours. As you know fever tends to go up at night. If your lab tests are normal and your fever goes away I would give this more time to improve. You should be a whole lot better next week If the diarrhea comes back we may Order a stool test.

## 2017-02-11 ENCOUNTER — Telehealth: Payer: Self-pay | Admitting: Family Medicine

## 2017-02-11 LAB — CBC WITH DIFFERENTIAL/PLATELET
BASOS ABS: 0.1 10*3/uL (ref 0.0–0.1)
Basophils Relative: 0.3 % (ref 0.0–3.0)
Eosinophils Absolute: 0 10*3/uL (ref 0.0–0.7)
Eosinophils Relative: 0.2 % (ref 0.0–5.0)
HCT: 38.6 % (ref 36.0–46.0)
Hemoglobin: 13.4 g/dL (ref 12.0–15.0)
LYMPHS ABS: 0.8 10*3/uL (ref 0.7–4.0)
Lymphocytes Relative: 4.5 % — ABNORMAL LOW (ref 12.0–46.0)
MCHC: 34.8 g/dL (ref 30.0–36.0)
MCV: 92.3 fl (ref 78.0–100.0)
MONOS PCT: 8.3 % (ref 3.0–12.0)
Monocytes Absolute: 1.4 10*3/uL — ABNORMAL HIGH (ref 0.1–1.0)
Neutro Abs: 14.9 10*3/uL — ABNORMAL HIGH (ref 1.4–7.7)
Platelets: 275 10*3/uL (ref 150.0–400.0)
RBC: 4.18 Mil/uL (ref 3.87–5.11)
RDW: 13.8 % (ref 11.5–15.5)
WBC: 17.2 10*3/uL — ABNORMAL HIGH (ref 4.0–10.5)

## 2017-02-11 LAB — COMPREHENSIVE METABOLIC PANEL
ALK PHOS: 235 U/L — AB (ref 39–117)
ALT: 43 U/L — ABNORMAL HIGH (ref 0–35)
AST: 31 U/L (ref 0–37)
Albumin: 2.8 g/dL — ABNORMAL LOW (ref 3.5–5.2)
BILIRUBIN TOTAL: 0.5 mg/dL (ref 0.2–1.2)
BUN: 25 mg/dL — ABNORMAL HIGH (ref 6–23)
CO2: 26 mEq/L (ref 19–32)
Calcium: 8.1 mg/dL — ABNORMAL LOW (ref 8.4–10.5)
Chloride: 97 mEq/L (ref 96–112)
Creatinine, Ser: 1.44 mg/dL — ABNORMAL HIGH (ref 0.40–1.20)
GFR: 37.45 mL/min — AB (ref >60.00)
GLUCOSE: 109 mg/dL — AB (ref 70–99)
Potassium: 3.4 mEq/L — ABNORMAL LOW (ref 3.5–5.1)
SODIUM: 132 meq/L — AB (ref 135–145)
TOTAL PROTEIN: 5.3 g/dL — AB (ref 6.0–8.3)

## 2017-02-11 LAB — SEDIMENTATION RATE: Sed Rate: 37 mm/hr — ABNORMAL HIGH (ref 0–30)

## 2017-02-11 NOTE — Telephone Encounter (Signed)
Left a message for a return call.

## 2017-02-11 NOTE — Telephone Encounter (Signed)
Christina Medin, MD  Hulda Humphrey, CMA; Melburn Hake Cox, CMA    Please call her tomorrow midday or afternoon and see how she is doing .   We'll review her blood work before you call her she can let her know these results.  If her symptoms are from a UTI that fever may not be gone for 36-48 hours but then she should dramatically improved.  Thanks  [facility]

## 2017-02-11 NOTE — Telephone Encounter (Signed)
Pt returned my call and left a message.  States she has had two antibiotic tablets but not feeling any better but knows it is early in treatment.  Hopes to feel better in the next few days.  Still very upset that she was told Dr. Regis Bill could not see her on Friday and Monday.  Was also told there was no Saturday clinic.  Will forward to Dr. Regis Bill.

## 2017-02-12 LAB — URINE CULTURE

## 2017-02-12 NOTE — Progress Notes (Signed)
Chief Complaint  Patient presents with  . Follow-up    HPI: Christina Myers 76 y.o. comes in for follow-up of what turns out to be pyelonephritis after days of fever chills and vomiting and nausea. Her fever is gone over the last 24 hours without chills. She still has significant nausea and decreased appetite but is taking fluids well. No diarrhea. No flank pain. No history of the above. Wonders when she's going to get her energy back and be back to baseline. ROS: See pertinent positives and negatives per HPI.  Past Medical History:  Diagnosis Date  . COLONIC POLYPS, HX OF 08/30/2007   Qualifier: Diagnosis of  By: Rogue Bussing CMA, Maryann Alar    . Hx of colonic polyps   . Hypertension   . Hypothyroidism   . OSTEOARTHRITIS, HAND 04/24/2009   Qualifier: Diagnosis of  By: Regis Bill MD, Standley Brooking   . PVC (premature ventricular contraction)    [facility] 7/03  . Seasonal allergies   . Skin cancer    hx of bcca of face  . SKIN CANCER, HX OF 02/02/2008   Qualifier: Diagnosis of  By: Regis Bill MD, Standley Brooking     Family History  Problem Relation Age of Onset  . Cancer Other     colon, prostate   . Diabetes Other   . Stroke Other   . Heart disease Other   . Colon cancer Mother     Social History   Social History  . Marital status: Married    Spouse name: N/A  . Number of children: N/A  . Years of education: N/A   Social History Main Topics  . Smoking status: Former Research scientist (life sciences)  . Smokeless tobacco: Never Used  . Alcohol use Yes     Comment: rarely  . Drug use: No  . Sexual activity: Not on file   Other Topics Concern  . Not on file   Social History Narrative   Married    Former smoker   Set designer and teaches tap dancing.    No etoh   HH of 2     Outpatient Medications Prior to Visit  Medication Sig Dispense Refill  . aspirin 81 MG tablet Take 81 mg by mouth daily.      [name] BIOTIN PO Take by mouth.    . Cholecalciferol (VITAMIN D3) 2000 units TABS Take 2,000 Units by mouth  daily.    . ciprofloxacin (CIPRO) 500 MG tablet Take 1 tablet (500 mg total) by mouth 2 (two) times daily. For UTI 20 tablet 0  . Eluxadoline (VIBERZI) 100 MG TABS Take 1 tablet by mouth daily.    [name] estradiol (ESTRACE) 0.1 MG/GM vaginal cream Place 2 g vaginally daily.      . meloxicam (MOBIC) 15 MG tablet TK 1 T PO QD WF  3  . simvastatin (ZOCOR) 20 MG tablet TAKE 1 TABLET BY MOUTH AT BEDTIME 90 tablet 2  . SYNTHROID 100 MCG tablet TAKE 1 TABLET BY MOUTH EVERY DAY 90 tablet 1   No facility-administered medications prior to visit.      EXAM:  BP 110/62 (BP Location: Right Arm, Patient Position: Sitting, Cuff Size: Normal)   Pulse (!) 59   There is no height or weight on file to calculate BMI.  GENERAL: vitals reviewed and listed above, alert, oriented, appears well hydrated and in no acute distress HEENT: atraumatic, conjunctiva  clear, no obvious abnormalities on inspection of external nose and ears NECK: no obvious masses  on inspection palpation  LUNGS: clear to auscultation bilaterally, no wheezes, rales or rhonchi, good air movement CV: HRRR, no clubbing cyanosis or  peripheral edema nl cap refill  Abdomen:  Sof,t normal bowel sounds without hepatosplenomegaly, no guarding rebound or masses no CVA tenderness MS: moves all extremities without noticeable focal  abnormality PSYCH: pleasant and cooperative, no obvious depression or anxiety  ucx e coli s to cipro  Declines weight today. Urinalysis clear except for a little bit of blood and protein ASSESSMENT AND PLAN:  Discussed the following assessment and plan:  Pyelonephritis - Plan: POCT Urinalysis Dipstick (Automated), Basic metabolic panel, Basic metabolic panel  Acute renal insufficiency - prob prerenal  checklab today  - Plan: POCT Urinalysis Dipstick (Automated), Basic metabolic panel, Basic metabolic panel  Fever, unspecified fever cause - resolved  - Plan: POCT Urinalysis Dipstick (Automated), Basic metabolic panel,  Basic metabolic panel Numbers today clinically much improved although she is was hoping to feel better a lot sooner. Expectant management lab today to ensure improvement if fever recurs or no improvement consider getting renal ultrasound. Plan follow-up depending on lab and how she's doing. -Patient advised to return or notify health care team  if symptoms worsen ,persist or new concerns arise.  Patient Instructions   You have  a  Kidney infection  stay on the antibiotic .    Checking kidney function  To make sure getting better on the treatment.   If you fever returns over the weekend conctact oncall service .   I expect you to be feeling a lot better  After the weekend .  But fatigue can last longer.   Plan ROV depending on labs an how you are doing.     Pyelonephritis, Adult Introduction Pyelonephritis is a kidney infection. The kidneys are the organs that filter a person's blood and move waste out of the bloodstream and into the urine. Urine passes from the kidneys, through the ureters, and into the bladder. There are two main types of pyelonephritis:  Infections that come on quickly without any warning (acute pyelonephritis).  Infections that last for a long period of time (chronic pyelonephritis). In most cases, the infection clears up with treatment and does not cause further problems. More severe infections or chronic infections can sometimes spread to the bloodstream or lead to other problems with the kidneys. What are the causes? This condition is usually caused by:  Bacteria traveling from the bladder to the kidney through infected urine. The urine in the bladder can become infected with bacteria from:  Bladder infection (cystitis).  Inflammation of the prostate gland (prostatitis).  Sexual intercourse, in females.  Bacteria traveling from the bloodstream to the kidney. What increases the risk? This condition is more likely to develop in:  Pregnant women.  Older  people.  People who have diabetes.  People who have kidney stones or bladder stones.  People who have other abnormalities of the kidney or ureter.  People who have a catheter placed in the bladder.  People who have cancer.  People who are sexually active.  Women who use spermicides.  People who have had a prior urinary tract infection. What are the signs or symptoms? Symptoms of this condition include:  Frequent urination.  Strong or persistent urge to urinate.  Burning or stinging when urinating.  Abdominal pain.  Back pain.  Pain in the side or flank area.  Fever.  Chills.  Blood in the urine, or dark urine.  Nausea.  Vomiting. How  is this diagnosed? This condition may be diagnosed based on:  Medical history and physical exam.  Urine tests.  Blood tests. You may also have imaging tests of the kidneys, such as an ultrasound or CT scan. How is this treated? Treatment for this condition may depend on the severity of the infection.  If the infection is mild and is found early, you may be treated with antibiotic medicines taken by mouth. You will need to drink fluids to remain hydrated.  If the infection is more severe, you may need to stay in the hospital and receive antibiotics given directly into a vein through an IV tube. You may also need to receive fluids through an IV tube if you are not able to remain hydrated. After your hospital stay, you may need to take oral antibiotics for a period of time. Other treatments may be required, depending on the cause of the infection. Follow these instructions at home: Medicines  Take over-the-counter and prescription medicines only as told by your health care provider.  If you were prescribed an antibiotic medicine, take it as told by your health care provider. Do not stop taking the antibiotic even if you start to feel better. General instructions  Drink enough fluid to keep your urine clear or pale  yellow.  Avoid caffeine, tea, and carbonated beverages. They tend to irritate the bladder.  Urinate often. Avoid holding in urine for long periods of time.  Urinate before and after sex.  After a bowel movement, women should cleanse from front to back. Use each tissue only once.  Keep all follow-up visits as told by your health care provider. This is important. Contact a health care provider if:  Your symptoms do not get better after 2 days of treatment.  Your symptoms get worse.  You have a fever. Get help right away if:  You are unable to take your antibiotics or fluids.  You have shaking chills.  You vomit.  You have severe flank or back pain.  You have extreme weakness or fainting. This information is not intended to replace advice given to you by your health care provider. Make sure you discuss any questions you have with your health care provider. Document Released: 12/08/2005 Document Revised: 05/15/2016 Document Reviewed: 04/02/2015  2017 Elsevier     Mariann Laster K. Dejaun Vidrio M.D.  Lab Results  Component Value Date   WBC 17.2 (H) 02/10/2017   HGB 13.4 02/10/2017   HCT 38.6 02/10/2017   PLT 275.0 02/10/2017   GLUCOSE 110 (H) 02/13/2017   CHOL 174 11/19/2016   TRIG 192.0 (H) 11/19/2016   HDL 52.70 11/19/2016   LDLDIRECT 92.3 10/18/2014   LDLCALC 83 11/19/2016   ALT 43 (H) 02/10/2017   AST 31 02/10/2017   NA 142 02/13/2017   K 3.5 02/13/2017   CL 106 02/13/2017   CREATININE 0.98 02/13/2017   BUN 19 02/13/2017   CO2 28 02/13/2017   TSH 1.54 11/19/2016   INR 1.0 ratio 11/27/2010   imporved cratinine

## 2017-02-12 NOTE — Telephone Encounter (Signed)
See result note.  

## 2017-02-12 NOTE — Telephone Encounter (Signed)
See result note   Will paln on  Repeating labs at her OV.

## 2017-02-13 ENCOUNTER — Encounter: Payer: Self-pay | Admitting: Internal Medicine

## 2017-02-13 ENCOUNTER — Ambulatory Visit (INDEPENDENT_AMBULATORY_CARE_PROVIDER_SITE_OTHER): Payer: Medicare Other | Admitting: Internal Medicine

## 2017-02-13 VITALS — BP 110/62 | HR 59

## 2017-02-13 DIAGNOSIS — N289 Disorder of kidney and ureter, unspecified: Secondary | ICD-10-CM | POA: Diagnosis not present

## 2017-02-13 DIAGNOSIS — R509 Fever, unspecified: Secondary | ICD-10-CM | POA: Diagnosis not present

## 2017-02-13 DIAGNOSIS — N12 Tubulo-interstitial nephritis, not specified as acute or chronic: Secondary | ICD-10-CM

## 2017-02-13 LAB — BASIC METABOLIC PANEL
BUN: 19 mg/dL (ref 6–23)
CO2: 28 meq/L (ref 19–32)
Calcium: 8.1 mg/dL — ABNORMAL LOW (ref 8.4–10.5)
Chloride: 106 mEq/L (ref 96–112)
Creatinine, Ser: 0.98 mg/dL (ref 0.40–1.20)
GFR: 58.38 mL/min — ABNORMAL LOW (ref >60.00)
GLUCOSE: 110 mg/dL — AB (ref 70–99)
POTASSIUM: 3.5 meq/L (ref 3.5–5.1)
SODIUM: 142 meq/L (ref 135–145)

## 2017-02-13 LAB — POC URINALSYSI DIPSTICK (AUTOMATED)
Bilirubin, UA: NEGATIVE
Blood, UA: 10
GLUCOSE UA: NEGATIVE
Ketones, UA: NEGATIVE
LEUKOCYTES UA: NEGATIVE
NITRITE UA: NEGATIVE
Protein, UA: 15
Spec Grav, UA: 1.02
UROBILINOGEN UA: 0.2
pH, UA: 6

## 2017-02-13 NOTE — Patient Instructions (Addendum)
You have  a  Kidney infection  stay on the antibiotic .    Checking kidney function  To make sure getting better on the treatment.   If you fever returns over the weekend conctact oncall service .   I expect you to be feeling a lot better  After the weekend .  But fatigue can last longer.   Plan ROV depending on labs an how you are doing.     Pyelonephritis, Adult Introduction Pyelonephritis is a kidney infection. The kidneys are the organs that filter a person's blood and move waste out of the bloodstream and into the urine. Urine passes from the kidneys, through the ureters, and into the bladder. There are two main types of pyelonephritis:  Infections that come on quickly without any warning (acute pyelonephritis).  Infections that last for a long period of time (chronic pyelonephritis). In most cases, the infection clears up with treatment and does not cause further problems. More severe infections or chronic infections can sometimes spread to the bloodstream or lead to other problems with the kidneys. What are the causes? This condition is usually caused by:  Bacteria traveling from the bladder to the kidney through infected urine. The urine in the bladder can become infected with bacteria from:  Bladder infection (cystitis).  Inflammation of the prostate gland (prostatitis).  Sexual intercourse, in females.  Bacteria traveling from the bloodstream to the kidney. What increases the risk? This condition is more likely to develop in:  Pregnant women.  Older people.  People who have diabetes.  People who have kidney stones or bladder stones.  People who have other abnormalities of the kidney or ureter.  People who have a catheter placed in the bladder.  People who have cancer.  People who are sexually active.  Women who use spermicides.  People who have had a prior urinary tract infection. What are the signs or symptoms? Symptoms of this condition  include:  Frequent urination.  Strong or persistent urge to urinate.  Burning or stinging when urinating.  Abdominal pain.  Back pain.  Pain in the side or flank area.  Fever.  Chills.  Blood in the urine, or dark urine.  Nausea.  Vomiting. How is this diagnosed? This condition may be diagnosed based on:  Medical history and physical exam.  Urine tests.  Blood tests. You may also have imaging tests of the kidneys, such as an ultrasound or CT scan. How is this treated? Treatment for this condition may depend on the severity of the infection.  If the infection is mild and is found early, you may be treated with antibiotic medicines taken by mouth. You will need to drink fluids to remain hydrated.  If the infection is more severe, you may need to stay in the hospital and receive antibiotics given directly into a vein through an IV tube. You may also need to receive fluids through an IV tube if you are not able to remain hydrated. After your hospital stay, you may need to take oral antibiotics for a period of time. Other treatments may be required, depending on the cause of the infection. Follow these instructions at home: Medicines  Take over-the-counter and prescription medicines only as told by your health care provider.  If you were prescribed an antibiotic medicine, take it as told by your health care provider. Do not stop taking the antibiotic even if you start to feel better. General instructions  Drink enough fluid to keep your urine clear or  pale yellow.  Avoid caffeine, tea, and carbonated beverages. They tend to irritate the bladder.  Urinate often. Avoid holding in urine for long periods of time.  Urinate before and after sex.  After a bowel movement, women should cleanse from front to back. Use each tissue only once.  Keep all follow-up visits as told by your health care provider. This is important. Contact a health care provider if:  Your symptoms  do not get better after 2 days of treatment.  Your symptoms get worse.  You have a fever. Get help right away if:  You are unable to take your antibiotics or fluids.  You have shaking chills.  You vomit.  You have severe flank or back pain.  You have extreme weakness or fainting. This information is not intended to replace advice given to you by your health care provider. Make sure you discuss any questions you have with your health care provider. Document Released: 12/08/2005 Document Revised: 05/15/2016 Document Reviewed: 04/02/2015  2017 Elsevier

## 2017-02-16 ENCOUNTER — Ambulatory Visit: Payer: Medicare Other | Admitting: Internal Medicine

## 2017-02-18 ENCOUNTER — Encounter: Payer: Self-pay | Admitting: Internal Medicine

## 2017-02-18 ENCOUNTER — Telehealth: Payer: Self-pay | Admitting: Internal Medicine

## 2017-02-18 ENCOUNTER — Ambulatory Visit (INDEPENDENT_AMBULATORY_CARE_PROVIDER_SITE_OTHER): Payer: Medicare Other | Admitting: Internal Medicine

## 2017-02-18 VITALS — BP 110/60 | HR 59

## 2017-02-18 DIAGNOSIS — N12 Tubulo-interstitial nephritis, not specified as acute or chronic: Secondary | ICD-10-CM

## 2017-02-18 DIAGNOSIS — R194 Change in bowel habit: Secondary | ICD-10-CM

## 2017-02-18 DIAGNOSIS — R6883 Chills (without fever): Secondary | ICD-10-CM | POA: Diagnosis not present

## 2017-02-18 LAB — POC URINALSYSI DIPSTICK (AUTOMATED)
BILIRUBIN UA: NEGATIVE
Glucose, UA: NEGATIVE
KETONES UA: NEGATIVE
Nitrite, UA: NEGATIVE
PH UA: 5.5
Protein, UA: NEGATIVE
Spec Grav, UA: 1.02
Urobilinogen, UA: 0.2

## 2017-02-18 MED ORDER — CIPROFLOXACIN HCL 500 MG PO TABS: 500.0000 mg | ORAL_TABLET | Freq: Two times a day (BID) | ORAL | 0 refills | Status: DC

## 2017-02-18 NOTE — Telephone Encounter (Signed)
Pt has not taken her temp today. Pt felt chill last night

## 2017-02-18 NOTE — Telephone Encounter (Signed)
Spoke with pt and advised that if she is continuing to have chills she needs to be seen sooner than Friday per Dr. Regis Bill. Pt agrees. Appt rescheduled to today. Nothing further needed.

## 2017-02-18 NOTE — Patient Instructions (Signed)
Your exam is reassuring today and the fact that your appetite is improving. However because of the chills I want she did continue the antibiotic for a full 14 day treatment. Sending an antibiotic for more days to your pharmacy. You'll be contacted about getting a renal or kidney ultrasound scan to make sure there is no obstruction or abscesses. This will probably be normal but will be reassuring that no further evaluation needs to be done area You can take MiraLAX as needed should be safe. Contact us if not continuing to improve. We will make a plan for follow-up depending on what the ultrasound shows .

## 2017-02-18 NOTE — Progress Notes (Signed)
Chief Complaint  Patient presents with  . Follow-up    pt has been constipated since sunday/ no BM since then     HPI: Christina Myers 76 y.o.   sda  Fu pyelomephritis  Called to day and having chills again     Should be on last 2 days of medicine .She goes to comfortable and then wakes up cold covers off and then asked for the blankets off. But no documented fever but may be low-grade fever. Her appetite is getting much better she is on day 7 of Cipro. She still a bit tired and falls asleep after lunch.  Is concerned about decreased bowel movements had some on the weekend but none since then. No abdominal pain  no bloating   Had about 7-9 days with minimal eating rpedating this .    Appetite has picked up in the past 4-5 days   She states she's lost about 7 pounds during his illness and is actually pleased about it but is trying to not gain it back but ate some normal food today. ROS: See pertinent positives and negatives per HPI. No cp sob  Flank pain or cramps   Past Medical History:  Diagnosis Date  . COLONIC POLYPS, HX OF 08/30/2007   Qualifier: Diagnosis of  By: Rogue Bussing CMA, Maryann Alar    . Hx of colonic polyps   . Hypertension   . Hypothyroidism   . OSTEOARTHRITIS, HAND 04/24/2009   Qualifier: Diagnosis of  By: Regis Bill MD, Standley Brooking   . PVC (premature ventricular contraction)    [facility] evaluation 7/03  . Seasonal allergies   . Skin cancer    hx of bcca of face  . SKIN CANCER, HX OF 02/02/2008   Qualifier: Diagnosis of  By: Regis Bill MD, Standley Brooking     Family History  Problem Relation Age of Onset  . Cancer Other     colon, prostate   . Diabetes Other   . Stroke Other   . Heart disease Other   . Colon cancer Mother     Social History   Social History  . Marital status: Married    Spouse name: N/A  . Number of children: N/A  . Years of education: N/A   Social History Main Topics  . Smoking status: Former Research scientist (life sciences)  . Smokeless tobacco: Never Used  . Alcohol use Yes   Comment: rarely  . Drug use: No  . Sexual activity: Not Asked   Other Topics Concern  . None   Social History Narrative   Married    Former smoker   Set designer and teaches tap dancing.    No etoh   HH of 2     Outpatient Medications Prior to Visit  Medication Sig Dispense Refill  . aspirin 81 MG tablet Take 81 mg by mouth daily.      [name] BIOTIN PO Take by mouth.    . Cholecalciferol (VITAMIN D3) 2000 units TABS Take 2,000 Units by mouth daily.    . Eluxadoline (VIBERZI) 100 MG TABS Take 1 tablet by mouth daily.    . simvastatin (ZOCOR) 20 MG tablet TAKE 1 TABLET BY MOUTH AT BEDTIME 90 tablet 2  . SYNTHROID 100 MCG tablet TAKE 1 TABLET BY MOUTH EVERY DAY 90 tablet 1  . ciprofloxacin (CIPRO) 500 MG tablet Take 1 tablet (500 mg total) by mouth 2 (two) times daily. For UTI 20 tablet 0  . estradiol (ESTRACE) 0.1 MG/GM vaginal cream Place 2  g vaginally daily.      . meloxicam (MOBIC) 15 MG tablet TK 1 T PO QD WF  3   No facility-administered medications prior to visit.      EXAM:  BP 110/60 (BP Location: Right Arm, Patient Position: Sitting, Cuff Size: Normal)   Pulse (!) 59   There is no height or weight on file to calculate BMI.  GENERAL: vitals reviewed and listed above, alert, oriented, appears well hydrated and in no acute distressShe looks well today. HEENT: atraumatic, conjunctiva  clear, no obvious abnormalities on inspection of external nose and ears NECK: no obvious masses on inspection palpation   nl respirations  CV: HRRR, no clubbing cyanosis or  peripheral edema nl cap refill  Abdomen:  Sof,t normal bowel sounds without hepatosplenomegaly, no guarding rebound or masses no CVA tenderness MS: moves all extremities without noticeable focal  abnormality PSYCH: pleasant and cooperative, no obvious depression or anxiety Lab Results  Component Value Date   WBC 17.2 (H) 02/10/2017   HGB 13.4 02/10/2017   HCT 38.6 02/10/2017   PLT 275.0 02/10/2017   GLUCOSE 110 (H)  02/13/2017   CHOL 174 11/19/2016   TRIG 192.0 (H) 11/19/2016   HDL 52.70 11/19/2016   LDLDIRECT 92.3 10/18/2014   LDLCALC 83 11/19/2016   ALT 43 (H) 02/10/2017   AST 31 02/10/2017   NA 142 02/13/2017   K 3.5 02/13/2017   CL 106 02/13/2017   CREATININE 0.98 02/13/2017   BUN 19 02/13/2017   CO2 28 02/13/2017   TSH 1.54 11/19/2016   INR 1.0 ratio 11/27/2010   ua trc bl leuk ASSESSMENT AND PLAN:  Discussed the following assessment and plan:  Chills - Plan: POCT Urinalysis Dipstick (Automated), US Renal  Pyelonephritis - day 8 of rx  - Plan: POCT Urinalysis Dipstick (Automated), US Renal  Decreased frequency of bowel movements - no pain prob related to uti and poor intake for  over a week.  disc plan  Doesn't really sound like she's of relapsed but is not totally better and isn't having a full-blown fever. Her appetite is improvement and her labs in every other way last time checked one improvement. Will extend her treatment to a 14 day trauma treatment. We'll check renal ultrasound that should be normal discussed explanation. I doubt she has an abscess or obstruction but will Plan ultrasound o  expectant management. Plan follow-up depending on results and how she is doing. -Patient advised to return or notify health care team  if symptoms worsen ,persist or new concerns arise. In th einterim   Patient Instructions  Your exam is reassuring today and the fact that your appetite is improving. However because of the chills I want she did continue the antibiotic for a full 14 day treatment. Sending an antibiotic for more days to your pharmacy. You'll be contacted about getting a renal or kidney ultrasound scan to make sure there is no obstruction or abscesses. This will probably be normal but will be reassuring that no further evaluation needs to be done area You can take MiraLAX as needed should be safe. Contact us if not continuing to improve. We will make a plan for follow-up depending  on what the ultrasound shows .     Standley Brooking. Panosh M.D.

## 2017-02-20 ENCOUNTER — Ambulatory Visit
Admission: RE | Admit: 2017-02-20 | Discharge: 2017-02-20 | Disposition: A | Discharge status: Home / Self Care | Payer: Medicare Other | Source: Ambulatory Visit | Attending: Internal Medicine | Admitting: Internal Medicine

## 2017-02-20 ENCOUNTER — Ambulatory Visit: Payer: Medicare Other | Admitting: Internal Medicine

## 2017-02-20 DIAGNOSIS — R6883 Chills (without fever): Secondary | ICD-10-CM

## 2017-02-20 DIAGNOSIS — N10 Acute tubulo-interstitial nephritis: Secondary | ICD-10-CM | POA: Diagnosis not present

## 2017-02-20 DIAGNOSIS — N12 Tubulo-interstitial nephritis, not specified as acute or chronic: Secondary | ICD-10-CM

## 2017-02-22 ENCOUNTER — Other Ambulatory Visit: Payer: Self-pay | Admitting: Internal Medicine

## 2017-02-25 ENCOUNTER — Other Ambulatory Visit: Payer: Self-pay | Admitting: Internal Medicine

## 2017-02-25 DIAGNOSIS — Z1231 Encounter for screening mammogram for malignant neoplasm of breast: Secondary | ICD-10-CM

## 2017-02-27 ENCOUNTER — Ambulatory Visit (INDEPENDENT_AMBULATORY_CARE_PROVIDER_SITE_OTHER): Payer: Medicare Other | Admitting: Internal Medicine

## 2017-02-27 ENCOUNTER — Encounter: Payer: Self-pay | Admitting: Internal Medicine

## 2017-02-27 VITALS — BP 110/78 | HR 74 | Ht 62.0 in | Wt 124.0 lb

## 2017-02-27 DIAGNOSIS — R8290 Unspecified abnormal findings in urine: Secondary | ICD-10-CM | POA: Diagnosis not present

## 2017-02-27 DIAGNOSIS — N12 Tubulo-interstitial nephritis, not specified as acute or chronic: Secondary | ICD-10-CM

## 2017-02-27 DIAGNOSIS — Z09 Encounter for follow-up examination after completed treatment for conditions other than malignant neoplasm: Secondary | ICD-10-CM

## 2017-02-27 LAB — POCT URINALYSIS DIPSTICK
Bilirubin, UA: NEGATIVE
GLUCOSE UA: NEGATIVE
Ketones, UA: NEGATIVE
NITRITE UA: NEGATIVE
RBC UA: NEGATIVE
Spec Grav, UA: 1.015
UROBILINOGEN UA: NEGATIVE
pH, UA: 6

## 2017-02-27 NOTE — Progress Notes (Signed)
Chief Complaint  Patient presents with  . Follow-up    HPI: Christina Myers 76 y.o.   Comes in fro fu visit sp rx pyelo e coli .  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  bms more frequent a little at a time.   no new sx and energy level is coming back  ROS: See pertinent positives and negatives per HPI.  Past Medical History:  Diagnosis Date  . COLONIC POLYPS, HX OF 08/30/2007   Qualifier: Diagnosis of  By: Rogue Bussing CMA, Maryann Alar    . Hx of colonic polyps   . Hypertension   . Hypothyroidism   . OSTEOARTHRITIS, HAND 04/24/2009   Qualifier: Diagnosis of  By: Regis Bill MD, Standley Brooking   . PVC (premature ventricular contraction)    [facility] evaluation 7/03  . Seasonal allergies   . Skin cancer    hx of bcca of face  . SKIN CANCER, HX OF 02/02/2008   Qualifier: Diagnosis of  By: Regis Bill MD, Standley Brooking     Family History  Problem Relation Age of Onset  . Cancer Other     colon, prostate   . Diabetes Other   . Stroke Other   . Heart disease Other   . Colon cancer Mother     Social History   Social History  . Marital status: Married    Spouse name: N/A  . Number of children: N/A  . Years of education: N/A   Social History Main Topics  . Smoking status: Former Research scientist (life sciences)  . Smokeless tobacco: Never Used  . Alcohol use Yes     Comment: rarely  . Drug use: No  . Sexual activity: Not Asked   Other Topics Concern  . None   Social History Narrative   Married    Former smoker   Set designer and teaches tap dancing.    No etoh   HH of 2     Outpatient Medications Prior to Visit  Medication Sig Dispense Refill  . aspirin 81 MG tablet Take 81 mg by mouth daily.      [name] BIOTIN PO Take by mouth.    . Cholecalciferol (VITAMIN D3) 2000 units TABS Take 2,000 Units by mouth daily.    [name] estradiol (ESTRACE) 0.1 MG/GM vaginal cream Place 2 g vaginally daily.      . simvastatin (ZOCOR) 20 MG tablet TAKE 1 TABLET BY MOUTH  AT BEDTIME 90 tablet 2  . SYNTHROID 100 MCG tablet TAKE 1 TABLET BY MOUTH EVERY DAY 90 tablet 2  . Eluxadoline (VIBERZI) 100 MG TABS Take 1 tablet by mouth daily.    . meloxicam (MOBIC) 15 MG tablet TK 1 T PO QD WF  3  . ciprofloxacin (CIPRO) 500 MG tablet Take 1 tablet (500 mg total) by mouth 2 (two) times daily. For UTI 8 tablet 0   No facility-administered medications prior to visit.      EXAM:  BP 110/78 (BP Location: Right Arm, Patient Position: Sitting, Cuff Size: Normal)   Pulse 74   Ht 5\' 2"  (1.575 m)   Wt 124 lb (56.2 kg) Comment: home weight per pt  BMI 22.68 kg/m   Body mass index is 22.68 kg/m.  GENERAL: vitals reviewed and listed above, alert, oriented, appears well hydrated and in no acute distress  PSYCH: pleasant and cooperative, no obvious depression or anxiety looks well today  poct 1 + leuk protein  ASSESSMENT AND PLAN:  Discussed the following assessment and plan:  Pyelonephritis - clincally better   of  antibiotic finishsed 14 days course neg US renal  Abnormal urinalysis - 1+ leuk but  feels better   check ucx  as a precaution - Plan: POCT urinalysis dipstick, Urine culture  Follow-up examination  -Patient advised to return or notify health care team  if symptoms worsen ,persist or new concerns arise.  Patient Instructions  Recheck prn recurrence of sx  Urine culture repeat pending    Standley Brooking. Panosh M.D.

## 2017-02-27 NOTE — Patient Instructions (Signed)
Recheck prn recurrence of sx  Urine culture repeat pending

## 2017-03-02 LAB — URINE CULTURE

## 2017-03-03 DIAGNOSIS — H43812 Vitreous degeneration, left eye: Secondary | ICD-10-CM | POA: Diagnosis not present

## 2017-03-03 DIAGNOSIS — H35423 Microcystoid degeneration of retina, bilateral: Secondary | ICD-10-CM | POA: Diagnosis not present

## 2017-03-03 DIAGNOSIS — H353221 Exudative age-related macular degeneration, left eye, with active choroidal neovascularization: Secondary | ICD-10-CM | POA: Diagnosis not present

## 2017-03-03 DIAGNOSIS — H353112 Nonexudative age-related macular degeneration, right eye, intermediate dry stage: Secondary | ICD-10-CM | POA: Diagnosis not present

## 2017-03-13 NOTE — Addendum Note (Signed)
Addended by: Milford Cage on: 03/13/2017 04:58 PM   Modules accepted: Orders

## 2017-03-16 ENCOUNTER — Other Ambulatory Visit: Payer: Medicare Other

## 2017-03-16 ENCOUNTER — Other Ambulatory Visit: Payer: Self-pay | Admitting: Emergency Medicine

## 2017-03-16 DIAGNOSIS — R8290 Unspecified abnormal findings in urine: Secondary | ICD-10-CM

## 2017-03-16 LAB — URINALYSIS, MICROSCOPIC ONLY

## 2017-03-17 ENCOUNTER — Other Ambulatory Visit: Payer: Medicare Other

## 2017-03-17 DIAGNOSIS — R8290 Unspecified abnormal findings in urine: Secondary | ICD-10-CM | POA: Diagnosis not present

## 2017-03-18 LAB — URINE CULTURE: Organism ID, Bacteria: NO GROWTH

## 2017-03-19 ENCOUNTER — Other Ambulatory Visit: Payer: Medicare Other

## 2017-03-30 ENCOUNTER — Ambulatory Visit: Payer: Medicare Other

## 2017-04-08 ENCOUNTER — Ambulatory Visit: Payer: Medicare Other

## 2017-04-08 DIAGNOSIS — H353112 Nonexudative age-related macular degeneration, right eye, intermediate dry stage: Secondary | ICD-10-CM | POA: Diagnosis not present

## 2017-04-08 DIAGNOSIS — H35423 Microcystoid degeneration of retina, bilateral: Secondary | ICD-10-CM | POA: Diagnosis not present

## 2017-04-08 DIAGNOSIS — H43812 Vitreous degeneration, left eye: Secondary | ICD-10-CM | POA: Diagnosis not present

## 2017-04-08 DIAGNOSIS — H353221 Exudative age-related macular degeneration, left eye, with active choroidal neovascularization: Secondary | ICD-10-CM | POA: Diagnosis not present

## 2017-04-16 ENCOUNTER — Ambulatory Visit
Admission: RE | Admit: 2017-04-16 | Discharge: 2017-04-16 | Disposition: A | Discharge status: Home / Self Care | Payer: Medicare Other | Source: Ambulatory Visit | Attending: Internal Medicine | Admitting: Internal Medicine

## 2017-04-16 DIAGNOSIS — Z1231 Encounter for screening mammogram for malignant neoplasm of breast: Secondary | ICD-10-CM | POA: Diagnosis not present

## 2017-04-20 DIAGNOSIS — K580 Irritable bowel syndrome with diarrhea: Secondary | ICD-10-CM | POA: Diagnosis not present

## 2017-04-21 ENCOUNTER — Encounter: Payer: Self-pay | Admitting: Family Medicine

## 2017-05-12 ENCOUNTER — Ambulatory Visit: Payer: Medicare Other | Admitting: Internal Medicine

## 2017-05-12 DIAGNOSIS — L292 Pruritus vulvae: Secondary | ICD-10-CM | POA: Diagnosis not present

## 2017-05-12 DIAGNOSIS — R350 Frequency of micturition: Secondary | ICD-10-CM | POA: Diagnosis not present

## 2017-05-12 DIAGNOSIS — R319 Hematuria, unspecified: Secondary | ICD-10-CM | POA: Diagnosis not present

## 2017-05-12 NOTE — Progress Notes (Deleted)
No chief complaint on file.   HPI: Christina Myers 76 y.o.  sda  Hx of pyelonephritis  Comes in today with LUTS sx  ROS: See pertinent positives and negatives per HPI.  Past Medical History:  Diagnosis Date  . COLONIC POLYPS, HX OF 08/30/2007   Qualifier: Diagnosis of  By: Rogue Bussing CMA, Maryann Alar    . Hx of colonic polyps   . Hypertension   . Hypothyroidism   . OSTEOARTHRITIS, HAND 04/24/2009   Qualifier: Diagnosis of  By: Regis Bill MD, Standley Brooking   . PVC (premature ventricular contraction)    [facility] 7/03  . Seasonal allergies   . Skin cancer    hx of bcca of face  . SKIN CANCER, HX OF 02/02/2008   Qualifier: Diagnosis of  By: Regis Bill MD, Standley Brooking     Family History  Problem Relation Age of Onset  . Cancer Other        colon, prostate   . Diabetes Other   . Stroke Other   . Heart disease Other   . Colon cancer Mother     Social History   Social History  . Marital status: Married    Spouse name: N/A  . Number of children: N/A  . Years of education: N/A   Social History Main Topics  . Smoking status: Former Research scientist (life sciences)  . Smokeless tobacco: Never Used  . Alcohol use Yes     Comment: rarely  . Drug use: No  . Sexual activity: Not on file   Other Topics Concern  . Not on file   Social History Narrative   Married    Former smoker   Set designer and teaches tap dancing.    No etoh   HH of 2     Outpatient Medications Prior to Visit  Medication Sig Dispense Refill  . aspirin 81 MG tablet Take 81 mg by mouth daily.      [name] BIOTIN PO Take by mouth.    . Cholecalciferol (VITAMIN D3) 2000 units TABS Take 2,000 Units by mouth daily.    . Eluxadoline (VIBERZI) 100 MG TABS Take 1 tablet by mouth daily.    [name] estradiol (ESTRACE) 0.1 MG/GM vaginal cream Place 2 g vaginally daily.      . meloxicam (MOBIC) 15 MG tablet TK 1 T PO QD WF  3  . simvastatin (ZOCOR) 20 MG tablet TAKE 1 TABLET BY MOUTH AT BEDTIME 90 tablet 2  . SYNTHROID 100 MCG tablet TAKE 1 TABLET BY MOUTH  EVERY DAY 90 tablet 2   No facility-administered medications prior to visit.      EXAM:  There were no vitals taken for this visit.  There is no height or weight on file to calculate BMI.  GENERAL: vitals reviewed and listed above, alert, oriented, appears well hydrated and in no acute distress HEENT: atraumatic, conjunctiva  clear, no obvious abnormalities on inspection of external nose and ears OP : no lesion edema or exudate  NECK: no obvious masses on inspection palpation  LUNGS: clear to auscultation bilaterally, no wheezes, rales or rhonchi, good air movement CV: HRRR, no clubbing cyanosis or  peripheral edema nl cap refill  MS: moves all extremities without noticeable focal  abnormality PSYCH: pleasant and cooperative, no obvious depression or anxiety  ASSESSMENT AND PLAN:  Discussed the following assessment and plan:  No diagnosis found.  -Patient advised to return or notify health care team  if symptoms worsen ,persist or new concerns arise.  There are no Patient Instructions on file for this visit.   Standley Brooking. Velora Horstman M.D.

## 2017-05-13 ENCOUNTER — Telehealth: Payer: Self-pay

## 2017-05-13 NOTE — Telephone Encounter (Signed)
Received PA request for brand name Synthroid. PA approved & form faxed back to pharmacy.

## 2017-05-26 DIAGNOSIS — H43812 Vitreous degeneration, left eye: Secondary | ICD-10-CM | POA: Diagnosis not present

## 2017-05-26 DIAGNOSIS — H353221 Exudative age-related macular degeneration, left eye, with active choroidal neovascularization: Secondary | ICD-10-CM | POA: Diagnosis not present

## 2017-05-26 DIAGNOSIS — H35423 Microcystoid degeneration of retina, bilateral: Secondary | ICD-10-CM | POA: Diagnosis not present

## 2017-05-26 DIAGNOSIS — H353112 Nonexudative age-related macular degeneration, right eye, intermediate dry stage: Secondary | ICD-10-CM | POA: Diagnosis not present

## 2017-06-11 DIAGNOSIS — N390 Urinary tract infection, site not specified: Secondary | ICD-10-CM | POA: Diagnosis not present

## 2017-06-11 DIAGNOSIS — R350 Frequency of micturition: Secondary | ICD-10-CM | POA: Diagnosis not present

## 2017-06-12 ENCOUNTER — Encounter: Payer: Self-pay | Admitting: Family Medicine

## 2017-06-12 DIAGNOSIS — R350 Frequency of micturition: Secondary | ICD-10-CM | POA: Diagnosis not present

## 2017-06-16 DIAGNOSIS — L2089 Other atopic dermatitis: Secondary | ICD-10-CM | POA: Diagnosis not present

## 2017-06-16 DIAGNOSIS — L814 Other melanin hyperpigmentation: Secondary | ICD-10-CM | POA: Diagnosis not present

## 2017-06-16 DIAGNOSIS — Z85828 Personal history of other malignant neoplasm of skin: Secondary | ICD-10-CM | POA: Diagnosis not present

## 2017-06-16 DIAGNOSIS — L570 Actinic keratosis: Secondary | ICD-10-CM | POA: Diagnosis not present

## 2017-07-07 DIAGNOSIS — H43812 Vitreous degeneration, left eye: Secondary | ICD-10-CM | POA: Diagnosis not present

## 2017-07-07 DIAGNOSIS — H353112 Nonexudative age-related macular degeneration, right eye, intermediate dry stage: Secondary | ICD-10-CM | POA: Diagnosis not present

## 2017-07-07 DIAGNOSIS — H353221 Exudative age-related macular degeneration, left eye, with active choroidal neovascularization: Secondary | ICD-10-CM | POA: Diagnosis not present

## 2017-08-18 DIAGNOSIS — H353112 Nonexudative age-related macular degeneration, right eye, intermediate dry stage: Secondary | ICD-10-CM | POA: Diagnosis not present

## 2017-08-18 DIAGNOSIS — H43812 Vitreous degeneration, left eye: Secondary | ICD-10-CM | POA: Diagnosis not present

## 2017-08-18 DIAGNOSIS — H35423 Microcystoid degeneration of retina, bilateral: Secondary | ICD-10-CM | POA: Diagnosis not present

## 2017-08-18 DIAGNOSIS — H353221 Exudative age-related macular degeneration, left eye, with active choroidal neovascularization: Secondary | ICD-10-CM | POA: Diagnosis not present

## 2017-09-29 DIAGNOSIS — H35423 Microcystoid degeneration of retina, bilateral: Secondary | ICD-10-CM | POA: Diagnosis not present

## 2017-09-29 DIAGNOSIS — H43812 Vitreous degeneration, left eye: Secondary | ICD-10-CM | POA: Diagnosis not present

## 2017-09-29 DIAGNOSIS — H353221 Exudative age-related macular degeneration, left eye, with active choroidal neovascularization: Secondary | ICD-10-CM | POA: Diagnosis not present

## 2017-09-29 DIAGNOSIS — H353112 Nonexudative age-related macular degeneration, right eye, intermediate dry stage: Secondary | ICD-10-CM | POA: Diagnosis not present

## 2017-10-11 ENCOUNTER — Other Ambulatory Visit: Payer: Self-pay | Admitting: Internal Medicine

## 2017-10-22 ENCOUNTER — Ambulatory Visit (INDEPENDENT_AMBULATORY_CARE_PROVIDER_SITE_OTHER): Payer: Medicare Other | Admitting: *Deleted

## 2017-10-22 DIAGNOSIS — Z23 Encounter for immunization: Secondary | ICD-10-CM

## 2017-11-17 DIAGNOSIS — H35423 Microcystoid degeneration of retina, bilateral: Secondary | ICD-10-CM | POA: Diagnosis not present

## 2017-11-17 DIAGNOSIS — H353112 Nonexudative age-related macular degeneration, right eye, intermediate dry stage: Secondary | ICD-10-CM | POA: Diagnosis not present

## 2017-11-17 DIAGNOSIS — H43813 Vitreous degeneration, bilateral: Secondary | ICD-10-CM | POA: Diagnosis not present

## 2017-11-17 DIAGNOSIS — H353221 Exudative age-related macular degeneration, left eye, with active choroidal neovascularization: Secondary | ICD-10-CM | POA: Diagnosis not present

## 2017-11-18 ENCOUNTER — Other Ambulatory Visit: Payer: Self-pay | Admitting: Internal Medicine

## 2017-12-21 NOTE — Progress Notes (Signed)
Chief Complaint  Patient presents with  . Annual Exam    no new concerns    HPI: Christina Myers 76 y.o. comes in today for Preventive Medicare exam  And med  disease management .   Last uti ;  Over 6   Months.  Seems ok    thyroid med no changes   Dermatologist  Yearly   [facility]  And  Vi berzi   Lower doses  .   73 .   When going   on trip.   Very expensive.  bur helps  Bowel urgency   Health Maintenance  Topic Date Due  . TETANUS/TDAP  12/22/2018  . INFLUENZA VACCINE  Completed  . DEXA SCAN  Completed  . PNA vac Low Risk Adult  Completed   Health Maintenance Review LIFESTYLE:  Exercise:  Dances  Tobacco/ETS:n Alcohol: couple per month  Sugar beverages:n Sleep:8 h Drug use: no HH:2 married  To go on cruise in a few weeks    Hearing:  Good   Vision:  No limitations at present . Last eye check UTD  Safety:  Has smoke detector and wears seat belts.  No firearms. No excess sun exposure. Sees dentist regularly.  Falls:   no  Memory: Felt to be good  , no concern from her or her family.  Depression: No anhedonia unusual crying or depressive symptoms  Nutrition: Eats well balanced diet; adequate calcium and vitamin D. No swallowing chewing problems.  Injury: no major injuries in the last six months.  Other healthcare providers:  Reviewed today .  Social:  Lives with spouse married. Dances and teaches dance   Preventive parameters: up-to-date  Reviewed   ADLS:   There are no problems or need for assistance  driving, feeding, obtaining food, dressing, toileting and bathing, managing money using phone. She is independent.   ROS:  GEN/ HEENT: No fever, significant weight changes sweats headaches vision problems hearing changes, CV/ PULM; No chest pain shortness of breath cough, syncope,edema  change in exercise tolerance. GI /GU: No adominal pain, vomiting, change in bowel habits. No blood in the stool. No significant GU symptoms. SKIN/HEME: ,no acute skin  rashes suspicious lesions or bleeding. No lymphadenopathy, nodules, masses.  NEURO/ PSYCH:  No neurologic signs such as weakness numbness. No depression anxiety. IMM/ Allergy: No unusual infections.  Allergy .   REST of 12 system review negative except as per HPI   Past Medical History:  Diagnosis Date  . COLONIC POLYPS, HX OF 08/30/2007   Qualifier: Diagnosis of  By: Rogue Bussing CMA, Maryann Alar    . Hx of colonic polyps   . Hypertension   . Hypothyroidism   . OSTEOARTHRITIS, HAND 04/24/2009   Qualifier: Diagnosis of  By: Regis Bill MD, Standley Brooking   . PVC (premature ventricular contraction)    [facility] evaluation 7/03  . Seasonal allergies   . Skin cancer    hx of bcca of face  . SKIN CANCER, HX OF 02/02/2008   Qualifier: Diagnosis of  By: Regis Bill MD, Standley Brooking     Family History  Problem Relation Age of Onset  . Cancer Other        colon, prostate   . Diabetes Other   . Stroke Other   . Heart disease Other   . Colon cancer Mother     Social History   Socioeconomic History  . Marital status: Married    Spouse name: None  . Number of children: None  .  Years of education: None  . Highest education level: None  Social Needs  . Financial resource strain: None  . Food insecurity - worry: None  . Food insecurity - inability: None  . Transportation needs - medical: None  . Transportation needs - non-medical: None  Occupational History  . None  Tobacco Use  . Smoking status: Former Research scientist (life sciences)  . Smokeless tobacco: Never Used  Substance and Sexual Activity  . Alcohol use: Yes    Comment: rarely  . Drug use: No  . Sexual activity: None  Other Topics Concern  . None  Social History Narrative   Married    Former smoker   Set designer and teaches tap dancing.    No etoh   HH of 2     Outpatient Encounter Medications as of 12/23/2017  Medication Sig  . aspirin 81 MG tablet Take 81 mg by mouth daily.    [name] estradiol (ESTRACE) 0.1 MG/GM vaginal cream Place 2 g vaginally daily.    .  simvastatin (ZOCOR) 20 MG tablet TAKE 1 TABLET BY MOUTH AT BEDTIME  . SYNTHROID 100 MCG tablet TAKE 1 TABLET BY MOUTH EVERY DAY  . BIOTIN PO Take by mouth.  . Cholecalciferol (VITAMIN D3) 2000 units TABS Take 2,000 Units by mouth daily.  . Eluxadoline (VIBERZI) 75 MG TABS Take by mouth as needed.  . [DISCONTINUED] Eluxadoline (VIBERZI) 100 MG TABS Take 1 tablet by mouth daily.  . [DISCONTINUED] meloxicam (MOBIC) 15 MG tablet TK 1 T PO QD WF   No facility-administered encounter medications on file as of 12/23/2017.     EXAM:  BP 102/68 (BP Location: Right Arm, Patient Position: Sitting, Cuff Size: Normal)   Pulse (!) 107   Temp (!) 97.5 F (36.4 C) (Oral)   Ht '5\' 1"'  (1.549 m)   Wt 130 lb (59 kg) Comment: pt reports, did not want to weigh  BMI 24.56 kg/m   Body mass index is 24.56 kg/m.  Physical Exam: Vital signs reviewed HBZ:JIRC is a well-developed well-nourished alert cooperative   who appears  Younger than stated age in no acute distress.  HEENT: normocephalic atraumatic , Eyes: PERRL EOM's full, conjunctiva clear, Nares: paten,t no deformity discharge or tenderness., Ears: no deformity EAC's clear TMs with normal landmarks. Mouth: clear OP, no lesions, edema.  Moist mucous membranes. Dentition in adequate repair. NECK: supple without masses, thyromegaly or bruits. CHEST/PULM:  Clear to auscultation and percussion breath sounds equal no wheeze , rales or rhonchi. No chest wall deformities or tenderness.Breast: normal by inspection . No dimpling, discharge, masses, tenderness or discharge . CV: PMI is nondisplaced, S1 S2 no gallops, murmurs, rubs. Peripheral pulses are full without delay.No JVD .  ABDOMEN: Bowel sounds normal nontender  No guard or rebound, no hepato splenomegal no CVA tenderness.   Extremtities:  No clubbing cyanosis or edema, no acute joint swelling or redness no focal atrophy NEURO:  Oriented x3, cranial nerves 3-12 appear to be intact, no obvious focal  weakness,gait within normal limits no abnormal reflexes or asymmetrical SKIN: No acute rashes normal turgor, color, no bruising or petechiae. PSYCH: Oriented, good eye contact, no obvious depression anxiety, cognition and judgment appear normal. LN: no cervical axillary  adenopathy No noted deficits in memory, attention, and speech.   dexa in in jan  -1.9 grax 13.9 and 3.8   ASSESSMENT AND PLAN:  Discussed the following assessment and plan:  Visit for preventive health examination - Plan: Basic metabolic panel, CBC with Differential/Platelet,  Hepatic function panel, Lipid panel, TSH  Medication management - Plan: Basic metabolic panel, CBC with Differential/Platelet, Hepatic function panel, Lipid panel, TSH, POCT urinalysis dipstick  Hypothyroidism, unspecified type - Plan: Basic metabolic panel, CBC with Differential/Platelet, Hepatic function panel, Lipid panel, TSH, POCT urinalysis dipstick  HYPERLIPIDEMIA - Plan: Basic metabolic panel, CBC with Differential/Platelet, Hepatic function panel, Lipid panel, TSH, POCT urinalysis dipstick  Hx: UTI (urinary tract infection) - Plan: Basic metabolic panel, CBC with Differential/Platelet, Hepatic function panel, Lipid panel, TSH, POCT urinalysis dipstick Will refill meds if labs stable   90 days to [facility] requested  Counseled about shingrix vaccine   She had  Pre v and  Hx of shingles  Osteopenia borderline   Readings  Patient Care Team: Aunesty Tyson, Standley Brooking, MD as PCP - Sandford Craze, MD (Dermatology) Richmond Campbell, MD (Gastroenterology) Elsie Saas, MD (Orthopedic Surgery) Sherlynn Stalls, MD as Consulting Physician (Ophthalmology) [place], Heather, Spencer (Optometry)  Patient Instructions  Jim Desanctis  You are doing well.   Yearly exam and labs  Continue yearly flu vaccine .  New shingles vaccine when available  Wellness with Manuela Schwartz if  Wishes     Preventive Care 71 Years and Older, Female Preventive care refers to lifestyle  choices and visits with your health care provider that can promote health and wellness. What does preventive care include?  A yearly physical exam. This is also called an annual well check.  Dental exams once or twice a year.  Routine eye exams. Ask your health care provider how often you should have your eyes checked.  Personal lifestyle choices, including: ? Daily care of your teeth and gums. ? Regular physical activity. ? Eating a healthy diet. ? Avoiding tobacco and drug use. ? Limiting alcohol use. ? Practicing safe sex. ? Taking low-dose aspirin every day. ? Taking vitamin and mineral supplements as recommended by your health care provider. What happens during an annual well check? The services and screenings done by your health care provider during your annual well check will depend on your age, overall health, lifestyle risk factors, and family history of disease. Counseling Your health care provider may ask you questions about your:  Alcohol use.  Tobacco use.  Drug use.  Emotional well-being.  Home and relationship well-being.  Sexual activity.  Eating habits.  History of falls.  Memory and ability to understand (cognition).  Work and work Statistician.  Reproductive health.  Screening You may have the following tests or measurements:  Height, weight, and BMI.  Blood pressure.  Lipid and cholesterol levels. These may be checked every 5 years, or more frequently if you are over 51 years old.  Skin check.  Lung cancer screening. You may have this screening every year starting at age 45 if you have a 30-pack-year history of smoking and currently smoke or have quit within the past 15 years.  Fecal occult blood test (FOBT) of the stool. You may have this test every year starting at age 52.  Flexible sigmoidoscopy or colonoscopy. You may have a sigmoidoscopy every 5 years or a colonoscopy every 10 years starting at age 59.  Hepatitis C blood  test.  Hepatitis B blood test.  Sexually transmitted disease (STD) testing.  Diabetes screening. This is done by checking your blood sugar (glucose) after you have not eaten for a while (fasting). You may have this done every 1-3 years.  Bone density scan. This is done to screen for osteoporosis. You may have this done starting at age  65.  Mammogram. This may be done every 1-2 years. Talk to your health care provider about how often you should have regular mammograms.  Talk with your health care provider about your test results, treatment options, and if necessary, the need for more tests. Vaccines Your health care provider may recommend certain vaccines, such as:  Influenza vaccine. This is recommended every year.  Tetanus, diphtheria, and acellular pertussis (Tdap, Td) vaccine. You may need a Td booster every 10 years.  Varicella vaccine. You may need this if you have not been vaccinated.  Zoster vaccine. You may need this after age 79.  Measles, mumps, and rubella (MMR) vaccine. You may need at least one dose of MMR if you were born in 1957 or later. You may also need a second dose.  Pneumococcal 13-valent conjugate (PCV13) vaccine. One dose is recommended after age 59.  Pneumococcal polysaccharide (PPSV23) vaccine. One dose is recommended after age 67.  Meningococcal vaccine. You may need this if you have certain conditions.  Hepatitis A vaccine. You may need this if you have certain conditions or if you travel or work in places where you may be exposed to hepatitis A.  Hepatitis B vaccine. You may need this if you have certain conditions or if you travel or work in places where you may be exposed to hepatitis B.  Haemophilus influenzae type b (Hib) vaccine. You may need this if you have certain conditions.  Talk to your health care provider about which screenings and vaccines you need and how often you need them. This information is not intended to replace advice given to  you by your health care provider. Make sure you discuss any questions you have with your health care provider. Document Released: 01/04/2016 Document Revised: 08/27/2016 Document Reviewed: 10/09/2015 Elsevier Interactive Patient Education  2018 [employer]. Aishwarya Shiplett M.D.

## 2017-12-23 ENCOUNTER — Encounter: Payer: Self-pay | Admitting: Internal Medicine

## 2017-12-23 ENCOUNTER — Ambulatory Visit (INDEPENDENT_AMBULATORY_CARE_PROVIDER_SITE_OTHER): Payer: Medicare Other | Admitting: Internal Medicine

## 2017-12-23 VITALS — BP 102/68 | HR 107 | Temp 97.5°F | Ht 61.0 in | Wt 130.0 lb

## 2017-12-23 DIAGNOSIS — E039 Hypothyroidism, unspecified: Secondary | ICD-10-CM

## 2017-12-23 DIAGNOSIS — Z79899 Other long term (current) drug therapy: Secondary | ICD-10-CM

## 2017-12-23 DIAGNOSIS — E782 Mixed hyperlipidemia: Secondary | ICD-10-CM

## 2017-12-23 DIAGNOSIS — Z0000 Encounter for general adult medical examination without abnormal findings: Secondary | ICD-10-CM | POA: Diagnosis not present

## 2017-12-23 DIAGNOSIS — Z87440 Personal history of urinary (tract) infections: Secondary | ICD-10-CM

## 2017-12-23 LAB — BASIC METABOLIC PANEL
BUN: 17 mg/dL (ref 6–23)
CALCIUM: 8.9 mg/dL (ref 8.4–10.5)
CO2: 29 meq/L (ref 19–32)
Chloride: 104 mEq/L (ref 96–112)
Creatinine, Ser: 0.83 mg/dL (ref 0.40–1.20)
GFR: 70.56 mL/min (ref >60.00)
Glucose, Bld: 93 mg/dL (ref 70–99)
POTASSIUM: 4.2 meq/L (ref 3.5–5.1)
SODIUM: 142 meq/L (ref 135–145)

## 2017-12-23 LAB — POCT URINALYSIS DIP (MANUAL ENTRY)
BILIRUBIN UA: NEGATIVE
BILIRUBIN UA: NEGATIVE mg/dL
Glucose, UA: NEGATIVE mg/dL
Nitrite, UA: POSITIVE — AB
PROTEIN UA: NEGATIVE mg/dL
Spec Grav, UA: 1.025 (ref 1.010–1.025)
Urobilinogen, UA: 0.2 E.U./dL
pH, UA: 6 (ref 5.0–8.0)

## 2017-12-23 LAB — HEPATIC FUNCTION PANEL
ALBUMIN: 4.4 g/dL (ref 3.5–5.2)
ALK PHOS: 115 U/L (ref 39–117)
ALT: 19 U/L (ref 0–35)
AST: 16 U/L (ref 0–37)
Bilirubin, Direct: 0.1 mg/dL (ref 0.0–0.3)
Total Bilirubin: 0.4 mg/dL (ref 0.2–1.2)
Total Protein: 6.7 g/dL (ref 6.0–8.3)

## 2017-12-23 LAB — CBC WITH DIFFERENTIAL/PLATELET
BASOS ABS: 0 10*3/uL (ref 0.0–0.1)
Basophils Relative: 0.5 % (ref 0.0–3.0)
EOS PCT: 2.7 % (ref 0.0–5.0)
Eosinophils Absolute: 0.2 10*3/uL (ref 0.0–0.7)
HEMATOCRIT: 43.8 % (ref 36.0–46.0)
Hemoglobin: 14.5 g/dL (ref 12.0–15.0)
LYMPHS PCT: 36 % (ref 12.0–46.0)
Lymphs Abs: 2.5 10*3/uL (ref 0.7–4.0)
MCHC: 33 g/dL (ref 30.0–36.0)
MCV: 94.6 fl (ref 78.0–100.0)
Monocytes Absolute: 0.6 10*3/uL (ref 0.1–1.0)
Monocytes Relative: 8.6 % (ref 3.0–12.0)
NEUTROS ABS: 3.7 10*3/uL (ref 1.4–7.7)
Neutrophils Relative %: 52.2 % (ref 43.0–77.0)
PLATELETS: 291 10*3/uL (ref 150.0–400.0)
RBC: 4.63 Mil/uL (ref 3.87–5.11)
RDW: 13 % (ref 11.5–15.5)
WBC: 7 10*3/uL (ref 4.0–10.5)

## 2017-12-23 LAB — LIPID PANEL
CHOLESTEROL: 182 mg/dL (ref 0–200)
HDL: 44.4 mg/dL (ref >39.00)
NonHDL: 137.5
Total CHOL/HDL Ratio: 4
Triglycerides: 245 mg/dL — ABNORMAL HIGH (ref 0.0–149.0)
VLDL: 49 mg/dL — ABNORMAL HIGH (ref 0.0–40.0)

## 2017-12-23 LAB — LDL CHOLESTEROL, DIRECT: Direct LDL: 107 mg/dL

## 2017-12-23 LAB — TSH: TSH: 0.94 u[IU]/mL (ref 0.35–4.50)

## 2017-12-23 NOTE — Addendum Note (Signed)
Addended by: Virl Cagey on: 12/23/2017 01:59 PM   Modules accepted: Orders

## 2017-12-23 NOTE — Addendum Note (Signed)
Addended by: Elmer Picker on: 12/23/2017 02:04 PM   Modules accepted: Orders

## 2017-12-23 NOTE — Patient Instructions (Addendum)
Glad  You are doing well.   Yearly exam and labs  Continue yearly flu vaccine .  New shingles vaccine when available  Wellness with Manuela Schwartz if  Wishes     Preventive Care 49 Years and Older, Female Preventive care refers to lifestyle choices and visits with your health care provider that can promote health and wellness. What does preventive care include?  A yearly physical exam. This is also called an annual well check.  Dental exams once or twice a year.  Routine eye exams. Ask your health care provider how often you should have your eyes checked.  Personal lifestyle choices, including: ? Daily care of your teeth and gums. ? Regular physical activity. ? Eating a healthy diet. ? Avoiding tobacco and drug use. ? Limiting alcohol use. ? Practicing safe sex. ? Taking low-dose aspirin every day. ? Taking vitamin and mineral supplements as recommended by your health care provider. What happens during an annual well check? The services and screenings done by your health care provider during your annual well check will depend on your age, overall health, lifestyle risk factors, and family history of disease. Counseling Your health care provider may ask you questions about your:  Alcohol use.  Tobacco use.  Drug use.  Emotional well-being.  Home and relationship well-being.  Sexual activity.  Eating habits.  History of falls.  Memory and ability to understand (cognition).  Work and work Statistician.  Reproductive health.  Screening You may have the following tests or measurements:  Height, weight, and BMI.  Blood pressure.  Lipid and cholesterol levels. These may be checked every 5 years, or more frequently if you are over 96 years old.  Skin check.  Lung cancer screening. You may have this screening every year starting at age 35 if you have a 30-pack-year history of smoking and currently smoke or have quit within the past 15 years.  Fecal occult blood test  (FOBT) of the stool. You may have this test every year starting at age 40.  Flexible sigmoidoscopy or colonoscopy. You may have a sigmoidoscopy every 5 years or a colonoscopy every 10 years starting at age 92.  Hepatitis C blood test.  Hepatitis B blood test.  Sexually transmitted disease (STD) testing.  Diabetes screening. This is done by checking your blood sugar (glucose) after you have not eaten for a while (fasting). You may have this done every 1-3 years.  Bone density scan. This is done to screen for osteoporosis. You may have this done starting at age 53.  Mammogram. This may be done every 1-2 years. Talk to your health care provider about how often you should have regular mammograms.  Talk with your health care provider about your test results, treatment options, and if necessary, the need for more tests. Vaccines Your health care provider may recommend certain vaccines, such as:  Influenza vaccine. This is recommended every year.  Tetanus, diphtheria, and acellular pertussis (Tdap, Td) vaccine. You may need a Td booster every 10 years.  Varicella vaccine. You may need this if you have not been vaccinated.  Zoster vaccine. You may need this after age 28.  Measles, mumps, and rubella (MMR) vaccine. You may need at least one dose of MMR if you were born in 1957 or later. You may also need a second dose.  Pneumococcal 13-valent conjugate (PCV13) vaccine. One dose is recommended after age 52.  Pneumococcal polysaccharide (PPSV23) vaccine. One dose is recommended after age 66.  Meningococcal vaccine. You may  need this if you have certain conditions.  Hepatitis A vaccine. You may need this if you have certain conditions or if you travel or work in places where you may be exposed to hepatitis A.  Hepatitis B vaccine. You may need this if you have certain conditions or if you travel or work in places where you may be exposed to hepatitis B.  Haemophilus influenzae type b  (Hib) vaccine. You may need this if you have certain conditions.  Talk to your health care provider about which screenings and vaccines you need and how often you need them. This information is not intended to replace advice given to you by your health care provider. Make sure you discuss any questions you have with your health care provider. Document Released: 01/04/2016 Document Revised: 08/27/2016 Document Reviewed: 10/09/2015 Elsevier Interactive Patient Education  [employer].

## 2017-12-25 ENCOUNTER — Other Ambulatory Visit: Payer: Self-pay

## 2017-12-25 LAB — URINE CULTURE
MICRO NUMBER: 90003395
SPECIMEN QUALITY:: ADEQUATE

## 2017-12-25 MED ORDER — SIMVASTATIN 20 MG PO TABS: 20.0000 mg | ORAL_TABLET | Freq: Every day | ORAL | 3 refills | Status: DC

## 2017-12-25 MED ORDER — SYNTHROID 100 MCG PO TABS: 100.0000 ug | ORAL_TABLET | Freq: Every day | ORAL | 3 refills | Status: DC

## 2017-12-29 ENCOUNTER — Other Ambulatory Visit: Payer: Self-pay | Admitting: *Deleted

## 2017-12-29 DIAGNOSIS — H353112 Nonexudative age-related macular degeneration, right eye, intermediate dry stage: Secondary | ICD-10-CM | POA: Diagnosis not present

## 2017-12-29 DIAGNOSIS — H43813 Vitreous degeneration, bilateral: Secondary | ICD-10-CM | POA: Diagnosis not present

## 2017-12-29 DIAGNOSIS — H35423 Microcystoid degeneration of retina, bilateral: Secondary | ICD-10-CM | POA: Diagnosis not present

## 2017-12-29 DIAGNOSIS — H353221 Exudative age-related macular degeneration, left eye, with active choroidal neovascularization: Secondary | ICD-10-CM | POA: Diagnosis not present

## 2017-12-29 MED ORDER — SULFAMETHOXAZOLE-TRIMETHOPRIM 800-160 MG PO TABS: 1.0000 | ORAL_TABLET | Freq: Two times a day (BID) | ORAL | 0 refills | Status: DC

## 2018-02-09 DIAGNOSIS — H43813 Vitreous degeneration, bilateral: Secondary | ICD-10-CM | POA: Diagnosis not present

## 2018-02-09 DIAGNOSIS — H353221 Exudative age-related macular degeneration, left eye, with active choroidal neovascularization: Secondary | ICD-10-CM | POA: Diagnosis not present

## 2018-02-09 DIAGNOSIS — H353112 Nonexudative age-related macular degeneration, right eye, intermediate dry stage: Secondary | ICD-10-CM | POA: Diagnosis not present

## 2018-02-09 DIAGNOSIS — H43821 Vitreomacular adhesion, right eye: Secondary | ICD-10-CM | POA: Diagnosis not present

## 2018-02-20 DIAGNOSIS — M25572 Pain in left ankle and joints of left foot: Secondary | ICD-10-CM | POA: Diagnosis not present

## 2018-02-21 ENCOUNTER — Other Ambulatory Visit: Payer: Self-pay | Admitting: Internal Medicine

## 2018-03-04 ENCOUNTER — Ambulatory Visit (INDEPENDENT_AMBULATORY_CARE_PROVIDER_SITE_OTHER): Payer: Medicare Other | Admitting: Podiatry

## 2018-03-04 ENCOUNTER — Encounter: Payer: Self-pay | Admitting: Podiatry

## 2018-03-04 ENCOUNTER — Ambulatory Visit (INDEPENDENT_AMBULATORY_CARE_PROVIDER_SITE_OTHER): Payer: Medicare Other

## 2018-03-04 VITALS — BP 117/65 | HR 62 | Resp 16

## 2018-03-04 DIAGNOSIS — G5762 Lesion of plantar nerve, left lower limb: Secondary | ICD-10-CM | POA: Diagnosis not present

## 2018-03-04 DIAGNOSIS — G5782 Other specified mononeuropathies of left lower limb: Secondary | ICD-10-CM

## 2018-03-04 DIAGNOSIS — M1990 Unspecified osteoarthritis, unspecified site: Secondary | ICD-10-CM | POA: Insufficient documentation

## 2018-03-04 DIAGNOSIS — N952 Postmenopausal atrophic vaginitis: Secondary | ICD-10-CM | POA: Insufficient documentation

## 2018-03-04 DIAGNOSIS — N950 Postmenopausal bleeding: Secondary | ICD-10-CM | POA: Insufficient documentation

## 2018-03-04 DIAGNOSIS — N959 Unspecified menopausal and perimenopausal disorder: Secondary | ICD-10-CM | POA: Insufficient documentation

## 2018-03-06 NOTE — Progress Notes (Signed)
Subjective:  Patient ID: Christina Myers, female    DOB: 17-Mar-1941,  MRN: [id number] HPI Chief Complaint  Patient presents with  . Foot Pain    Forefoot/3rd and 4th toes (3rd interspace) left - aching x 1 month, initially was severe, saw her ortho-xrayed, said was neuroma, Rx'd voltaren, has improved    77 y.o. female presents with the above complaint.   ROS: Denies fever chills nausea vomiting muscle aches pains chest pain shortness of breath.  Past Medical History:  Diagnosis Date  . COLONIC POLYPS, HX OF 08/30/2007   Qualifier: Diagnosis of  By: Rogue Bussing CMA, Maryann Alar    . Hx of colonic polyps   . Hypertension   . Hypothyroidism   . OSTEOARTHRITIS, HAND 04/24/2009   Qualifier: Diagnosis of  By: Regis Bill MD, Standley Brooking   . PVC (premature ventricular contraction)    [facility] evaluation 7/03  . Seasonal allergies   . Skin cancer    hx of bcca of face  . SKIN CANCER, HX OF 02/02/2008   Qualifier: Diagnosis of  By: Regis Bill MD, Standley Brooking    Past Surgical History:  Procedure Laterality Date  . FACIAL COSMETIC SURGERY    . TONSILLECTOMY    . tubal lig      Current Outpatient Medications:  .  aspirin 81 MG tablet, Take 81 mg by mouth daily.  , Disp: , Rfl:  .  BIOTIN PO, Take by mouth., Disp: , Rfl:  .  Cholecalciferol (VITAMIN D3) 2000 units TABS, Take 2,000 Units by mouth daily., Disp: , Rfl:  .  Eluxadoline (VIBERZI) 75 MG TABS, Take by mouth as needed., Disp: , Rfl:  .  estradiol (ESTRACE) 0.1 MG/GM vaginal cream, Place 2 g vaginally daily.  , Disp: , Rfl:  .  simvastatin (ZOCOR) 20 MG tablet, Take 1 tablet (20 mg total) by mouth at bedtime., Disp: 90 tablet, Rfl: 3 .  SYNTHROID 100 MCG tablet, TAKE 1 TABLET BY MOUTH EVERY DAY, Disp: 90 tablet, Rfl: 0 .  tobramycin (TOBREX) 0.3 % ophthalmic solution, , Disp: , Rfl: 4  Allergies  Allergen Reactions  . Amoxicillin-Pot Clavulanate     REACTION: colitis  after ceclor  adn augmentin years ago  . Cefdinir     REACTION: "colitis"  diarrhea   Review of Systems Objective:   Vitals:   03/04/18 1604  BP: 117/65  Pulse: 62  Resp: 16    General: Well developed, nourished, in no acute distress, alert and oriented x3   Dermatological: Skin is warm, dry and supple bilateral. Nails x 10 are well maintained; remaining integument appears unremarkable at this time. There are no open sores, no preulcerative lesions, no rash or signs of infection present.  Vascular: Dorsalis Pedis artery and Posterior Tibial artery pedal pulses are 2/4 bilateral with immedate capillary fill time. Pedal hair growth present. No varicosities and no lower extremity edema present bilateral.   Neruologic: Grossly intact via light touch bilateral. Vibratory intact via tuning fork bilateral. Protective threshold with Semmes Wienstein monofilament intact to all pedal sites bilateral. Patellar and Achilles deep tendon reflexes 2+ bilateral. No Babinski or clonus noted bilateral.  Mild tenderness on palpation third interdigital space positive Mulder's click but only mildly tender.  Musculoskeletal: No gross boney pedal deformities bilateral. No pain, crepitus, or limitation noted with foot and ankle range of motion bilateral. Muscular strength 5/5 in all groups tested bilateral.  No pain on range of motion of the third and fourth metatarsophalangeal joints.  Gait:  Unassisted, Nonantalgic.    Radiographs:  Radiographs were reviewed from orthopedic.  Demonstrates no acute findings.  No fractures identified.  Assessment & Plan:   Assessment: Possible neuroma third interdigital space left foot.  Plan: Call back or return to clinic if symptoms worsen.  Continue the use of Voltaren gel.     Max T. Tavernier, [place]

## 2018-03-23 ENCOUNTER — Other Ambulatory Visit: Payer: Self-pay | Admitting: Internal Medicine

## 2018-03-23 DIAGNOSIS — Z1231 Encounter for screening mammogram for malignant neoplasm of breast: Secondary | ICD-10-CM

## 2018-03-30 DIAGNOSIS — H353112 Nonexudative age-related macular degeneration, right eye, intermediate dry stage: Secondary | ICD-10-CM | POA: Diagnosis not present

## 2018-03-30 DIAGNOSIS — H43821 Vitreomacular adhesion, right eye: Secondary | ICD-10-CM | POA: Diagnosis not present

## 2018-03-30 DIAGNOSIS — H353221 Exudative age-related macular degeneration, left eye, with active choroidal neovascularization: Secondary | ICD-10-CM | POA: Diagnosis not present

## 2018-03-30 DIAGNOSIS — H35423 Microcystoid degeneration of retina, bilateral: Secondary | ICD-10-CM | POA: Diagnosis not present

## 2018-04-19 ENCOUNTER — Telehealth: Payer: Self-pay | Admitting: Podiatry

## 2018-04-19 ENCOUNTER — Ambulatory Visit
Admission: RE | Admit: 2018-04-19 | Discharge: 2018-04-19 | Disposition: A | Discharge status: Home / Self Care | Payer: Medicare Other | Source: Ambulatory Visit | Attending: Internal Medicine | Admitting: Internal Medicine

## 2018-04-19 DIAGNOSIS — Z1231 Encounter for screening mammogram for malignant neoplasm of breast: Secondary | ICD-10-CM

## 2018-04-19 NOTE — Telephone Encounter (Signed)
Left message instructing pt to wear a wide, stiff bottom shoe to decrease pressure from side to side of the structures of the foot, and decrease bending of the ball of the foot, ice 3-4 times a day for 15-20 minutes protecting the skin from the ice with a light cloth and if tolerated take OTC antiinflammatories as the package instructs, and to call with concerns.

## 2018-04-19 NOTE — Telephone Encounter (Signed)
Patients neuroma came back and she's in a lot of pain Dr Milinda Pointer doesn't have a est patient spot until May 14th she wants to talk to the nurse to see if theres something that can help in the meantime. If you can call her back at [phone]

## 2018-04-27 DIAGNOSIS — H353112 Nonexudative age-related macular degeneration, right eye, intermediate dry stage: Secondary | ICD-10-CM | POA: Diagnosis not present

## 2018-04-27 DIAGNOSIS — H353221 Exudative age-related macular degeneration, left eye, with active choroidal neovascularization: Secondary | ICD-10-CM | POA: Diagnosis not present

## 2018-04-27 DIAGNOSIS — H43821 Vitreomacular adhesion, right eye: Secondary | ICD-10-CM | POA: Diagnosis not present

## 2018-04-27 DIAGNOSIS — H43393 Other vitreous opacities, bilateral: Secondary | ICD-10-CM | POA: Diagnosis not present

## 2018-05-04 ENCOUNTER — Ambulatory Visit: Payer: Medicare Other | Admitting: Podiatry

## 2018-05-04 ENCOUNTER — Encounter

## 2018-05-07 DIAGNOSIS — K580 Irritable bowel syndrome with diarrhea: Secondary | ICD-10-CM | POA: Diagnosis not present

## 2018-05-26 DIAGNOSIS — H43821 Vitreomacular adhesion, right eye: Secondary | ICD-10-CM | POA: Diagnosis not present

## 2018-05-26 DIAGNOSIS — H353112 Nonexudative age-related macular degeneration, right eye, intermediate dry stage: Secondary | ICD-10-CM | POA: Diagnosis not present

## 2018-05-26 DIAGNOSIS — H353221 Exudative age-related macular degeneration, left eye, with active choroidal neovascularization: Secondary | ICD-10-CM | POA: Diagnosis not present

## 2018-05-26 DIAGNOSIS — H35423 Microcystoid degeneration of retina, bilateral: Secondary | ICD-10-CM | POA: Diagnosis not present

## 2018-05-28 ENCOUNTER — Other Ambulatory Visit: Payer: Self-pay | Admitting: Internal Medicine

## 2018-05-28 NOTE — Telephone Encounter (Signed)
Prior auth for Synthroid 151mcg sent to [email or web address].

## 2018-05-28 NOTE — Telephone Encounter (Addendum)
Copied from [place] [id number]. Topic: Quick Communication - Rx Refill/Question >> May 28, 2018 12:05 PM Scherrie Gerlach wrote: Medication: SYNTHROID 100 MCG tablet Pt needs 90 day supply, and would like enough to get through to her yearly in Jan 2020  Pt states this needs a prior auth and [facility] has sent in a form and the pharmacy has too.  Pt needs name brand.  [facility] [place], [address] AT [address] [phone] (Phone) [phone] (Fax)

## 2018-06-04 DIAGNOSIS — H1032 Unspecified acute conjunctivitis, left eye: Secondary | ICD-10-CM | POA: Diagnosis not present

## 2018-06-08 NOTE — Telephone Encounter (Signed)
Approvedon June 7 Effective from 05/28/2018 through 05/29/2019.

## 2018-06-17 DIAGNOSIS — L821 Other seborrheic keratosis: Secondary | ICD-10-CM | POA: Diagnosis not present

## 2018-06-17 DIAGNOSIS — Z85828 Personal history of other malignant neoplasm of skin: Secondary | ICD-10-CM | POA: Diagnosis not present

## 2018-06-29 ENCOUNTER — Encounter: Payer: Self-pay | Admitting: Podiatry

## 2018-06-29 ENCOUNTER — Ambulatory Visit (INDEPENDENT_AMBULATORY_CARE_PROVIDER_SITE_OTHER): Payer: Medicare Other | Admitting: Podiatry

## 2018-06-29 DIAGNOSIS — D3610 Benign neoplasm of peripheral nerves and autonomic nervous system, unspecified: Secondary | ICD-10-CM

## 2018-06-29 DIAGNOSIS — D3613 Benign neoplasm of peripheral nerves and autonomic nervous system of lower limb, including hip: Secondary | ICD-10-CM

## 2018-06-30 NOTE — Progress Notes (Signed)
Subjective:   Patient ID: Christina Myers, female   DOB: 77 y.o.   MRN: [id number]   HPI Patient states that the pain is mostly at night and at times it burns between the third and fourth toes and it seems the Voltaren gel and Aspercreme gives her slight relief.  Is been going on for a number of months and the pain during the day is minimal   ROS      Objective:  Physical Exam  Neurovascular status intact with patient found to have mild discomfort third interspace left with what appears to be a palpable mass consistent with probable nerve impingement     Assessment:  Neuroma probability left with possibility for inflammatory condition or other pathology     Plan:  Explained condition and reviewed and at this point we will do injection and I did explain that it may not solve the problem and ultimately may require other treatment plan.  I did a sterile prep of the left forefoot I then injected the nerve directly with a purified alcohol Marcaine solution and patient will be seen back again to reevaluate in the next several weeks

## 2018-07-14 ENCOUNTER — Encounter: Payer: Self-pay | Admitting: Podiatry

## 2018-07-14 ENCOUNTER — Ambulatory Visit (INDEPENDENT_AMBULATORY_CARE_PROVIDER_SITE_OTHER): Payer: Medicare Other | Admitting: Podiatry

## 2018-07-14 DIAGNOSIS — D3613 Benign neoplasm of peripheral nerves and autonomic nervous system of lower limb, including hip: Secondary | ICD-10-CM

## 2018-07-14 DIAGNOSIS — D3610 Benign neoplasm of peripheral nerves and autonomic nervous system, unspecified: Secondary | ICD-10-CM

## 2018-07-15 NOTE — Progress Notes (Signed)
Subjective:   Patient ID: Christina Myers, female   DOB: 77 y.o.   MRN: [id number]   HPI Patient presents stating that she had one night of complete relief followed by recurrence of the pain in her left foot but it seems to be slightly improved from previous visit neurovascular status intact   ROS      Objective:  Physical Exam  Neurovascular status intact with patient noted to have continued irritation third interspace left foot     Assessment:  Probability for neuroma symptomatology present left     Plan:  Sterile prep of the left foot accomplished and I did inject the third interspace with a purified alcohol Marcaine solution to try to reduce the inflammation and pain associated with neuroma with 1.5 cc.  Reappoint in 3 weeks and understands ultimate resection may be necessary

## 2018-07-21 DIAGNOSIS — H35423 Microcystoid degeneration of retina, bilateral: Secondary | ICD-10-CM | POA: Diagnosis not present

## 2018-07-21 DIAGNOSIS — H353221 Exudative age-related macular degeneration, left eye, with active choroidal neovascularization: Secondary | ICD-10-CM | POA: Diagnosis not present

## 2018-07-21 DIAGNOSIS — H353112 Nonexudative age-related macular degeneration, right eye, intermediate dry stage: Secondary | ICD-10-CM | POA: Diagnosis not present

## 2018-07-21 DIAGNOSIS — H43821 Vitreomacular adhesion, right eye: Secondary | ICD-10-CM | POA: Diagnosis not present

## 2018-08-05 ENCOUNTER — Ambulatory Visit (INDEPENDENT_AMBULATORY_CARE_PROVIDER_SITE_OTHER): Payer: Medicare Other | Admitting: Podiatry

## 2018-08-05 ENCOUNTER — Encounter: Payer: Self-pay | Admitting: Podiatry

## 2018-08-05 DIAGNOSIS — G5762 Lesion of plantar nerve, left lower limb: Secondary | ICD-10-CM

## 2018-08-05 DIAGNOSIS — D3610 Benign neoplasm of peripheral nerves and autonomic nervous system, unspecified: Secondary | ICD-10-CM

## 2018-08-06 NOTE — Progress Notes (Signed)
Subjective:   Patient ID: Christina Myers, female   DOB: 77 y.o.   MRN: [id number]   HPI Patient states she feels like she is improving with diminished discomfort   ROS      Objective:  Physical Exam  Neurovascular status intact with shooting pains third interspace left that is improving with patient slated to go to [place] in [place] the 12th of next month     Assessment:  Neuroma symptomatology improving but present     Plan:  Recommended continuation of conservative treatment as it appears she continues to improve and I did a sterile prep of the left foot and I then injected directly into the nerve root with a combination of purified alcohol Marcaine solution which was tolerated well reappoint to recheck

## 2018-08-13 ENCOUNTER — Encounter: Payer: Self-pay | Admitting: Internal Medicine

## 2018-08-13 ENCOUNTER — Ambulatory Visit (INDEPENDENT_AMBULATORY_CARE_PROVIDER_SITE_OTHER): Payer: Medicare Other

## 2018-08-13 ENCOUNTER — Ambulatory Visit: Payer: Medicare Other | Admitting: Internal Medicine

## 2018-08-13 VITALS — BP 100/62 | HR 64 | Temp 98.0°F | Wt 126.0 lb

## 2018-08-13 DIAGNOSIS — Z87891 Personal history of nicotine dependence: Secondary | ICD-10-CM

## 2018-08-13 DIAGNOSIS — R05 Cough: Secondary | ICD-10-CM

## 2018-08-13 DIAGNOSIS — R053 Chronic cough: Secondary | ICD-10-CM

## 2018-08-13 DIAGNOSIS — J22 Unspecified acute lower respiratory infection: Secondary | ICD-10-CM | POA: Diagnosis not present

## 2018-08-13 MED ORDER — AZITHROMYCIN 250 MG PO TABS: ORAL_TABLET | ORAL | 0 refills | Status: DC

## 2018-08-13 NOTE — Progress Notes (Signed)
Chief Complaint  Patient presents with  . Cough    Cough and congestion x 12 days. Pt states that her symptoms improved some in the first few days then worsened again. Pt c/o hoarseness, sneezing, runny nose, little sore throat in beginning and low grade temp with sweating. Pt has been using OTC Cold and Flu medications for sx's.     HPI: Christina Myers 77 y.o. come in for sda   Began  Feeling off   August 11- 12  Began getting sick and  Better after 3 days or so .  Began with sneezing fits   More thana week ago and then went to bed after returning from travel in state  And nose began  runnig again.    Lots of sneezing .      Chest  Loose cough  .  ocass phegm   Nasal drainage is clear    No blood .  Tiny pressure right  Sinus.   No sob    other had a coughing illness   husband has had a cough getting better?  Friend had lung cancer wants to address that  .  Ex tobacco over 15 year sago  No hx of cough or copd  Taking otc meds for comfort  ROS: See pertinent positives and negatives per HPI. No fever chills some better than yesterday  Past Medical History:  Diagnosis Date  . COLONIC POLYPS, HX OF 08/30/2007   Qualifier: Diagnosis of  By: Rogue Bussing CMA, Maryann Alar    . Hx of colonic polyps   . Hypertension   . Hypothyroidism   . OSTEOARTHRITIS, HAND 04/24/2009   Qualifier: Diagnosis of  By: Regis Bill MD, Standley Brooking   . PVC (premature ventricular contraction)    [facility] evaluation 7/03  . Seasonal allergies   . Skin cancer    hx of bcca of face  . SKIN CANCER, HX OF 02/02/2008   Qualifier: Diagnosis of  By: Regis Bill MD, Standley Brooking     Family History  Problem Relation Age of Onset  . Cancer Other        colon, prostate   . Diabetes Other   . Stroke Other   . Heart disease Other   . Colon cancer Mother     Social History   Socioeconomic History  . Marital status: Married    Spouse name: Not on file  . Number of children: Not on file  . Years of education: Not on file  . Highest  education level: Not on file  Occupational History  . Not on file  Social Needs  . Financial resource strain: Not on file  . Food insecurity:    Worry: Not on file    Inability: Not on file  . Transportation needs:    Medical: Not on file    Non-medical: Not on file  Tobacco Use  . Smoking status: Former Research scientist (life sciences)  . Smokeless tobacco: Never Used  Substance and Sexual Activity  . Alcohol use: Yes    Comment: rarely  . Drug use: No  . Sexual activity: Not on file  Lifestyle  . Physical activity:    Days per week: Not on file    Minutes per session: Not on file  . Stress: Not on file  Relationships  . Social connections:    Talks on phone: Not on file    Gets together: Not on file    Attends religious service: Not on file    Active member  of club or organization: Not on file    Attends meetings of clubs or organizations: Not on file    Relationship status: Not on file  Other Topics Concern  . Not on file  Social History Narrative   Married    Former smoker   Set designer and teaches tap dancing.    No etoh   HH of 2     Outpatient Medications Prior to Visit  Medication Sig Dispense Refill  . aspirin 81 MG tablet Take 81 mg by mouth daily.      [name] BIOTIN PO Take by mouth.    . Cholecalciferol (VITAMIN D3) 2000 units TABS Take 2,000 Units by mouth daily.    . Eluxadoline (VIBERZI) 75 MG TABS Take by mouth as needed.    [name] estradiol (ESTRACE) 0.1 MG/GM vaginal cream Place 2 g vaginally daily.      . simvastatin (ZOCOR) 20 MG tablet Take 1 tablet (20 mg total) by mouth at bedtime. 90 tablet 3  . SYNTHROID 100 MCG tablet TAKE 1 TABLET BY MOUTH EVERY DAY 90 tablet 0  . tobramycin (TOBREX) 0.3 % ophthalmic solution   4   No facility-administered medications prior to visit.      EXAM:  BP 100/62 (BP Location: Right Arm, Patient Position: Sitting, Cuff Size: Normal)   Pulse 64   Temp 98 F (36.7 C) (Oral)   Wt 126 lb (57.2 kg)   SpO2 98%   BMI 23.81 kg/m   Body  mass index is 23.81 kg/m. WDWN in NAD  quiet respirations; mildly congested  Nl speech no respiration  Non toxic . HEENT: Normocephalic ;atraumatic , Eyes;  PERRL, EOMs  Full, lids and conjunctiva clear,,Ears: no deformities, canals nl, TM landmarks normal, Nose: no deformity mucoid  discharge but congested;face minimally tender Mouth : OP clear without lesion or edema . Neck: Supple without adenopathy or masses or bruits Chest:  Clear to A&P without wheezes rales or rhonchi CV:  S1-S2 no gallops or murmurs peripheral perfusion is normal Skin :nl perfusion and no acute rashes    Lab Results  Component Value Date   WBC 7.0 12/23/2017   HGB 14.5 12/23/2017   HCT 43.8 12/23/2017   PLT 291.0 12/23/2017   GLUCOSE 93 12/23/2017   CHOL 182 12/23/2017   TRIG 245.0 (H) 12/23/2017   HDL 44.40 12/23/2017   LDLDIRECT 107.0 12/23/2017   LDLCALC 83 11/19/2016   ALT 19 12/23/2017   AST 16 12/23/2017   NA 142 12/23/2017   K 4.2 12/23/2017   CL 104 12/23/2017   CREATININE 0.83 12/23/2017   BUN 17 12/23/2017   CO2 29 12/23/2017   TSH 0.94 12/23/2017   INR 1.0 ratio 11/27/2010   BP Readings from Last 3 Encounters:  08/13/18 100/62  03/04/18 117/65  12/23/17 102/68    ASSESSMENT AND PLAN:  Discussed the following assessment and plan:  Acute respiratory infection ? pneumonitis  - x ray nl except poss pneumonitis  see text   Cough, persistent - Plan: DG Chest 2 View, DG Chest 2 View  Ex-smoker This is still most likely of viral respiratory infection without complication because of the question of pneumonitis and persistence of symptoms and perhaps relapsing symptoms advice directions if not continuing to improve over the next 48 hours add antibiotic macrolide.  For empiric treatment.  For atypicals etc. She looks quite well today without alarm features. Total visit 78mins > 50% spent counseling and coordinating care as indicated in above  note and in instructions to patient .   -Patient  advised to return or notify health care team  if  new concerns arise.  Patient Instructions  Your exam is reassuring    And chest lung exam is clear .   Chest x ray  Shows no  Pneumonia or lesions   but  Radiologist says Readings "could be consistent with pneumonitis"  This  can be a viral chest infection  But doesn't tell us what caused the problem.  Baird Lyons can be from atypical bacterial infection. Viral infections  resolve on their  own .  [name]   We can add an antibiotic   If you are not improved by Sunday     If  Sinus drainage continues we can treat for bacterial sinus infection . But today the clear mucous without pain of sinuses is more consitent with a viral infection.  That you body is trying to resolve.      Standley Brooking. Maddyson Keil M.D.

## 2018-08-13 NOTE — Patient Instructions (Addendum)
Your exam is reassuring    And chest lung exam is clear .   Chest x ray  Shows no  Pneumonia or lesions   but  Radiologist says Readings "could be consistent with pneumonitis"  This  can be a viral chest infection  But doesn't tell us what caused the problem.  Baird Lyons can be from atypical bacterial infection. Viral infections  resolve on their  own .  [name]   We can add an antibiotic   If you are not improved by Sunday     If  Sinus drainage continues we can treat for bacterial sinus infection . But today the clear mucous without pain of sinuses is more consitent with a viral infection.  That you body is trying to resolve.

## 2018-08-25 ENCOUNTER — Ambulatory Visit: Payer: Self-pay | Admitting: *Deleted

## 2018-08-25 NOTE — Telephone Encounter (Signed)
Pt called wanting to know if she should take the antibiotic that was prescribed for her to take on her last visit with her pcp on 08/23. Pt stated that her provider thought she might have a sinus infection and prescribed a zpack for her if she was not any better.  Pt stated that she does not have a fever but has a red throat, headache today. And yesterday, a sore throat, runny nose, and headache. And feeling some pressure under her eyes.  She has been taking cold med and Tylenol. She is planning on taking a flight to [place] on next Thursday and wanted her head to be cleared up before getting on the plane. Advised there to go ahead and start taking the antibiotic. Encouraged fluids and used a humidifier at night.   If she is not feeling better by Monday to call the office back.  Pt voiced understanding.  Reason for Disposition . Caller has medication question, adult has minor symptoms, caller declines triage, and triager answers question  Answer Assessment - Initial Assessment Questions 1. SYMPTOMS: "Do you have any symptoms?"     Yes, runny nose and headache, sore throat yesterday and red throat and headache today. 2. SEVERITY: If symptoms are present, ask "Are they mild, moderate or severe?"     Mild to moderate  Protocols used: MEDICATION QUESTION CALL-A-AH

## 2018-08-26 NOTE — Telephone Encounter (Signed)
Will send to Dr Regis Bill as Juluis Rainier and for any further recommendations.

## 2018-08-26 NOTE — Telephone Encounter (Signed)
agree

## 2018-08-26 NOTE — Telephone Encounter (Signed)
Patient given rec's again. Nothing further needed.

## 2018-08-30 ENCOUNTER — Ambulatory Visit: Payer: Medicare Other | Admitting: Podiatry

## 2018-08-30 ENCOUNTER — Encounter: Payer: Self-pay | Admitting: Podiatry

## 2018-08-30 DIAGNOSIS — D3613 Benign neoplasm of peripheral nerves and autonomic nervous system of lower limb, including hip: Secondary | ICD-10-CM

## 2018-08-30 DIAGNOSIS — D3610 Benign neoplasm of peripheral nerves and autonomic nervous system, unspecified: Secondary | ICD-10-CM

## 2018-08-31 NOTE — Progress Notes (Signed)
Subjective:   Patient ID: Christina Myers, female   DOB: 77 y.o.   MRN: [id number]   HPI Patient states she still has pain at night with her nerve and it seems to have improved some with the injections but still present   ROS      Objective:  Physical Exam  Neurovascular status intact with continued prominence and discomfort third intermetatarsal space left with improvement from when we started with this problem     Assessment:  Neuroma symptomatology still present left with improvement     Plan:  I did go ahead today and I did a sterile prep of the area and I injected directly into the nerve root prior to breaking his digital branches with a purified alcohol steroid Marcaine solution and discussed ultimate excision of the mass if it remains painful.  She is going to [place] in [place] and will be seen back when she returns

## 2018-09-14 DIAGNOSIS — H35423 Microcystoid degeneration of retina, bilateral: Secondary | ICD-10-CM | POA: Diagnosis not present

## 2018-09-14 DIAGNOSIS — H353112 Nonexudative age-related macular degeneration, right eye, intermediate dry stage: Secondary | ICD-10-CM | POA: Diagnosis not present

## 2018-09-14 DIAGNOSIS — H353221 Exudative age-related macular degeneration, left eye, with active choroidal neovascularization: Secondary | ICD-10-CM | POA: Diagnosis not present

## 2018-09-14 DIAGNOSIS — H43821 Vitreomacular adhesion, right eye: Secondary | ICD-10-CM | POA: Diagnosis not present

## 2018-10-11 ENCOUNTER — Encounter: Payer: Self-pay | Admitting: Podiatry

## 2018-10-11 ENCOUNTER — Ambulatory Visit: Payer: Medicare Other | Admitting: Podiatry

## 2018-10-11 DIAGNOSIS — G5762 Lesion of plantar nerve, left lower limb: Secondary | ICD-10-CM

## 2018-10-11 DIAGNOSIS — D3610 Benign neoplasm of peripheral nerves and autonomic nervous system, unspecified: Secondary | ICD-10-CM

## 2018-10-13 ENCOUNTER — Other Ambulatory Visit: Payer: Self-pay | Admitting: Podiatry

## 2018-10-13 ENCOUNTER — Telehealth: Payer: Self-pay | Admitting: Podiatry

## 2018-10-13 MED ORDER — DICLOFENAC SODIUM 75 MG PO TBEC: 75.0000 mg | DELAYED_RELEASE_TABLET | Freq: Two times a day (BID) | ORAL | 2 refills | Status: DC

## 2018-10-13 NOTE — Addendum Note (Signed)
Addended by: Harriett Sine D on: 10/13/2018 05:22 PM   Modules accepted: Orders

## 2018-10-13 NOTE — Telephone Encounter (Signed)
Pt was suppose to receive an anti-inflammatory and her pharmacy didn't receive it. Please give patient a call

## 2018-10-13 NOTE — Telephone Encounter (Signed)
Dr. Paulla Dolly ordered Diclofenac 75mg  #30 one tablet bid +2refills. I informed pt of Dr. Mellody Drown orders.

## 2018-10-14 NOTE — Progress Notes (Signed)
Subjective:   Patient ID: Christina Myers, female   DOB: 77 y.o.   MRN: [id number]   HPI Patient presents stating that the top of the left foot still is doing the same and it bothers her mostly at nighttime neurovascular status intact with continued positive Biagio Borg sign left   ROS      Objective:  Physical Exam  Above mentions positive Biagio Borg     Assessment:  Patient is probably developing with chronic low-grade neuroma symptoms left     Plan:  Education concerning condition given to patient discussed treatment options and at this point working to go ahead and just treat conservatively and utilize diclofenac gel and then consider other treatments at one point in future with surgery most likely necessary when she decides at the right time

## 2018-10-19 MED ORDER — DICLOFENAC SODIUM 1 % TD GEL: 4.0000 g | Freq: Four times a day (QID) | TRANSDERMAL | 5 refills | Status: DC

## 2018-10-19 NOTE — Addendum Note (Signed)
Addended by: Celene Skeen A on: 10/19/2018 09:16 AM   Modules accepted: Orders

## 2018-11-03 DIAGNOSIS — R197 Diarrhea, unspecified: Secondary | ICD-10-CM | POA: Diagnosis not present

## 2018-11-03 DIAGNOSIS — K625 Hemorrhage of anus and rectum: Secondary | ICD-10-CM | POA: Diagnosis not present

## 2018-11-05 DIAGNOSIS — R197 Diarrhea, unspecified: Secondary | ICD-10-CM | POA: Diagnosis not present

## 2018-11-23 DIAGNOSIS — H353221 Exudative age-related macular degeneration, left eye, with active choroidal neovascularization: Secondary | ICD-10-CM | POA: Diagnosis not present

## 2018-11-23 DIAGNOSIS — H35423 Microcystoid degeneration of retina, bilateral: Secondary | ICD-10-CM | POA: Diagnosis not present

## 2018-11-23 DIAGNOSIS — H43813 Vitreous degeneration, bilateral: Secondary | ICD-10-CM | POA: Diagnosis not present

## 2018-11-23 DIAGNOSIS — H353112 Nonexudative age-related macular degeneration, right eye, intermediate dry stage: Secondary | ICD-10-CM | POA: Diagnosis not present

## 2018-12-20 DIAGNOSIS — A09 Infectious gastroenteritis and colitis, unspecified: Secondary | ICD-10-CM | POA: Diagnosis not present

## 2018-12-20 DIAGNOSIS — K580 Irritable bowel syndrome with diarrhea: Secondary | ICD-10-CM | POA: Diagnosis not present

## 2018-12-27 ENCOUNTER — Other Ambulatory Visit: Payer: Self-pay | Admitting: Internal Medicine

## 2018-12-28 NOTE — Progress Notes (Signed)
Chief Complaint  Patient presents with  . Annual Exam    Pt is concerned about back pain pt is wondering if it is due osteoporsis and wants to know if she needs a another bone density. Pt states that back pain keeps her up at night.   . Hypothyroidism  . Hyperlipidemia    HPI: Christina Myers 78 y.o. comes in today for Preventive Medicare exam/ wellness visit .Since last visit.  Thyroid  Taking no se   Lipids: nose of meds noted  Needs refill   For 6-7 mos has had  LB ache points below ls area bilateral  Sometimes    Interrupts sleep   .   No real uti sx .     About 7 months.   No  radiation  No fever  No injury    Had e coli diarrhea   in stool issue.    Per Dr.  Earlean Myers  Using  Probiotics  Health Maintenance  Topic Date Due  . TETANUS/TDAP  12/22/2018  . INFLUENZA VACCINE  Completed  . DEXA SCAN  Completed  . PNA vac Low Risk Adult  Completed   Health Maintenance Review LIFESTYLE:  Exercise:  Dance classes 3   Tobacco/ETS: no  Alcohol:  Seldom  Sugar beverages: stevia  ocass  Diet greentea Sleep: tries  For 8   But lower back pain .    Drug use: no  HH: 2 cat    Hearing: ok  Vision:  No limitations at present . Last eye check UTD  Safety:  Has smoke detector and wears seat belts.  No firearms. No excess sun exposure. Sees dentist regularly.  Falls: n  Memory: Felt to be good  , no concern from her or her family.  Depression: No anhedonia unusual crying or depressive symptoms  Nutrition: Eats well balanced diet; adequate calcium and vitamin D. No swallowing chewing problems.  Injury: no major injuries in the last six months.  Other healthcare providers:  Reviewed today .  Preventive parameters: up-to-date  Reviewed   ADLS:   There are no problems or need for assistance  driving, feeding, obtaining food, dressing, toileting and bathing, managing money using phone. She is independent.   ROS:  See above  GEN/ HEENT: No fever, significant weight changes  sweats headaches vision problems hearing changes, CV/ PULM; No chest pain shortness of breath cough, syncope,edema  change in exercise tolerance. GI /GU: No adominal pain, vomiting, change in bowel habits. No blood in the stool. No significant GU symptoms. SKIN/HEME: ,no acute skin rashes suspicious lesions or bleeding. No lymphadenopathy, nodules, masses.  NEURO/ PSYCH:  No neurologic signs such as weakness numbness. No depression anxiety. IMM/ Allergy: No unusual infections.  Allergy .   REST of 12 system review negative except as per HPI   Past Medical History:  Diagnosis Date  . COLONIC POLYPS, HX OF 08/30/2007   Qualifier: Diagnosis of  By: Rogue Bussing CMA, Maryann Alar    . Hx of colonic polyps   . Hypertension   . Hypothyroidism   . OSTEOARTHRITIS, HAND 04/24/2009   Qualifier: Diagnosis of  By: Regis Bill MD, Standley Brooking   . PVC (premature ventricular contraction)    [facility] evaluation 7/03  . Seasonal allergies   . Skin cancer    hx of bcca of face  . SKIN CANCER, HX OF 02/02/2008   Qualifier: Diagnosis of  By: Regis Bill MD, Standley Brooking     Family History  Problem Relation Age  of Onset  . Cancer Other        colon, prostate   . Diabetes Other   . Stroke Other   . Heart disease Other   . Colon cancer Mother     Social History   Socioeconomic History  . Marital status: Married    Spouse name: Not on file  . Number of children: Not on file  . Years of education: Not on file  . Highest education level: Not on file  Occupational History  . Not on file  Social Needs  . Financial resource strain: Not on file  . Food insecurity:    Worry: Not on file    Inability: Not on file  . Transportation needs:    Medical: Not on file    Non-medical: Not on file  Tobacco Use  . Smoking status: Former Research scientist (life sciences)  . Smokeless tobacco: Never Used  Substance and Sexual Activity  . Alcohol use: Yes    Comment: rarely  . Drug use: No  . Sexual activity: Not on file  Lifestyle  . Physical activity:     Days per week: Not on file    Minutes per session: Not on file  . Stress: Not on file  Relationships  . Social connections:    Talks on phone: Not on file    Gets together: Not on file    Attends religious service: Not on file    Active member of club or organization: Not on file    Attends meetings of clubs or organizations: Not on file    Relationship status: Not on file  Other Topics Concern  . Not on file  Social History Narrative   Married    Former smoker   Set designer and teaches tap dancing.    No etoh   HH of 2     Outpatient Encounter Medications as of 12/29/2018  Medication Sig  . aspirin 81 MG tablet Take 81 mg by mouth daily.    [name] BIOTIN PO Take by mouth.  . Cholecalciferol (VITAMIN D3) 2000 units TABS Take 2,000 Units by mouth daily.  . diclofenac sodium (VOLTAREN) 1 % GEL Apply 4 g topically 4 (four) times daily.  . Eluxadoline (VIBERZI) 75 MG TABS Take by mouth as needed.  [name] estradiol (ESTRACE) 0.1 MG/GM vaginal cream Place 2 g vaginally daily.    . simvastatin (ZOCOR) 20 MG tablet Take 1 tablet (20 mg total) by mouth at bedtime.  [name] SYNTHROID 100 MCG tablet Take 1 tablet (100 mcg total) by mouth daily.  [name] tobramycin (TOBREX) 0.3 % ophthalmic solution   . [DISCONTINUED] azithromycin (ZITHROMAX Z-PAK) 250 MG tablet Take 2 po first day, then 1 po qd  . [DISCONTINUED] simvastatin (ZOCOR) 20 MG tablet Take 1 tablet (20 mg total) by mouth at bedtime.  . [DISCONTINUED] SYNTHROID 100 MCG tablet TAKE 1 TABLET BY MOUTH EVERY DAY   No facility-administered encounter medications on file as of 12/29/2018.     EXAM:  BP 114/64 (BP Location: Right Arm, Patient Position: Sitting, Cuff Size: Normal)   Pulse 64   Temp (!) 97.5 F (36.4 C) (Oral)   Ht '5\' 2"'$  (1.575 m)   Wt 130 lb (59 kg) Comment: pt reported  BMI 23.78 kg/m   Body mass index is 23.78 kg/m. Weight declined  Physical Exam: Vital signs reviewed [id number]:[id number] is a well-developed well-nourished alert  cooperative   who appears stated age in no acute distress.  HEENT: normocephalic atraumatic , Eyes:  PERRL EOM's full, conjunctiva clear, Nares: paten,t no deformity discharge or tenderness., Ears: no deformity EAC's clear TMs with normal landmarks. Mouth: clear OP, no lesions, edema.  Moist mucous membranes. Dentition in adequate repair. NECK: supple without masses, thyromegaly or bruits. CHEST/PULM:  Clear to auscultation and percussion breath sounds equal no wheeze , rales or rhonchi. CV: PMI is nondisplaced, S1 S2 no gallops, murmurs, rubs. Peripheral pulses are full without delay.No JVD .  ABDOMEN: Bowel sounds normal nontender  No guard or rebound, no hepato splenomegal no CVA tenderness.   Extremtities:  No clubbing cyanosis or edema, no acute joint swelling or redness no focal atrophy no mid line  Tenderness   No lesion and nl flexibilty  NEURO:  Oriented x3, cranial nerves 3-12 appear to be intact, no obvious focal weakness,gait within normal limits no abnormal reflexes or asymmetrical SKIN: No acute rashes normal turgor, color, no bruising or petechiae. PSYCH: Oriented, good eye contact, no obvious depression anxiety, cognition and judgment appear normal. LN: no cervical axillary il adenopathy No noted deficits in memory, attention, and speech.   Lab Results  Component Value Date   WBC 6.9 12/29/2018   HGB 14.1 12/29/2018   HCT 41.4 12/29/2018   PLT 287.0 12/29/2018   GLUCOSE 88 12/29/2018   CHOL 195 12/29/2018   TRIG 202.0 (H) 12/29/2018   HDL 53.50 12/29/2018   LDLDIRECT 107.0 12/29/2018   LDLCALC 83 11/19/2016   ALT 14 12/29/2018   AST 16 12/29/2018   NA 142 12/29/2018   K 3.9 12/29/2018   CL 105 12/29/2018   CREATININE 0.72 12/29/2018   BUN 16 12/29/2018   CO2 28 12/29/2018   TSH 0.19 (L) 12/29/2018   INR 1.0 ratio 11/27/2010    ASSESSMENT AND PLAN:  Discussed the following assessment and plan:  Visit for preventive health examination  Medication management -  Plan: Basic metabolic panel, CBC with Differential/Platelet, Hepatic function panel, POCT Urinalysis Dipstick (Automated), Lipid panel, TSH  Hypothyroidism, unspecified type - Plan: Basic metabolic panel, CBC with Differential/Platelet, Hepatic function panel, POCT Urinalysis Dipstick (Automated), Lipid panel, TSH  HYPERLIPIDEMIA - Plan: Basic metabolic panel, CBC with Differential/Platelet, Hepatic function panel, POCT Urinalysis Dipstick (Automated), Lipid panel, TSH  Midline low back pain without sciatica, unspecified chronicity - Plan: Basic metabolic panel, CBC with Differential/Platelet, Hepatic function panel, POCT Urinalysis Dipstick (Automated), Lipid panel, TSH Refilled med   Will wait for labs  Ok to see   Ortho about the back   Ongoing  She is physically active and doesn't fit with osteoporosis  Do f/u dexa next year  Patient Care Team: Burnis Medin, MD as PCP - Sandford Craze, MD (Dermatology) Richmond Campbell, MD (Gastroenterology) Elsie Saas, MD (Orthopedic Surgery) Sherlynn Stalls, MD as Consulting Physician (Ophthalmology) [place], [place], [place] [facility])  Patient Instructions  May want to see  Your ortho  For the low back pain .   Will check   Labs  Today and urine check .   Before next CPX   please contact me ahead of time to be able to  Put in lab orders   If you want to get the blood tests ahead  of times . Will refill your medications       Preventive Care 65 Years and Older, Female Preventive care refers to lifestyle choices and visits with your health care provider that can promote health and wellness. What does preventive care include?  A yearly physical exam. This is also called an annual  well check.  Dental exams once or twice a year.  Routine eye exams. Ask your health care provider how often you should have your eyes checked.  Personal lifestyle choices, including: ? Daily care of your teeth and gums. ? Regular physical  activity. ? Eating a healthy diet. ? Avoiding tobacco and drug use. ? Limiting alcohol use. ? Practicing safe sex. ? Taking low-dose aspirin every day. ? Taking vitamin and mineral supplements as recommended by your health care provider. What happens during an annual well check? The services and screenings done by your health care provider during your annual well check will depend on your age, overall health, lifestyle risk factors, and family history of disease. Counseling Your health care provider may ask you questions about your:  Alcohol use.  Tobacco use.  Drug use.  Emotional well-being.  Home and relationship well-being.  Sexual activity.  Eating habits.  History of falls.  Memory and ability to understand (cognition).  Work and work Statistician.  Reproductive health.  Screening You may have the following tests or measurements:  Height, weight, and BMI.  Blood pressure.  Lipid and cholesterol levels. These may be checked every 5 years, or more frequently if you are over 94 years old.  Skin check.  Lung cancer screening. You may have this screening every year starting at age 10 if you have a 30-pack-year history of smoking and currently smoke or have quit within the past 15 years.  Colorectal cancer screening. All adults should have this screening starting at age 64 and continuing until age 59. You will have tests every 1-10 years, depending on your results and the type of screening test. People at increased risk should start screening at an earlier age. Screening tests may include: ? Guaiac-based fecal occult blood testing. ? Fecal immunochemical test (FIT). ? Stool DNA test. ? Virtual colonoscopy. ? Sigmoidoscopy. During this test, a flexible tube with a tiny camera (sigmoidoscope) is used to examine your rectum and lower colon. The sigmoidoscope is inserted through your anus into your rectum and lower colon. ? Colonoscopy. During this test, a long, thin,  flexible tube with a tiny camera (colonoscope) is used to examine your entire colon and rectum.  Hepatitis C blood test.  Hepatitis B blood test.  Sexually transmitted disease (STD) testing.  Diabetes screening. This is done by checking your blood sugar (glucose) after you have not eaten for a while (fasting). You may have this done every 1-3 years.  Bone density scan. This is done to screen for osteoporosis. You may have this done starting at age 20.  Mammogram. This may be done every 1-2 years. Talk to your health care provider about how often you should have regular mammograms. Talk with your health care provider about your test results, treatment options, and if necessary, the need for more tests. Vaccines Your health care provider may recommend certain vaccines, such as:  Influenza vaccine. This is recommended every year.  Tetanus, diphtheria, and acellular pertussis (Tdap, Td) vaccine. You may need a Td booster every 10 years.  Varicella vaccine. You may need this if you have not been vaccinated.  Zoster vaccine. You may need this after age 72.  Measles, mumps, and rubella (MMR) vaccine. You may need at least one dose of MMR if you were born in 1957 or later. You may also need a second dose.  Pneumococcal 13-valent conjugate (PCV13) vaccine. One dose is recommended after age 58.  Pneumococcal polysaccharide (PPSV23) vaccine. One dose is recommended  after age 12.  Meningococcal vaccine. You may need this if you have certain conditions.  Hepatitis A vaccine. You may need this if you have certain conditions or if you travel or work in places where you may be exposed to hepatitis A.  Hepatitis B vaccine. You may need this if you have certain conditions or if you travel or work in places where you may be exposed to hepatitis B.  Haemophilus influenzae type b (Hib) vaccine. You may need this if you have certain conditions. Talk to your health care provider about which screenings  and vaccines you need and how often you need them. This information is not intended to replace advice given to you by your health care provider. Make sure you discuss any questions you have with your health care provider. Document Released: 01/04/2016 Document Revised: 01/28/2018 Document Reviewed: 10/09/2015 Elsevier Interactive Patient Education  2019 [place] K. Woody Kronberg M.D.

## 2018-12-29 ENCOUNTER — Ambulatory Visit (INDEPENDENT_AMBULATORY_CARE_PROVIDER_SITE_OTHER): Payer: Medicare Other | Admitting: Internal Medicine

## 2018-12-29 ENCOUNTER — Encounter: Payer: Self-pay | Admitting: Internal Medicine

## 2018-12-29 VITALS — BP 114/64 | HR 64 | Temp 97.5°F | Ht 62.0 in | Wt 130.0 lb

## 2018-12-29 DIAGNOSIS — M545 Low back pain, unspecified: Secondary | ICD-10-CM

## 2018-12-29 DIAGNOSIS — Z0000 Encounter for general adult medical examination without abnormal findings: Secondary | ICD-10-CM

## 2018-12-29 DIAGNOSIS — R3989 Other symptoms and signs involving the genitourinary system: Secondary | ICD-10-CM

## 2018-12-29 DIAGNOSIS — E782 Mixed hyperlipidemia: Secondary | ICD-10-CM | POA: Diagnosis not present

## 2018-12-29 DIAGNOSIS — E039 Hypothyroidism, unspecified: Secondary | ICD-10-CM | POA: Diagnosis not present

## 2018-12-29 DIAGNOSIS — Z79899 Other long term (current) drug therapy: Secondary | ICD-10-CM

## 2018-12-29 LAB — CBC WITH DIFFERENTIAL/PLATELET
Basophils Absolute: 0 10*3/uL (ref 0.0–0.1)
Basophils Relative: 0.4 % (ref 0.0–3.0)
Eosinophils Absolute: 0.1 10*3/uL (ref 0.0–0.7)
Eosinophils Relative: 1.1 % (ref 0.0–5.0)
HCT: 41.4 % (ref 36.0–46.0)
Hemoglobin: 14.1 g/dL (ref 12.0–15.0)
Lymphocytes Relative: 35 % (ref 12.0–46.0)
Lymphs Abs: 2.4 10*3/uL (ref 0.7–4.0)
MCHC: 34.1 g/dL (ref 30.0–36.0)
MCV: 92.6 fl (ref 78.0–100.0)
Monocytes Absolute: 0.5 10*3/uL (ref 0.1–1.0)
Monocytes Relative: 7.8 % (ref 3.0–12.0)
Neutro Abs: 3.8 10*3/uL (ref 1.4–7.7)
Neutrophils Relative %: 55.7 % (ref 43.0–77.0)
Platelets: 287 10*3/uL (ref 150.0–400.0)
RBC: 4.47 Mil/uL (ref 3.87–5.11)
RDW: 13.5 % (ref 11.5–15.5)
WBC: 6.9 10*3/uL (ref 4.0–10.5)

## 2018-12-29 LAB — LIPID PANEL
Cholesterol: 195 mg/dL (ref 0–200)
HDL: 53.5 mg/dL (ref >39.00)
NonHDL: 141.54
Total CHOL/HDL Ratio: 4
Triglycerides: 202 mg/dL — ABNORMAL HIGH (ref 0.0–149.0)
VLDL: 40.4 mg/dL — ABNORMAL HIGH (ref 0.0–40.0)

## 2018-12-29 LAB — BASIC METABOLIC PANEL
BUN: 16 mg/dL (ref 6–23)
CALCIUM: 9.2 mg/dL (ref 8.4–10.5)
CO2: 28 mEq/L (ref 19–32)
CREATININE: 0.72 mg/dL (ref 0.40–1.20)
Chloride: 105 mEq/L (ref 96–112)
GFR: 82.93 mL/min (ref >60.00)
Glucose, Bld: 88 mg/dL (ref 70–99)
Potassium: 3.9 mEq/L (ref 3.5–5.1)
Sodium: 142 mEq/L (ref 135–145)

## 2018-12-29 LAB — POC URINALSYSI DIPSTICK (AUTOMATED)
Bilirubin, UA: NEGATIVE
Blood, UA: POSITIVE
Glucose, UA: NEGATIVE
Ketones, UA: NEGATIVE
Leukocytes, UA: NEGATIVE
Nitrite, UA: NEGATIVE
PH UA: 5.5 (ref 5.0–8.0)
PROTEIN UA: POSITIVE — AB
Spec Grav, UA: >=1.03 — AB (ref 1.010–1.025)
Urobilinogen, UA: 0.2 E.U./dL

## 2018-12-29 LAB — HEPATIC FUNCTION PANEL
ALT: 14 U/L (ref 0–35)
AST: 16 U/L (ref 0–37)
Albumin: 4.3 g/dL (ref 3.5–5.2)
Alkaline Phosphatase: 91 U/L (ref 39–117)
Bilirubin, Direct: 0.1 mg/dL (ref 0.0–0.3)
Total Bilirubin: 0.4 mg/dL (ref 0.2–1.2)
Total Protein: 6.4 g/dL (ref 6.0–8.3)

## 2018-12-29 LAB — TSH: TSH: 0.19 u[IU]/mL — ABNORMAL LOW (ref 0.35–4.50)

## 2018-12-29 LAB — LDL CHOLESTEROL, DIRECT: Direct LDL: 107 mg/dL

## 2018-12-29 MED ORDER — SYNTHROID 100 MCG PO TABS: 100.0000 ug | ORAL_TABLET | Freq: Every day | ORAL | 3 refills | Status: DC

## 2018-12-29 MED ORDER — SIMVASTATIN 20 MG PO TABS: 20.0000 mg | ORAL_TABLET | Freq: Every day | ORAL | 3 refills | Status: DC

## 2018-12-29 NOTE — Patient Instructions (Addendum)
May want to see  Your ortho  For the low back pain .   Will check   Labs  Today and urine check .   Before next CPX   please contact me ahead of time to be able to  Put in lab orders   If you want to get the blood tests ahead  of times . Will refill your medications       Preventive Care 65 Years and Older, Female Preventive care refers to lifestyle choices and visits with your health care provider that can promote health and wellness. What does preventive care include?  A yearly physical exam. This is also called an annual well check.  Dental exams once or twice a year.  Routine eye exams. Ask your health care provider how often you should have your eyes checked.  Personal lifestyle choices, including: ? Daily care of your teeth and gums. ? Regular physical activity. ? Eating a healthy diet. ? Avoiding tobacco and drug use. ? Limiting alcohol use. ? Practicing safe sex. ? Taking low-dose aspirin every day. ? Taking vitamin and mineral supplements as recommended by your health care provider. What happens during an annual well check? The services and screenings done by your health care provider during your annual well check will depend on your age, overall health, lifestyle risk factors, and family history of disease. Counseling Your health care provider may ask you questions about your:  Alcohol use.  Tobacco use.  Drug use.  Emotional well-being.  Home and relationship well-being.  Sexual activity.  Eating habits.  History of falls.  Memory and ability to understand (cognition).  Work and work Statistician.  Reproductive health.  Screening You may have the following tests or measurements:  Height, weight, and BMI.  Blood pressure.  Lipid and cholesterol levels. These may be checked every 5 years, or more frequently if you are over 53 years old.  Skin check.  Lung cancer screening. You may have this screening every year starting at age 34 if you have  a 30-pack-year history of smoking and currently smoke or have quit within the past 15 years.  Colorectal cancer screening. All adults should have this screening starting at age 49 and continuing until age 55. You will have tests every 1-10 years, depending on your results and the type of screening test. People at increased risk should start screening at an earlier age. Screening tests may include: ? Guaiac-based fecal occult blood testing. ? Fecal immunochemical test (FIT). ? Stool DNA test. ? Virtual colonoscopy. ? Sigmoidoscopy. During this test, a flexible tube with a tiny camera (sigmoidoscope) is used to examine your rectum and lower colon. The sigmoidoscope is inserted through your anus into your rectum and lower colon. ? Colonoscopy. During this test, a long, thin, flexible tube with a tiny camera (colonoscope) is used to examine your entire colon and rectum.  Hepatitis C blood test.  Hepatitis B blood test.  Sexually transmitted disease (STD) testing.  Diabetes screening. This is done by checking your blood sugar (glucose) after you have not eaten for a while (fasting). You may have this done every 1-3 years.  Bone density scan. This is done to screen for osteoporosis. You may have this done starting at age 71.  Mammogram. This may be done every 1-2 years. Talk to your health care provider about how often you should have regular mammograms. Talk with your health care provider about your test results, treatment options, and if necessary, the need for  more tests. Vaccines Your health care provider may recommend certain vaccines, such as:  Influenza vaccine. This is recommended every year.  Tetanus, diphtheria, and acellular pertussis (Tdap, Td) vaccine. You may need a Td booster every 10 years.  Varicella vaccine. You may need this if you have not been vaccinated.  Zoster vaccine. You may need this after age 72.  Measles, mumps, and rubella (MMR) vaccine. You may need at least  one dose of MMR if you were born in 1957 or later. You may also need a second dose.  Pneumococcal 13-valent conjugate (PCV13) vaccine. One dose is recommended after age 68.  Pneumococcal polysaccharide (PPSV23) vaccine. One dose is recommended after age 57.  Meningococcal vaccine. You may need this if you have certain conditions.  Hepatitis A vaccine. You may need this if you have certain conditions or if you travel or work in places where you may be exposed to hepatitis A.  Hepatitis B vaccine. You may need this if you have certain conditions or if you travel or work in places where you may be exposed to hepatitis B.  Haemophilus influenzae type b (Hib) vaccine. You may need this if you have certain conditions. Talk to your health care provider about which screenings and vaccines you need and how often you need them. This information is not intended to replace advice given to you by your health care provider. Make sure you discuss any questions you have with your health care provider. Document Released: 01/04/2016 Document Revised: 01/28/2018 Document Reviewed: 10/09/2015 Elsevier Interactive Patient Education  2019 [employer].

## 2019-01-03 ENCOUNTER — Ambulatory Visit: Payer: Self-pay | Admitting: *Deleted

## 2019-01-03 ENCOUNTER — Telehealth: Payer: Self-pay | Admitting: Internal Medicine

## 2019-01-03 ENCOUNTER — Other Ambulatory Visit (INDEPENDENT_AMBULATORY_CARE_PROVIDER_SITE_OTHER): Payer: Medicare Other

## 2019-01-03 DIAGNOSIS — R3989 Other symptoms and signs involving the genitourinary system: Secondary | ICD-10-CM

## 2019-01-03 LAB — URINALYSIS, ROUTINE W REFLEX MICROSCOPIC
Bilirubin Urine: NEGATIVE
NITRITE: NEGATIVE
Specific Gravity, Urine: 1.02 (ref 1.000–1.030)
Total Protein, Urine: 30 — AB
Urine Glucose: NEGATIVE
Urobilinogen, UA: 0.2 (ref 0.0–1.0)
pH: 5.5 (ref 5.0–8.0)

## 2019-01-03 NOTE — Addendum Note (Signed)
Addended by: Modena Morrow R on: 01/03/2019 02:00 PM   Modules accepted: Orders

## 2019-01-03 NOTE — Telephone Encounter (Signed)
Will send to Dr Regis Bill to advise Previous U/A abnormal  Contains abnormal data POCT Urinalysis Dipstick (Automated)  Order: [id number]  Status:  Final result Visible to patient:  No (Not Released) Next appt:  None Dx:  HYPERLIPIDEMIA; Medication management...   Ref Range & Units 5d ago (12/29/18) 86yr ago (12/23/17) 72yr ago (02/27/17)  Color, UA  yellow  yellow  Yellow   Clarity, UA  clear  cloudyAbnormal   Cloudy   Glucose, UA Negative Negative  negative R Neg R  Bilirubin, UA  neg  negative  Neg   Ketones, UA  neg   Neg   Spec Grav, UA 1.010 - 1.025 >=1.030Abnormal   1.025  1.015 R  Blood, UA  Positive  smallAbnormal   Neg   Comment: 25Ery/uL   pH, UA 5.0 - 8.0 5.5  6.0  6.0 R  Protein, UA Negative PositiveAbnormal   negative R Trace R  Comment: 15mg /dL   Urobilinogen, UA 0.2 or 1.0 E.U./dL 0.2  0.2  negative R  Nitrite, UA  neg  PositiveAbnormal   Neg   Leukocytes, UA Negative Negative  Large (3+)Abnormal   small (1+)Abnormal    Ketones, POC UA   negative        Specimen Collected: 12/29/18 15:27 Last Resulted: 12/29/18 15:27

## 2019-01-03 NOTE — Telephone Encounter (Signed)
Pt calling to check status on her lab results. Pt aware that the results have not came back yet. Pt states she would like a call as soon as they come back if possible and if medication needs to be called in that that be done as well. Pt aware that it may be tomorrow before results are back.

## 2019-01-03 NOTE — Addendum Note (Signed)
Addended by: Modena Morrow R on: 01/03/2019 02:19 PM   Modules accepted: Orders

## 2019-01-03 NOTE — Telephone Encounter (Signed)
Copied from [place] [phone]. Topic: General - Inquiry >> Jan 03, 2019  1:29 PM Hamilton D wrote: Reason for CRM: Pt called to follow up on status of urine sample given last Wednesday. She would like to know if she has a urine sample and stated that symptoms have progressed. She has to return to work tomorrow and would like to be able to be given something for relief as soon as possible. Please advise.

## 2019-01-03 NOTE — Telephone Encounter (Signed)
Pt reports UTI symptoms, onset 1/10/20202. Saw Dr, Regis Bill 12/29/2018 Physical Exam. Pt states did not have symptoms then, did give urine specimen (Not released to [facility].)  Pt reports urgency, frequency, pressure at bladder area. Denies fever, back or flank pain. States urine cloudy, unsure of malodorous. Pt requesting results of UA and if RX needs to be called in for her. Pt made aware she may need to repeat specimen, appt. Please advise: [phone]  Reason for Disposition . Urinating more frequently than usual (i.e., frequency)  Answer Assessment - Initial Assessment Questions 1. SYMPTOM: "What's the main symptom you're concerned about?" (e.g., frequency, incontinence)     Frequency, urgency, bladder pressure 2. ONSET: "When did the  *No Answer*  start?"     12/31/2018 3. PAIN: "Is there any pain?" If so, ask: "How bad is it?" (Scale: 1-10; mild, moderate, severe)     no 4. CAUSE: "What do you think is causing the symptoms?"     UTI 5. OTHER SYMPTOMS: "Do you have any other symptoms?" (e.g., fever, flank pain, blood in urine, pain with urination)     Urine cloudy.  Protocols used: URINARY [facility]

## 2019-01-04 LAB — URINE CULTURE
MICRO NUMBER:: 46409
Result:: NO GROWTH
SPECIMEN QUALITY:: ADEQUATE

## 2019-01-04 LAB — PROTEIN / CREATININE RATIO, URINE
Creatinine, Urine: 83 mg/dL (ref 20–275)
Protein/Creat Ratio: 446 mg/g creat — ABNORMAL HIGH (ref 21–161)
Protein/Creatinine Ratio: 0.446 mg/mg creat — ABNORMAL HIGH (ref 0.021–0.16)
Total Protein, Urine: 37 mg/dL — ABNORMAL HIGH (ref 5–24)

## 2019-01-04 MED ORDER — SULFAMETHOXAZOLE-TRIMETHOPRIM 400-80 MG PO TABS: 1.0000 | ORAL_TABLET | Freq: Two times a day (BID) | ORAL | 0 refills | Status: DC

## 2019-01-07 ENCOUNTER — Other Ambulatory Visit: Payer: Self-pay

## 2019-01-07 DIAGNOSIS — R8290 Unspecified abnormal findings in urine: Secondary | ICD-10-CM

## 2019-01-07 NOTE — Progress Notes (Unsigned)
urin

## 2019-01-17 ENCOUNTER — Other Ambulatory Visit (INDEPENDENT_AMBULATORY_CARE_PROVIDER_SITE_OTHER): Payer: Medicare Other

## 2019-01-17 DIAGNOSIS — R809 Proteinuria, unspecified: Secondary | ICD-10-CM

## 2019-01-17 DIAGNOSIS — R8290 Unspecified abnormal findings in urine: Secondary | ICD-10-CM

## 2019-01-17 LAB — URINALYSIS, ROUTINE W REFLEX MICROSCOPIC
Bilirubin Urine: NEGATIVE
KETONES UR: NEGATIVE
Leukocytes, UA: NEGATIVE
Nitrite: NEGATIVE
Specific Gravity, Urine: 1.02 (ref 1.000–1.030)
Total Protein, Urine: NEGATIVE
Urine Glucose: NEGATIVE
Urobilinogen, UA: 0.2 (ref 0.0–1.0)
pH: 5.5 (ref 5.0–8.0)

## 2019-01-18 LAB — PROTEIN / CREATININE RATIO, URINE
CREATININE, URINE: 73 mg/dL (ref 20–275)
Protein/Creat Ratio: 178 mg/g creat — ABNORMAL HIGH (ref 21–161)
Protein/Creatinine Ratio: 0.178 mg/mg creat — ABNORMAL HIGH (ref 0.021–0.16)
Total Protein, Urine: 13 mg/dL (ref 5–24)

## 2019-01-18 LAB — EXTRA URINE SPECIMEN

## 2019-01-26 DIAGNOSIS — M545 Low back pain: Secondary | ICD-10-CM | POA: Diagnosis not present

## 2019-01-26 DIAGNOSIS — M25551 Pain in right hip: Secondary | ICD-10-CM | POA: Diagnosis not present

## 2019-01-26 DIAGNOSIS — M810 Age-related osteoporosis without current pathological fracture: Secondary | ICD-10-CM | POA: Diagnosis not present

## 2019-01-26 DIAGNOSIS — E559 Vitamin D deficiency, unspecified: Secondary | ICD-10-CM | POA: Diagnosis not present

## 2019-01-26 DIAGNOSIS — R5383 Other fatigue: Secondary | ICD-10-CM | POA: Diagnosis not present

## 2019-01-26 DIAGNOSIS — M47816 Spondylosis without myelopathy or radiculopathy, lumbar region: Secondary | ICD-10-CM | POA: Diagnosis not present

## 2019-01-26 DIAGNOSIS — M25552 Pain in left hip: Secondary | ICD-10-CM | POA: Diagnosis not present

## 2019-01-31 ENCOUNTER — Telehealth: Payer: Self-pay | Admitting: Internal Medicine

## 2019-01-31 LAB — VITAMIN D 25 HYDROXY (VIT D DEFICIENCY, FRACTURES): Vit D, 25-Hydroxy: 24

## 2019-01-31 NOTE — Telephone Encounter (Signed)
Copied from [place] [phone]. Topic: General - Inquiry >> Jan 31, 2019  2:25 PM Margot Ables wrote: Reason for CRM: Pt states Dr. Regis Bill and her ortho recommended a bone density exam. Pt would like to have order placed and scheduled at [facility].

## 2019-02-01 ENCOUNTER — Other Ambulatory Visit: Payer: Self-pay

## 2019-02-01 DIAGNOSIS — E2839 Other primary ovarian failure: Secondary | ICD-10-CM

## 2019-02-01 DIAGNOSIS — E039 Hypothyroidism, unspecified: Secondary | ICD-10-CM

## 2019-02-01 NOTE — Telephone Encounter (Signed)
Yes please place the order   Dx estrogen deficiency  , hypothyroid

## 2019-02-04 NOTE — Telephone Encounter (Signed)
Pt calling to find out when her bone density test has been ordered for.

## 2019-02-07 ENCOUNTER — Encounter: Payer: Self-pay | Admitting: Internal Medicine

## 2019-02-07 NOTE — Telephone Encounter (Signed)
Call pt and set her up far Bone Density on 02/08/2019 at 1:30

## 2019-02-07 NOTE — Telephone Encounter (Signed)
Pt calling again to find out when her Bone Density test will be ordered for. Pt states she has been waiting for over a week to find out when this has been ordered for. Pt can be reached at [phone]

## 2019-02-08 ENCOUNTER — Ambulatory Visit (INDEPENDENT_AMBULATORY_CARE_PROVIDER_SITE_OTHER)
Admission: RE | Admit: 2019-02-08 | Discharge: 2019-02-08 | Disposition: A | Discharge status: Home / Self Care | Payer: Medicare Other | Source: Ambulatory Visit

## 2019-02-08 DIAGNOSIS — E2839 Other primary ovarian failure: Secondary | ICD-10-CM

## 2019-02-08 DIAGNOSIS — E039 Hypothyroidism, unspecified: Secondary | ICD-10-CM

## 2019-02-09 DIAGNOSIS — H353221 Exudative age-related macular degeneration, left eye, with active choroidal neovascularization: Secondary | ICD-10-CM | POA: Diagnosis not present

## 2019-02-09 DIAGNOSIS — H43813 Vitreous degeneration, bilateral: Secondary | ICD-10-CM | POA: Diagnosis not present

## 2019-02-09 DIAGNOSIS — H43392 Other vitreous opacities, left eye: Secondary | ICD-10-CM | POA: Diagnosis not present

## 2019-02-09 DIAGNOSIS — H353112 Nonexudative age-related macular degeneration, right eye, intermediate dry stage: Secondary | ICD-10-CM | POA: Diagnosis not present

## 2019-02-11 NOTE — Progress Notes (Signed)
prote

## 2019-02-15 DIAGNOSIS — E2839 Other primary ovarian failure: Secondary | ICD-10-CM | POA: Diagnosis not present

## 2019-02-21 ENCOUNTER — Telehealth: Payer: Self-pay

## 2019-02-21 NOTE — Telephone Encounter (Signed)
Please  Sent to ehr ortho  And sned her a copy also )  See result note also

## 2019-02-21 NOTE — Telephone Encounter (Signed)
Copied from [place] [id number]. Topic: Quick Communication - See Telephone Encounter >> Feb 18, 2019  8:58 AM Burchel, Abbi R wrote: CRM for notification. See Telephone encounter for: 02/18/19.  Pt requesting that her Bone Density scan be sent to her Orthopaedist.  Appt. on Wednesday 02/23/2019  Dr.  Wandra Feinstein  Fx [phone]

## 2019-02-21 NOTE — Telephone Encounter (Signed)
Faxed report over to ehr ortho

## 2019-02-23 DIAGNOSIS — M47816 Spondylosis without myelopathy or radiculopathy, lumbar region: Secondary | ICD-10-CM | POA: Diagnosis not present

## 2019-03-02 ENCOUNTER — Other Ambulatory Visit: Payer: Self-pay | Admitting: Internal Medicine

## 2019-03-25 DIAGNOSIS — K120 Recurrent oral aphthae: Secondary | ICD-10-CM | POA: Diagnosis not present

## 2019-04-11 DIAGNOSIS — K140 Glossitis: Secondary | ICD-10-CM | POA: Diagnosis not present

## 2019-04-11 DIAGNOSIS — H938X3 Other specified disorders of ear, bilateral: Secondary | ICD-10-CM | POA: Diagnosis not present

## 2019-04-11 DIAGNOSIS — Z7289 Other problems related to lifestyle: Secondary | ICD-10-CM | POA: Diagnosis not present

## 2019-04-19 DIAGNOSIS — H353221 Exudative age-related macular degeneration, left eye, with active choroidal neovascularization: Secondary | ICD-10-CM | POA: Diagnosis not present

## 2019-04-21 ENCOUNTER — Other Ambulatory Visit: Payer: Self-pay | Admitting: Internal Medicine

## 2019-04-21 DIAGNOSIS — Z1231 Encounter for screening mammogram for malignant neoplasm of breast: Secondary | ICD-10-CM

## 2019-04-25 ENCOUNTER — Other Ambulatory Visit: Payer: Self-pay

## 2019-04-25 ENCOUNTER — Other Ambulatory Visit: Payer: Medicare Other

## 2019-04-25 DIAGNOSIS — R809 Proteinuria, unspecified: Secondary | ICD-10-CM

## 2019-04-26 LAB — PROTEIN / CREATININE RATIO, URINE
Creatinine, Urine: 111 mg/dL (ref 20–275)
Protein/Creat Ratio: 162 mg/g creat — ABNORMAL HIGH (ref 21–161)
Protein/Creatinine Ratio: 0.162 mg/mg creat — ABNORMAL HIGH (ref 0.021–0.16)
Total Protein, Urine: 18 mg/dL (ref 5–24)

## 2019-04-29 ENCOUNTER — Other Ambulatory Visit: Payer: Self-pay

## 2019-04-29 DIAGNOSIS — R8290 Unspecified abnormal findings in urine: Secondary | ICD-10-CM

## 2019-05-17 DIAGNOSIS — K120 Recurrent oral aphthae: Secondary | ICD-10-CM | POA: Diagnosis not present

## 2019-05-27 ENCOUNTER — Telehealth: Payer: Self-pay | Admitting: Internal Medicine

## 2019-05-27 NOTE — Telephone Encounter (Signed)
Received call from [facility] with [place]. PA for Synthroid 144mcg brand name approved 05/27/2019-05/26/2020  Copied from [place] [phone]. Topic: General - Inquiry >> May 27, 2019 10:30 AM Mathis Bud wrote: Reason for CRM: Patient called stating her insurance [employer] will be faxing a form to PCP Formally expectation for the medication SYNTHROID 100 MCG tablet.  Patient claims she cannot take the generic pill of this medication. Patient states she will need a refill in a week so if PCP can fill out the form sooner than later. Patient call back [phone]

## 2019-06-13 ENCOUNTER — Ambulatory Visit
Admission: RE | Admit: 2019-06-13 | Discharge: 2019-06-13 | Disposition: A | Discharge status: Home / Self Care | Payer: Medicare Other | Source: Ambulatory Visit | Attending: Internal Medicine | Admitting: Internal Medicine

## 2019-06-13 ENCOUNTER — Other Ambulatory Visit: Payer: Self-pay

## 2019-06-13 DIAGNOSIS — Z1231 Encounter for screening mammogram for malignant neoplasm of breast: Secondary | ICD-10-CM

## 2019-06-28 DIAGNOSIS — H43813 Vitreous degeneration, bilateral: Secondary | ICD-10-CM | POA: Diagnosis not present

## 2019-06-28 DIAGNOSIS — H35423 Microcystoid degeneration of retina, bilateral: Secondary | ICD-10-CM | POA: Diagnosis not present

## 2019-06-28 DIAGNOSIS — H353112 Nonexudative age-related macular degeneration, right eye, intermediate dry stage: Secondary | ICD-10-CM | POA: Diagnosis not present

## 2019-06-28 DIAGNOSIS — H353221 Exudative age-related macular degeneration, left eye, with active choroidal neovascularization: Secondary | ICD-10-CM | POA: Diagnosis not present

## 2019-06-29 DIAGNOSIS — D485 Neoplasm of uncertain behavior of skin: Secondary | ICD-10-CM | POA: Diagnosis not present

## 2019-06-29 DIAGNOSIS — L821 Other seborrheic keratosis: Secondary | ICD-10-CM | POA: Diagnosis not present

## 2019-06-29 DIAGNOSIS — Z85828 Personal history of other malignant neoplasm of skin: Secondary | ICD-10-CM | POA: Diagnosis not present

## 2019-06-29 DIAGNOSIS — L820 Inflamed seborrheic keratosis: Secondary | ICD-10-CM | POA: Diagnosis not present

## 2019-07-29 ENCOUNTER — Other Ambulatory Visit: Payer: Self-pay

## 2019-07-29 ENCOUNTER — Other Ambulatory Visit: Payer: Medicare Other

## 2019-07-29 DIAGNOSIS — R8290 Unspecified abnormal findings in urine: Secondary | ICD-10-CM

## 2019-07-30 LAB — PROTEIN / CREATININE RATIO, URINE
Creatinine, Urine: 70 mg/dL (ref 20–275)
Protein/Creat Ratio: 143 mg/g creat (ref 21–161)
Protein/Creatinine Ratio: 0.143 mg/mg creat (ref 0.021–0.16)
Total Protein, Urine: 10 mg/dL (ref 5–24)

## 2019-09-06 DIAGNOSIS — H353221 Exudative age-related macular degeneration, left eye, with active choroidal neovascularization: Secondary | ICD-10-CM | POA: Diagnosis not present

## 2019-09-06 DIAGNOSIS — H35423 Microcystoid degeneration of retina, bilateral: Secondary | ICD-10-CM | POA: Diagnosis not present

## 2019-09-06 DIAGNOSIS — H353112 Nonexudative age-related macular degeneration, right eye, intermediate dry stage: Secondary | ICD-10-CM | POA: Diagnosis not present

## 2019-09-06 DIAGNOSIS — H43821 Vitreomacular adhesion, right eye: Secondary | ICD-10-CM | POA: Diagnosis not present

## 2019-11-01 ENCOUNTER — Ambulatory Visit (INDEPENDENT_AMBULATORY_CARE_PROVIDER_SITE_OTHER): Payer: Medicare Other

## 2019-11-01 ENCOUNTER — Other Ambulatory Visit: Payer: Self-pay

## 2019-11-01 DIAGNOSIS — Z23 Encounter for immunization: Secondary | ICD-10-CM

## 2019-11-10 ENCOUNTER — Other Ambulatory Visit: Payer: Self-pay

## 2019-11-11 ENCOUNTER — Ambulatory Visit (INDEPENDENT_AMBULATORY_CARE_PROVIDER_SITE_OTHER): Payer: Medicare Other | Admitting: Internal Medicine

## 2019-11-11 ENCOUNTER — Encounter: Payer: Self-pay | Admitting: Internal Medicine

## 2019-11-11 VITALS — BP 118/62 | HR 66 | Temp 98.2°F | Wt 130.0 lb

## 2019-11-11 DIAGNOSIS — Z79899 Other long term (current) drug therapy: Secondary | ICD-10-CM | POA: Diagnosis not present

## 2019-11-11 DIAGNOSIS — M8580 Other specified disorders of bone density and structure, unspecified site: Secondary | ICD-10-CM

## 2019-11-11 DIAGNOSIS — R3989 Other symptoms and signs involving the genitourinary system: Secondary | ICD-10-CM | POA: Diagnosis not present

## 2019-11-11 DIAGNOSIS — E782 Mixed hyperlipidemia: Secondary | ICD-10-CM

## 2019-11-11 DIAGNOSIS — E039 Hypothyroidism, unspecified: Secondary | ICD-10-CM | POA: Diagnosis not present

## 2019-11-11 LAB — POCT URINALYSIS DIPSTICK
Bilirubin, UA: NEGATIVE
Blood, UA: POSITIVE
Glucose, UA: NEGATIVE
Ketones, UA: NEGATIVE
Nitrite, UA: POSITIVE
Protein, UA: POSITIVE — AB
Spec Grav, UA: 1.025 (ref 1.010–1.025)
Urobilinogen, UA: 0.2 E.U./dL
pH, UA: 6 (ref 5.0–8.0)

## 2019-11-11 MED ORDER — SULFAMETHOXAZOLE-TRIMETHOPRIM 800-160 MG PO TABS: 1.0000 | ORAL_TABLET | Freq: Two times a day (BID) | ORAL | 0 refills | Status: DC

## 2019-11-11 NOTE — Patient Instructions (Addendum)
treating for UTI   Get urine culture   Begin  Antibiotic . After getting urine culture specimen.   Let us know if not getting better

## 2019-11-11 NOTE — Progress Notes (Signed)
Chief Complaint  Patient presents with  . Urinary Tract Infection    HPI: Christina Myers 78 y.o. come in for urinary sx      Past hx of utis   noticed wed night   And  Christina Myers urinary  Urgency  And  UI .   No fever flank pain but dysuria   Thinking of going to [place] family t giving but having second thoughts about safelty. Left hip pain had dexa spring  No osteoporosis but dec  BD left hip?  Dr Layne Benton Taking viactiv and staying acctive  ROS: See pertinent positives and negatives per HPI.  Past Medical History:  Diagnosis Date  . COLONIC POLYPS, HX OF 08/30/2007   Qualifier: Diagnosis of  By: Rogue Bussing CMA, Maryann Alar    . Hx of colonic polyps   . Hypertension   . Hypothyroidism   . OSTEOARTHRITIS, HAND 04/24/2009   Qualifier: Diagnosis of  By: Regis Bill MD, Standley Brooking   . PVC (premature ventricular contraction)    [facility] evaluation 7/03  . Seasonal allergies   . Skin cancer    hx of bcca of face  . SKIN CANCER, HX OF 02/02/2008   Qualifier: Diagnosis of  By: Regis Bill MD, Standley Brooking     Family History  Problem Relation Age of Onset  . Cancer Other        colon, prostate   . Diabetes Other   . Stroke Other   . Heart disease Other   . Colon cancer Mother     Social History   Socioeconomic History  . Marital status: Married    Spouse name: Not on file  . Number of children: Not on file  . Years of education: Not on file  . Highest education level: Not on file  Occupational History  . Not on file  Social Needs  . Financial resource strain: Not on file  . Food insecurity    Worry: Not on file    Inability: Not on file  . Transportation needs    Medical: Not on file    Non-medical: Not on file  Tobacco Use  . Smoking status: Former Research scientist (life sciences)  . Smokeless tobacco: Never Used  Substance and Sexual Activity  . Alcohol use: Yes    Comment: rarely  . Drug use: No  . Sexual activity: Not on file  Lifestyle  . Physical activity    Days per week: Not on file    Minutes per  session: Not on file  . Stress: Not on file  Relationships  . Social Herbalist on phone: Not on file    Gets together: Not on file    Attends religious service: Not on file    Active member of club or organization: Not on file    Attends meetings of clubs or organizations: Not on file    Relationship status: Not on file  Other Topics Concern  . Not on file  Social History Narrative   Married    Former smoker   Set designer and teaches tap dancing.    No etoh   HH of 2     Outpatient Medications Prior to Visit  Medication Sig Dispense Refill  . aspirin 81 MG tablet Take 81 mg by mouth daily.      [name] BIOTIN PO Take by mouth.    . Cholecalciferol (VITAMIN D3) 2000 units TABS Take 2,000 Units by mouth daily.    . diclofenac sodium (VOLTAREN) 1 %  GEL Apply 4 g topically 4 (four) times daily. 100 Tube 5  . Eluxadoline (VIBERZI) 75 MG TABS Take by mouth as needed.    [name] estradiol (ESTRACE) 0.1 MG/GM vaginal cream Place 2 g vaginally daily.      . simvastatin (ZOCOR) 20 MG tablet Take 1 tablet (20 mg total) by mouth at bedtime. 90 tablet 3  . SYNTHROID 100 MCG tablet TAKE 1 TABLET BY MOUTH DAILY 90 tablet 3  . tobramycin (TOBREX) 0.3 % ophthalmic solution   4   No facility-administered medications prior to visit.      EXAM:  BP 118/62 (BP Location: Right Arm, Patient Position: Sitting, Cuff Size: Normal)   Pulse 66   Temp 98.2 F (36.8 C) (Temporal)   Wt 130 lb (59 kg)   SpO2 97%   BMI 23.78 kg/m   Body mass index is 23.78 kg/m.  GENERAL: vitals reviewed and listed above, alert, oriented, appears well hydrated and in no acute distress HEENT: atraumatic, conjunctiva  clear, no obvious abnormalities on inspection of external nose and ears OP : NECK: no obvious masses on inspection palpation  MS: moves all extremities without noticeable focal  abnormality PSYCH: pleasant and cooperative, no obvious depression or anxiety  BP Readings from Last 3 Encounters:   11/11/19 118/62  12/29/18 114/64  08/13/18 100/62  ua po  Leuk blood  Nitrites ( sample from home this am)   ASSESSMENT AND PLAN:  Discussed the following assessment and plan:  Suspected urinary tract infection - Plan: POC Urinalysis Dipstick, Urine Culture  Medication management - Plan: TSH, T4, Free (Thyrox), Lipid panel, Basic metabolic panel, CBC with Differential, Hepatic function panel  HYPERLIPIDEMIA - Plan: TSH, T4, Free (Thyrox), Lipid panel, Basic metabolic panel, CBC with Differential, Hepatic function panel  Hypothyroidism, unspecified type - Plan: TSH, T4, Free (Thyrox), Lipid panel, Basic metabolic panel, CBC with Differential, Hepatic function panel  Osteopenia, unspecified location - Plan: TSH, T4, Free (Thyrox), Lipid panel, Basic metabolic panel, CBC with Differential, Hepatic function panel emprici rx uti rx  And u cx pending   Disc travel and risk and  Barriers      After patient left noted  Low tsh from jan 2020  Not sure if  dosage changed  Did not have fu tsh in record    Do not see in record  So this will be needed soon before next visit   Future labs placed in  Record dexa due in 2022 -Patient advised to return or notify health care team  if  new concerns arise. In interim   Patient Instructions  treating for UTI   Get urine culture   Begin  Antibiotic . After getting urine culture specimen.   Let us know if not getting better     Standley Brooking. Panosh M.D.  Lab Results  Component Value Date   WBC 6.9 12/29/2018   HGB 14.1 12/29/2018   HCT 41.4 12/29/2018   PLT 287.0 12/29/2018   GLUCOSE 88 12/29/2018   CHOL 195 12/29/2018   TRIG 202.0 (H) 12/29/2018   HDL 53.50 12/29/2018   LDLDIRECT 107.0 12/29/2018   LDLCALC 83 11/19/2016   ALT 14 12/29/2018   AST 16 12/29/2018   NA 142 12/29/2018   K 3.9 12/29/2018   CL 105 12/29/2018   CREATININE 0.72 12/29/2018   BUN 16 12/29/2018   CO2 28 12/29/2018   TSH 0.19 (L) 12/29/2018   INR 1.0 ratio  11/27/2010

## 2019-11-13 LAB — URINE CULTURE
MICRO NUMBER:: 1124267
SPECIMEN QUALITY:: ADEQUATE

## 2019-11-14 NOTE — Progress Notes (Signed)
Tell patient that urine culture shows e coli  sensitive to medication given . Should resolve with current treatment .FU if not better. 

## 2019-11-22 DIAGNOSIS — H43813 Vitreous degeneration, bilateral: Secondary | ICD-10-CM | POA: Diagnosis not present

## 2019-11-22 DIAGNOSIS — H353112 Nonexudative age-related macular degeneration, right eye, intermediate dry stage: Secondary | ICD-10-CM | POA: Diagnosis not present

## 2019-11-22 DIAGNOSIS — H353221 Exudative age-related macular degeneration, left eye, with active choroidal neovascularization: Secondary | ICD-10-CM | POA: Diagnosis not present

## 2019-11-22 DIAGNOSIS — H43821 Vitreomacular adhesion, right eye: Secondary | ICD-10-CM | POA: Diagnosis not present

## 2019-12-01 DIAGNOSIS — M25552 Pain in left hip: Secondary | ICD-10-CM | POA: Diagnosis not present

## 2019-12-01 DIAGNOSIS — M47816 Spondylosis without myelopathy or radiculopathy, lumbar region: Secondary | ICD-10-CM | POA: Diagnosis not present

## 2019-12-28 ENCOUNTER — Other Ambulatory Visit (INDEPENDENT_AMBULATORY_CARE_PROVIDER_SITE_OTHER): Payer: Medicare Other

## 2019-12-28 ENCOUNTER — Other Ambulatory Visit: Payer: Self-pay

## 2019-12-28 DIAGNOSIS — E039 Hypothyroidism, unspecified: Secondary | ICD-10-CM

## 2019-12-28 DIAGNOSIS — E782 Mixed hyperlipidemia: Secondary | ICD-10-CM

## 2019-12-28 DIAGNOSIS — Z79899 Other long term (current) drug therapy: Secondary | ICD-10-CM

## 2019-12-28 DIAGNOSIS — M8580 Other specified disorders of bone density and structure, unspecified site: Secondary | ICD-10-CM | POA: Diagnosis not present

## 2019-12-28 LAB — CBC WITH DIFFERENTIAL/PLATELET
Basophils Absolute: 0 10*3/uL (ref 0.0–0.1)
Basophils Relative: 0.9 % (ref 0.0–3.0)
Eosinophils Absolute: 0.1 10*3/uL (ref 0.0–0.7)
Eosinophils Relative: 2.7 % (ref 0.0–5.0)
HCT: 40.3 % (ref 36.0–46.0)
Hemoglobin: 13.7 g/dL (ref 12.0–15.0)
Lymphocytes Relative: 36 % (ref 12.0–46.0)
Lymphs Abs: 1.9 10*3/uL (ref 0.7–4.0)
MCHC: 34.1 g/dL (ref 30.0–36.0)
MCV: 94.6 fl (ref 78.0–100.0)
Monocytes Absolute: 0.5 10*3/uL (ref 0.1–1.0)
Monocytes Relative: 8.8 % (ref 3.0–12.0)
Neutro Abs: 2.8 10*3/uL (ref 1.4–7.7)
Neutrophils Relative %: 51.6 % (ref 43.0–77.0)
Platelets: 260 10*3/uL (ref 150.0–400.0)
RBC: 4.26 Mil/uL (ref 3.87–5.11)
RDW: 14.1 % (ref 11.5–15.5)
WBC: 5.4 10*3/uL (ref 4.0–10.5)

## 2019-12-28 LAB — BASIC METABOLIC PANEL
BUN: 16 mg/dL (ref 6–23)
CO2: 28 mEq/L (ref 19–32)
Calcium: 8.8 mg/dL (ref 8.4–10.5)
Chloride: 106 mEq/L (ref 96–112)
Creatinine, Ser: 0.77 mg/dL (ref 0.40–1.20)
GFR: 72.03 mL/min (ref >60.00)
Glucose, Bld: 99 mg/dL (ref 70–99)
Potassium: 4.3 mEq/L (ref 3.5–5.1)
Sodium: 141 mEq/L (ref 135–145)

## 2019-12-28 LAB — LIPID PANEL
Cholesterol: 190 mg/dL (ref 0–200)
HDL: 48.9 mg/dL (ref >39.00)
NonHDL: 141.11
Total CHOL/HDL Ratio: 4
Triglycerides: 260 mg/dL — ABNORMAL HIGH (ref 0.0–149.0)
VLDL: 52 mg/dL — ABNORMAL HIGH (ref 0.0–40.0)

## 2019-12-28 LAB — HEPATIC FUNCTION PANEL
ALT: 13 U/L (ref 0–35)
AST: 16 U/L (ref 0–37)
Albumin: 4.1 g/dL (ref 3.5–5.2)
Alkaline Phosphatase: 82 U/L (ref 39–117)
Bilirubin, Direct: 0.1 mg/dL (ref 0.0–0.3)
Total Bilirubin: 0.4 mg/dL (ref 0.2–1.2)
Total Protein: 6 g/dL (ref 6.0–8.3)

## 2019-12-28 LAB — T4, FREE: Free T4: 1.07 ng/dL (ref 0.60–1.60)

## 2019-12-28 LAB — TSH: TSH: 0.63 u[IU]/mL (ref 0.35–4.50)

## 2019-12-28 LAB — LDL CHOLESTEROL, DIRECT: Direct LDL: 103 mg/dL

## 2020-01-02 ENCOUNTER — Telehealth: Payer: Self-pay | Admitting: Internal Medicine

## 2020-01-02 NOTE — Telephone Encounter (Signed)
Pt given lab results per notes of Dr Regis Bill on 12/29/19. Pt verbalized understanding.

## 2020-01-02 NOTE — Telephone Encounter (Signed)
Pt returned vm for lab results. NT unavailable.  Copied from [place] [phone]. Topic: Quick Communication - Lab Results (Clinic Use ONLY) >> Dec 30, 2019 10:27 AM Grayling Congress, [place] wrote: Called patient to inform them of 12/28/2019 lab results. When patient returns call, triage nurse may disclose results.

## 2020-01-03 ENCOUNTER — Other Ambulatory Visit: Payer: Self-pay

## 2020-01-04 ENCOUNTER — Other Ambulatory Visit: Payer: Self-pay | Admitting: Internal Medicine

## 2020-01-04 ENCOUNTER — Ambulatory Visit (INDEPENDENT_AMBULATORY_CARE_PROVIDER_SITE_OTHER): Payer: Medicare Other | Admitting: Internal Medicine

## 2020-01-04 ENCOUNTER — Encounter: Payer: Self-pay | Admitting: Internal Medicine

## 2020-01-04 VITALS — BP 126/62 | HR 75 | Temp 97.9°F | Ht 62.0 in

## 2020-01-04 DIAGNOSIS — Z79899 Other long term (current) drug therapy: Secondary | ICD-10-CM

## 2020-01-04 DIAGNOSIS — Z0000 Encounter for general adult medical examination without abnormal findings: Secondary | ICD-10-CM | POA: Diagnosis not present

## 2020-01-04 DIAGNOSIS — E782 Mixed hyperlipidemia: Secondary | ICD-10-CM | POA: Diagnosis not present

## 2020-01-04 DIAGNOSIS — N6459 Other signs and symptoms in breast: Secondary | ICD-10-CM

## 2020-01-04 DIAGNOSIS — M8580 Other specified disorders of bone density and structure, unspecified site: Secondary | ICD-10-CM | POA: Diagnosis not present

## 2020-01-04 DIAGNOSIS — E039 Hypothyroidism, unspecified: Secondary | ICD-10-CM | POA: Diagnosis not present

## 2020-01-04 MED ORDER — SYNTHROID 100 MCG PO TABS: 100.0000 ug | ORAL_TABLET | Freq: Every day | ORAL | 3 refills | Status: DC

## 2020-01-04 NOTE — Patient Instructions (Addendum)
No change in medication  Triglycerides   Can come down with exercise and limiting simple carbohydrates sugars processed foods and  Limiting animal fats.    If breast  Sx are  persistent or progressive let me know but I am not alarmed and exam is good today .    If al  [facility] then CPX and labs in a year .     High Triglycerides Eating Plan Triglycerides are a type of fat in the blood. High levels of triglycerides can increase your risk of heart disease and stroke. If your triglyceride levels are high, choosing the right foods can help lower your triglycerides and keep your heart healthy. Work with your health care provider or a diet and nutrition specialist (dietitian) to develop an eating plan that is right for you. What are tips for following this plan? General guidelines   Lose weight, if you are overweight. For most people, losing 5-10 lbs (2-5 kg) helps lower triglyceride levels. A weight-loss plan may include. ? 30 minutes of exercise at least 5 days a week. ? Reducing the amount of calories, sugar, and fat you eat.  Eat a wide variety of fresh fruits, vegetables, and whole grains. These foods are high in fiber.  Eat foods that contain healthy fats, such as fatty fish, nuts, seeds, and olive oil.  Avoid foods that are high in added sugar, added salt (sodium), saturated fat, and trans fat.  Avoid low-fiber, refined carbohydrates such as white bread, crackers, noodles, and white rice.  Avoid foods with partially hydrogenated oils (trans fats), such as fried foods or stick margarine.  Limit alcohol intake to no more than 1 drink a day for nonpregnant women and 2 drinks a day for men. One drink equals 12 oz of beer, 5 oz of wine, or 1 oz of hard liquor. Your health care provider may recommend that you drink less depending on your overall health. Reading food labels  Check food labels for the amount of saturated fat. Choose foods with no or very little saturated fat.  Check food  labels for the amount of trans fat. Choose foods with no trans fat.  Check food labels for the amount of cholesterol. Choose foods low in cholesterol. Ask your dietitian how much cholesterol you should have each day.  Check food labels for the amount of sodium. Choose foods with less than 140 milligrams (mg) per serving. Shopping  Buy dairy products labeled as nonfat (skim) or low-fat (1%).  Avoid buying processed or prepackaged foods. These are often high in added sugar, sodium, and fat. Cooking  Choose healthy fats when cooking, such as olive oil or canola oil.  Cook foods using lower fat methods, such as baking, broiling, boiling, or grilling.  Make your own sauces, dressings, and marinades when possible, instead of buying them. Store-bought sauces, dressings, and marinades are often high in sodium and sugar. Meal planning  Eat more home-cooked food and less restaurant, buffet, and fast food.  Eat fatty fish at least 2 times each week. Examples of fatty fish include salmon, [place], mackerel, tuna, and herring.  If you eat whole eggs, do not eat more than 3 egg yolks per week. What foods are recommended? The items listed may not be a complete list. Talk with your dietitian about what dietary choices are best for you. Grains Whole wheat or whole grain breads, crackers, cereals, and pasta. Unsweetened oatmeal. Bulgur. Barley. Quinoa. Brown rice. Whole wheat flour tortillas. Vegetables Fresh or frozen vegetables. Low-sodium  canned vegetables. Fruits All fresh, canned (in natural juice), or frozen fruits. Meats and other protein foods Skinless chicken or [place]. Ground chicken or [place]. Lean cuts of pork, trimmed of fat. Fish and seafood, especially salmon, [place], and herring. Egg whites. Dried beans, peas, or lentils. Unsalted nuts or seeds. Unsalted canned beans. Natural peanut or almond butter. Dairy Low-fat dairy products. Skim or low-fat (1%) milk. Reduced fat (2%) and  low-sodium cheese. Low-fat ricotta cheese. Low-fat cottage cheese. Plain, low-fat yogurt. Fats and oils Tub margarine without trans fats. Light or reduced-fat mayonnaise. Light or reduced-fat salad dressings. Avocado. Safflower, olive, sunflower, soybean, and canola oils. What foods are not recommended? The items listed may not be a complete list. Talk with your dietitian about what dietary choices are best for you. Grains White bread. White (regular) pasta. White rice. Cornbread. Bagels. Pastries. Crackers that contain trans fat. Vegetables Creamed or fried vegetables. Vegetables in a cheese sauce. Fruits Sweetened dried fruit. Canned fruit in syrup. Fruit juice. Meats and other protein foods Fatty cuts of meat. Ribs. Chicken wings. Berniece Salines. Sausage. Bologna. Salami. Chitterlings. Fatback. Hot dogs. Bratwurst. Packaged lunch meats. Dairy Whole or reduced-fat (2%) milk. Half-and-half. Cream cheese. Full-fat or sweetened yogurt. Full-fat cheese. Nondairy creamers. Whipped toppings. Processed cheese or cheese spreads. Cheese curds. Beverages Alcohol. Sweetened drinks, such as soda, lemonade, fruit drinks, or punches. Fats and oils Butter. Stick margarine. Lard. Shortening. Ghee. Bacon fat. Tropical oils, such as coconut, palm kernel, or palm oils. Sweets and desserts Corn syrup. Sugars. Honey. Molasses. Candy. Jam and jelly. Syrup. Sweetened cereals. Cookies. Pies. Cakes. Donuts. Muffins. Ice cream. Condiments Store-bought sauces, dressings, and marinades that are high in sugar, such as ketchup and barbecue sauce. Summary  High levels of triglycerides can increase the risk of heart disease and stroke. Choosing the right foods can help lower your triglycerides.  Eat plenty of fresh fruits, vegetables, and whole grains. Choose low-fat dairy and lean meats. Eat fatty fish at least twice a week.  Avoid processed and prepackaged foods with added sugar, sodium, saturated fat, and trans fat.  If  you need suggestions or have questions about what types of food are good for you, talk with your health care provider or a dietitian. This information is not intended to replace advice given to you by your health care provider. Make sure you discuss any questions you have with your health care provider. Document Revised: 11/20/2017 Document Reviewed: 02/10/2017 Elsevier Patient Education  2020 [employer].

## 2020-01-04 NOTE — Progress Notes (Signed)
Chief Complaint  Patient presents with  . Annual Exam    Pt states she had pressure in her left boob she wants looked at    HPI: Christina Myers 79 y.o. comes in today for Preventive Medicare exam/ and Chronic disease management Thyroid  No change in meds Statin med almost out no se of med noted  Had time waxing waxing  now gon of localized pressure feeling left breast   Not radiation and no assoc sob cough fever  NV  And now better  Mammogram due in March   No UTI sx   Has appt to get covid vaccine  Friday   Husband had recent pna getting better but covid negative   Health Maintenance  Topic Date Due  . TETANUS/TDAP  12/22/2018  . INFLUENZA VACCINE  Completed  . DEXA SCAN  Completed  . PNA vac Low Risk Adult  Completed   Health Maintenance Review LIFESTYLE:  Exercise:  Dance teaching  Tobacco/ETS: no  Alcohol:  Never  Seldom.  Sugar beverages: Sleep: about 8  Drug use: no HH: 2    Hearing:  Good   Vision:  No limitations at present . Last eye check UTD wet amd left eye 12 weeks    Safety:  Has smoke detector and wears seat belts.   No excess sun exposure. Sees dentist regularly.  Falls:  no  Memory: Felt to be good  , no concern from her or her family.  Depression: No anhedonia unusual crying or depressive symptoms  Nutrition: Eats well balanced diet; adequate calcium and vitamin D. No swallowing chewing problems.  Injury: no major injuries in the last six months.  Other healthcare providers:  Reviewed today .  Preventive parameters: u  Reviewed   ADLS:   There are no problems or need for assistance  driving, feeding, obtaining food, dressing, toileting and bathing, managing money using phone. She is independent.    ROS:   See hpi  REST of 12 system review negative except as per HPI   Past Medical History:  Diagnosis Date  . COLONIC POLYPS, HX OF 08/30/2007   Qualifier: Diagnosis of  By: Rogue Bussing CMA, Maryann Alar    . Hx of colonic polyps   .  Hypertension   . Hypothyroidism   . OSTEOARTHRITIS, HAND 04/24/2009   Qualifier: Diagnosis of  By: Regis Bill MD, Standley Brooking   . PVC (premature ventricular contraction)    [facility] evaluation 7/03  . Seasonal allergies   . Skin cancer    hx of bcca of face  . SKIN CANCER, HX OF 02/02/2008   Qualifier: Diagnosis of  By: Regis Bill MD, Standley Brooking     Family History  Problem Relation Age of Onset  . Cancer Other        colon, prostate   . Diabetes Other   . Stroke Other   . Heart disease Other   . Colon cancer Mother       Outpatient Encounter Medications as of 01/04/2020  Medication Sig  . aspirin 81 MG tablet Take 81 mg by mouth daily.    . Cholecalciferol (VITAMIN D3) 2000 units TABS Take 2,000 Units by mouth daily.  . diclofenac sodium (VOLTAREN) 1 % GEL Apply 4 g topically 4 (four) times daily.  . Eluxadoline (VIBERZI) 75 MG TABS Take by mouth as needed.  [name] estradiol (ESTRACE) 0.1 MG/GM vaginal cream Place 2 g vaginally daily.    . simvastatin (ZOCOR) 20 MG tablet TAKE 1 TABLET(20  MG) BY MOUTH AT BEDTIME  . SYNTHROID 100 MCG tablet Take 1 tablet (100 mcg total) by mouth daily.  [name] tobramycin (TOBREX) 0.3 % ophthalmic solution   . [DISCONTINUED] SYNTHROID 100 MCG tablet TAKE 1 TABLET BY MOUTH DAILY  . BIOTIN PO Take by mouth.  . [DISCONTINUED] sulfamethoxazole-trimethoprim (BACTRIM DS) 800-160 MG tablet Take 1 tablet by mouth 2 (two) times daily. (Patient not taking: Reported on 01/04/2020)   No facility-administered encounter medications on file as of 01/04/2020.    EXAM:  BP 126/62 (BP Location: Right Arm, Patient Position: Sitting, Cuff Size: Normal)   Pulse 75   Temp 97.9 F (36.6 C) (Temporal)   Ht 5\' 2"  (1.575 m)   SpO2 97%   BMI 23.78 kg/m   Body mass index is 23.78 kg/m.  Physical Exam: Vital signs reviewed [id number] is a well-developed well-nourished alert cooperative   who appears younger than  stated age in no acute distress.  HEENT: normocephalic atraumatic , Eyes:  PERRL EOM's full, conjunctiva clear, ., Ears: no deformity EAC's clear TMs with normal landmarks. Mouth: , masked NECK: supple without masses, thyromegaly or bruits. CHEST/PULM:  Clear to auscultation and percussion breath sounds equal no wheeze , rales or rhonchi. No chest wall deformities or tenderness. Breast: normal by inspection . No dimpling, discharge, masses, tenderness or discharge .point to area around  Mid breast  Small area    No masses  CV: PMI is nondisplaced, S1 S2 no gallops, murmurs, rubs. Peripheral pulses are l without delay.No JVD .  ABDOMEN: Bowel sounds normal nontender  No guard or rebound, no hepato splenomegal no CVA tenderness.   Extremtities:  No clubbing cyanosis or edema, no acute joint swelling or redness no focal atrophy NEURO:  Oriented x3, cranial nerves 3-12 appear to be intact, no obvious focal weakness,gait within normal limits  SKIN: No acute rashes normal turgor, color, no bruising or petechiae. PSYCH: Oriented, good eye contact, no obvious depression anxiety, cognition and judgment appear normal. LN: no cervical axillary  adenopathy No noted deficits in memory, attention, and speech.   Lab Results  Component Value Date   WBC 5.4 12/28/2019   HGB 13.7 12/28/2019   HCT 40.3 12/28/2019   PLT 260.0 12/28/2019   GLUCOSE 99 12/28/2019   CHOL 190 12/28/2019   TRIG 260.0 (H) 12/28/2019   HDL 48.90 12/28/2019   LDLDIRECT 103.0 12/28/2019   LDLCALC 83 11/19/2016   ALT 13 12/28/2019   AST 16 12/28/2019   NA 141 12/28/2019   K 4.3 12/28/2019   CL 106 12/28/2019   CREATININE 0.77 12/28/2019   BUN 16 12/28/2019   CO2 28 12/28/2019   TSH 0.63 12/28/2019   INR 1.0 ratio 11/27/2010    ASSESSMENT AND PLAN:  Discussed the following assessment and plan:  Visit for preventive health examination  Medication management  Hypothyroidism, unspecified type  HYPERLIPIDEMIA - disc elevaetd tg  and diet changes that can help   Osteopenia, unspecified  location  Breast symptom - resolved see text She is well today  Do not think the breast sx are alarming and no assoc sx but if  persistent or progressive she will contact us for  Fu consider dx Mammo  . don't   A cv cause either    Exam is good today  Patient Care Team: Hanaa Payes, Standley Brooking, MD as PCP - General Danella Sensing, MD (Dermatology) Richmond Campbell, MD (Gastroenterology) Elsie Saas, MD (Orthopedic Surgery) Sherlynn Stalls, MD as Consulting Physician (Ophthalmology) [place],  Heather, OD (Optometry)  Patient Instructions  No change in medication  Triglycerides   Can come down with exercise and limiting simple carbohydrates sugars processed foods and  Limiting animal fats.    If breast  Sx are  persistent or progressive let me know but I am not alarmed and exam is good today .    If al  [facility] then CPX and labs in a year .     High Triglycerides Eating Plan Triglycerides are a type of fat in the blood. High levels of triglycerides can increase your risk of heart disease and stroke. If your triglyceride levels are high, choosing the right foods can help lower your triglycerides and keep your heart healthy. Work with your health care provider or a diet and nutrition specialist (dietitian) to develop an eating plan that is right for you. What are tips for following this plan? General guidelines   Lose weight, if you are overweight. For most people, losing 5-10 lbs (2-5 kg) helps lower triglyceride levels. A weight-loss plan may include. ? 30 minutes of exercise at least 5 days a week. ? Reducing the amount of calories, sugar, and fat you eat.  Eat a wide variety of fresh fruits, vegetables, and whole grains. These foods are high in fiber.  Eat foods that contain healthy fats, such as fatty fish, nuts, seeds, and olive oil.  Avoid foods that are high in added sugar, added salt (sodium), saturated fat, and trans fat.  Avoid low-fiber, refined carbohydrates such as white bread,  crackers, noodles, and white rice.  Avoid foods with partially hydrogenated oils (trans fats), such as fried foods or stick margarine.  Limit alcohol intake to no more than 1 drink a day for nonpregnant women and 2 drinks a day for men. One drink equals 12 oz of beer, 5 oz of wine, or 1 oz of hard liquor. Your health care provider may recommend that you drink less depending on your overall health. Reading food labels  Check food labels for the amount of saturated fat. Choose foods with no or very little saturated fat.  Check food labels for the amount of trans fat. Choose foods with no trans fat.  Check food labels for the amount of cholesterol. Choose foods low in cholesterol. Ask your dietitian how much cholesterol you should have each day.  Check food labels for the amount of sodium. Choose foods with less than 140 milligrams (mg) per serving. Shopping  Buy dairy products labeled as nonfat (skim) or low-fat (1%).  Avoid buying processed or prepackaged foods. These are often high in added sugar, sodium, and fat. Cooking  Choose healthy fats when cooking, such as olive oil or canola oil.  Cook foods using lower fat methods, such as baking, broiling, boiling, or grilling.  Make your own sauces, dressings, and marinades when possible, instead of buying them. Store-bought sauces, dressings, and marinades are often high in sodium and sugar. Meal planning  Eat more home-cooked food and less restaurant, buffet, and fast food.  Eat fatty fish at least 2 times each week. Examples of fatty fish include salmon, Petti, mackerel, tuna, and herring.  If you eat whole eggs, do not eat more than 3 egg yolks per week. What foods are recommended? The items listed may not be a complete list. Talk with your dietitian about what dietary choices are best for you. Grains Whole wheat or whole grain breads, crackers, cereals, and pasta. Unsweetened oatmeal. Bulgur. Barley. Quinoa. Brown rice. Whole  wheat  flour tortillas. Vegetables Fresh or frozen vegetables. Low-sodium canned vegetables. Fruits All fresh, canned (in natural juice), or frozen fruits. Meats and other protein foods Skinless chicken or [place]. Ground chicken or [place]. Lean cuts of pork, trimmed of fat. Fish and seafood, especially salmon, Nicolini, and herring. Egg whites. Dried beans, peas, or lentils. Unsalted nuts or seeds. Unsalted canned beans. Natural peanut or almond butter. Dairy Low-fat dairy products. Skim or low-fat (1%) milk. Reduced fat (2%) and low-sodium cheese. Low-fat ricotta cheese. Low-fat cottage cheese. Plain, low-fat yogurt. Fats and oils Tub margarine without trans fats. Light or reduced-fat mayonnaise. Light or reduced-fat salad dressings. Avocado. Safflower, olive, sunflower, soybean, and canola oils. What foods are not recommended? The items listed may not be a complete list. Talk with your dietitian about what dietary choices are best for you. Grains White bread. White (regular) pasta. White rice. Cornbread. Bagels. Pastries. Crackers that contain trans fat. Vegetables Creamed or fried vegetables. Vegetables in a cheese sauce. Fruits Sweetened dried fruit. Canned fruit in syrup. Fruit juice. Meats and other protein foods Fatty cuts of meat. Ribs. Chicken wings. Berniece Salines. Sausage. Bologna. Salami. Chitterlings. Fatback. Hot dogs. Bratwurst. Packaged lunch meats. Dairy Whole or reduced-fat (2%) milk. Half-and-half. Cream cheese. Full-fat or sweetened yogurt. Full-fat cheese. Nondairy creamers. Whipped toppings. Processed cheese or cheese spreads. Cheese curds. Beverages Alcohol. Sweetened drinks, such as soda, lemonade, fruit drinks, or punches. Fats and oils Butter. Stick margarine. Lard. Shortening. Ghee. Bacon fat. Tropical oils, such as coconut, palm kernel, or palm oils. Sweets and desserts Corn syrup. Sugars. Honey. Molasses. Candy. Jam and jelly. Syrup. Sweetened cereals. Cookies. Pies.  Cakes. Donuts. Muffins. Ice cream. Condiments Store-bought sauces, dressings, and marinades that are high in sugar, such as ketchup and barbecue sauce. Summary  High levels of triglycerides can increase the risk of heart disease and stroke. Choosing the right foods can help lower your triglycerides.  Eat plenty of fresh fruits, vegetables, and whole grains. Choose low-fat dairy and lean meats. Eat fatty fish at least twice a week.  Avoid processed and prepackaged foods with added sugar, sodium, saturated fat, and trans fat.  If you need suggestions or have questions about what types of food are good for you, talk with your health care provider or a dietitian. This information is not intended to replace advice given to you by your health care provider. Make sure you discuss any questions you have with your health care provider. Document Revised: 11/20/2017 Document Reviewed: 02/10/2017 Elsevier Patient Education  2020 [place] Lucindia Lemley M.D.

## 2020-01-06 ENCOUNTER — Ambulatory Visit: Payer: Medicare Other | Attending: Internal Medicine

## 2020-01-06 DIAGNOSIS — Z23 Encounter for immunization: Secondary | ICD-10-CM | POA: Insufficient documentation

## 2020-01-06 NOTE — Progress Notes (Signed)
   Covid-19 Vaccination Clinic  Name:  Christina Myers    MRN: [id number] DOB: 04/11/1941  01/06/2020  [name] was observed post Covid-19 immunization for 15 minutes without incidence. She was provided with Vaccine Information Sheet and instruction to access the V-Safe system.   [name] was instructed to call 911 with any severe reactions post vaccine: [name] Difficulty breathing  . Swelling of your face and throat  . A fast heartbeat  . A bad rash all over your body  . Dizziness and weakness    Immunizations Administered    Name Date Dose VIS Date Route   Pfizer COVID-19 Vaccine 01/06/2020 10:21 AM 0.3 mL 12/02/2019 Intramuscular   Manufacturer: [place]   Lot: [id number]   [place]: [id number]

## 2020-01-27 ENCOUNTER — Ambulatory Visit: Payer: Medicare Other | Attending: Internal Medicine

## 2020-01-27 DIAGNOSIS — Z23 Encounter for immunization: Secondary | ICD-10-CM

## 2020-01-27 NOTE — Progress Notes (Signed)
   Covid-19 Vaccination Clinic  Name:  Christina Myers    MRN: [id number] DOB: August 23, 1941  01/27/2020  Christina Myers was observed post Covid-19 immunization for 15 minutes without incidence. She was provided with Vaccine Information Sheet and instruction to access the V-Safe system.   Christina Myers was instructed to call 911 with any severe reactions post vaccine: [name] Difficulty breathing  . Swelling of your face and throat  . A fast heartbeat  . A bad rash all over your body  . Dizziness and weakness    Immunizations Administered    Name Date Dose VIS Date Route   Pfizer COVID-19 Vaccine 01/27/2020 12:13 PM 0.3 mL 12/02/2019 Intramuscular   Manufacturer: [place]   Lot: [id number]   [place]: [id number]

## 2020-01-31 DIAGNOSIS — M25552 Pain in left hip: Secondary | ICD-10-CM | POA: Diagnosis not present

## 2020-01-31 DIAGNOSIS — M5416 Radiculopathy, lumbar region: Secondary | ICD-10-CM | POA: Diagnosis not present

## 2020-02-07 DIAGNOSIS — M5416 Radiculopathy, lumbar region: Secondary | ICD-10-CM | POA: Diagnosis not present

## 2020-02-07 DIAGNOSIS — M25552 Pain in left hip: Secondary | ICD-10-CM | POA: Diagnosis not present

## 2020-02-10 DIAGNOSIS — M545 Low back pain: Secondary | ICD-10-CM | POA: Diagnosis not present

## 2020-02-10 DIAGNOSIS — M1612 Unilateral primary osteoarthritis, left hip: Secondary | ICD-10-CM | POA: Diagnosis not present

## 2020-02-13 ENCOUNTER — Other Ambulatory Visit (INDEPENDENT_AMBULATORY_CARE_PROVIDER_SITE_OTHER): Payer: Medicare Other

## 2020-02-13 ENCOUNTER — Other Ambulatory Visit: Payer: Self-pay

## 2020-02-13 ENCOUNTER — Telehealth (INDEPENDENT_AMBULATORY_CARE_PROVIDER_SITE_OTHER): Payer: Medicare Other | Admitting: Internal Medicine

## 2020-02-13 ENCOUNTER — Encounter: Payer: Self-pay | Admitting: Internal Medicine

## 2020-02-13 DIAGNOSIS — Z79899 Other long term (current) drug therapy: Secondary | ICD-10-CM | POA: Diagnosis not present

## 2020-02-13 DIAGNOSIS — R300 Dysuria: Secondary | ICD-10-CM

## 2020-02-13 DIAGNOSIS — R3989 Other symptoms and signs involving the genitourinary system: Secondary | ICD-10-CM

## 2020-02-13 DIAGNOSIS — E2839 Other primary ovarian failure: Secondary | ICD-10-CM

## 2020-02-13 DIAGNOSIS — Z87440 Personal history of urinary (tract) infections: Secondary | ICD-10-CM

## 2020-02-13 LAB — URINALYSIS, ROUTINE W REFLEX MICROSCOPIC
Bilirubin Urine: NEGATIVE
Ketones, ur: NEGATIVE
Nitrite: NEGATIVE
Specific Gravity, Urine: 1.025 (ref 1.000–1.030)
Urine Glucose: NEGATIVE
Urobilinogen, UA: 0.2 (ref 0.0–1.0)
pH: 6 (ref 5.0–8.0)

## 2020-02-13 MED ORDER — ESTRADIOL 0.1 MG/GM VA CREA: TOPICAL_CREAM | VAGINAL | 3 refills | Status: AC

## 2020-02-13 MED ORDER — SULFAMETHOXAZOLE-TRIMETHOPRIM 400-80 MG PO TABS: 1.0000 | ORAL_TABLET | Freq: Two times a day (BID) | ORAL | 0 refills | Status: AC

## 2020-02-13 NOTE — Progress Notes (Signed)
Virtual Visit via Telephone Note  I connected with@ on 02/13/20 at  3:00 PM EST by telephone and verified that I am speaking with the correct person using two identifiers.   I discussed the limitations, risks, security and privacy concerns of performing an evaluation and management service by telephone and the limited availability of in person appointments. tThere may be a patient responsible charge related to this service. The patient expressed understanding and agreed to proceed.  Location patient: home Location provider: work office Participants present for the call: patient, provider Patient did not have a visit in the prior 7 days to address this/these issue(s).   History of Present Illness: Christina Myers  Present with uti sx  Over the weekend  ie 3 days   Better today  Dysuria and frequency urgency  No fever chills go assoc sx .  No flank pain  Using fluids and cranberry pills Going out of town this weekend [place] for 4 days  and would like to be  Better by then .  Used to get vag est crema from [facility] but out and nonrecnetly using  otc vaginal moisturizer    Observations/Objective: Patient sounds personable and well on the phone. I do not appreciate any SOB. Speech and thought processing are grossly intact. Patient reported vitals: Lab Results  Component Value Date   WBC 5.4 12/28/2019   HGB 13.7 12/28/2019   HCT 40.3 12/28/2019   PLT 260.0 12/28/2019   GLUCOSE 99 12/28/2019   CHOL 190 12/28/2019   TRIG 260.0 (H) 12/28/2019   HDL 48.90 12/28/2019   LDLDIRECT 103.0 12/28/2019   LDLCALC 83 11/19/2016   ALT 13 12/28/2019   AST 16 12/28/2019   NA 141 12/28/2019   K 4.3 12/28/2019   CL 106 12/28/2019   CREATININE 0.77 12/28/2019   BUN 16 12/28/2019   CO2 28 12/28/2019   TSH 0.63 12/28/2019   INR 1.0 ratio 11/27/2010   Urinalysis    Component Value Date/Time   COLORURINE YELLOW 02/13/2020 1055   APPEARANCEUR Cloudy (A) 02/13/2020 1055   LABSPEC 1.025  02/13/2020 1055   PHURINE 6.0 02/13/2020 1055   GLUCOSEU NEGATIVE 02/13/2020 1055   HGBUR MODERATE (A) 02/13/2020 1055   BILIRUBINUR NEGATIVE 02/13/2020 1055   BILIRUBINUR neg 11/11/2019 1022   KETONESUR NEGATIVE 02/13/2020 1055   PROTEINUR Positive (A) 11/11/2019 1022   UROBILINOGEN 0.2 02/13/2020 1055   NITRITE NEGATIVE 02/13/2020 1055   LEUKOCYTESUR LARGE (A) 02/13/2020 1055     Last uti doc nov 20 e coli rists to  Amp and kelfex  Int augmentin   rx peptra Assessment and Plan: Suspected UTI  History of recurrent UTIs  Estrogen deficiency  Medication management   Follow Up Instructions:  Antibiotic ucx pending    Septra disp 10  Trial of getting back on topical estrogen vaginal   To see if helps prevent UTI  28413 5-10 [id number] 11-20 [id number] 21-30 I did not refer this patient for an OV in the next 24 hours for this/these issue(s).  I discussed the assessment and treatment plan with the patient. The patient was provided an opportunity to ask questions and answered. The patient agreed with the plan and demonstrated an understanding of the instructions.   The patient was advised to call back or seek an in-person evaluation if the symptoms worsen or if the condition fails to improve as anticipated.  I provided 13 minutes of non-face-to-face time during this encounter. Return if symptoms worsen or fail  to improve.  Shanon Ace, MD

## 2020-02-14 DIAGNOSIS — H43813 Vitreous degeneration, bilateral: Secondary | ICD-10-CM | POA: Diagnosis not present

## 2020-02-14 DIAGNOSIS — H353112 Nonexudative age-related macular degeneration, right eye, intermediate dry stage: Secondary | ICD-10-CM | POA: Diagnosis not present

## 2020-02-14 DIAGNOSIS — H353221 Exudative age-related macular degeneration, left eye, with active choroidal neovascularization: Secondary | ICD-10-CM | POA: Diagnosis not present

## 2020-02-14 DIAGNOSIS — H43821 Vitreomacular adhesion, right eye: Secondary | ICD-10-CM | POA: Diagnosis not present

## 2020-02-15 LAB — URINE CULTURE
MICRO NUMBER:: 10172907
SPECIMEN QUALITY:: ADEQUATE

## 2020-02-15 NOTE — Progress Notes (Signed)
Tell patient that urine culture shows e coli  sensitive to medication given . Should resolve with current treatment .FU if not better. 

## 2020-02-16 ENCOUNTER — Telehealth: Payer: Self-pay | Admitting: Internal Medicine

## 2020-02-16 ENCOUNTER — Telehealth (INDEPENDENT_AMBULATORY_CARE_PROVIDER_SITE_OTHER): Payer: Medicare Other | Admitting: Family Medicine

## 2020-02-16 ENCOUNTER — Other Ambulatory Visit: Payer: Self-pay

## 2020-02-16 DIAGNOSIS — R252 Cramp and spasm: Secondary | ICD-10-CM

## 2020-02-16 NOTE — Progress Notes (Addendum)
Virtual Visit via Telephone Note  I connected with Christina Myers on 02/16/20 at  5:40 PM EST by telephone and verified that I am speaking with the correct person using two identifiers.   I discussed the limitations, risks, security and privacy concerns of performing an evaluation and management service by telephone and the availability of in person appointments. I also discussed with the patient that there may be a patient responsible charge related to this service. The patient expressed understanding and agreed to proceed.  Location patient: home Location provider: work or home office Participants present for the call: patient, provider Patient did not have a visit in the prior 7 days to address this/these issue(s).   History of Present Illness:  Acute visit for ? Reaction to abx.  -has taken 3 full days of Bactrim for a mild UTI -reports urinary symptoms have resolved -but, she feels the abx has causes leg cramps - the leg cramps are resolved today -otherwise feels well -denies malaise, rash, NV, fevers, fatigue or any other symptoms -headed to [employer] for a vacation   Observations/Objective: Patient sounds cheerful and well on the phone. I do not appreciate any SOB. Speech and thought processing are grossly intact. Patient reported vitals:  Assessment and Plan:  Leg cramps  -we discussed possible serious and likely etiologies, options for evaluation and workup, limitations of telemedicine visit vs in person visit, treatment, treatment risks and precautions. Pt prefers to treat via telemedicine empirically rather then risking or undertaking an in person visit at this moment. Leg cramps have resolved today, but she wonders if they will recur tonight. Discussed that Bactrim is a strong abx with a number of potential side effects including electrolyte imbalances. She has had 3 full days of bactrim and feels well now with no cramps and urinary symptoms have resolved. IDSA recommends  3 days of Bactrim is adequate for UTI so she could stop it. Given PCP felt longer course was required could switch to another abx. Culture data reviewed. Also reviewed other causes of leg cramps. She has opted to stop abx and does not want to change to another right now. Since leg cramps are resolved right now she opted for no further evaluation at this time and agrees to drink plenty of water. Patient agrees to seek prompt in person care if worsening, new symptoms arise, urinary symptoms or leg cramps return.  Follow Up Instructions:   I did not refer this patient for an OV in the next 24 hours for this/these issue(s).  I discussed the assessment and treatment plan with the patient. The patient was provided an opportunity to ask questions and all were answered. The patient agreed with the plan and demonstrated an understanding of the instructions.   The patient was advised to call back or seek an in-person evaluation if the symptoms worsen or if the condition fails to improve as anticipated.  I provided 21 minutes of non-face-to-face time during this encounter.   Lucretia Kern, DO

## 2020-02-16 NOTE — Telephone Encounter (Signed)
Pt was following up on Christina Myers's call. She was told she was at lunch around noon. Informed pt that today was her half day and apologized for the misinformation. Pt stated that she leaves for vacation at 9:40 am tomorrow an would like a call before then.   Terrible leg cramps since taking antibiotic- worse at night. Pt is wondering if that is a side effect of the medication?   Pt can be reached at [phone]

## 2020-02-16 NOTE — Telephone Encounter (Signed)
Pt is returning your call from earlier this morning. She is requesting to speak to you directly. Thanks

## 2020-02-17 NOTE — Telephone Encounter (Signed)
Pt has been given lab results and had virtual with Dr.Kim

## 2020-02-25 ENCOUNTER — Other Ambulatory Visit: Payer: Self-pay | Admitting: Internal Medicine

## 2020-03-09 DIAGNOSIS — M1612 Unilateral primary osteoarthritis, left hip: Secondary | ICD-10-CM | POA: Diagnosis not present

## 2020-05-08 DIAGNOSIS — H353221 Exudative age-related macular degeneration, left eye, with active choroidal neovascularization: Secondary | ICD-10-CM | POA: Diagnosis not present

## 2020-05-08 DIAGNOSIS — H353112 Nonexudative age-related macular degeneration, right eye, intermediate dry stage: Secondary | ICD-10-CM | POA: Diagnosis not present

## 2020-05-08 DIAGNOSIS — H43821 Vitreomacular adhesion, right eye: Secondary | ICD-10-CM | POA: Diagnosis not present

## 2020-05-08 DIAGNOSIS — H43813 Vitreous degeneration, bilateral: Secondary | ICD-10-CM | POA: Diagnosis not present

## 2020-05-10 ENCOUNTER — Other Ambulatory Visit: Payer: Self-pay | Admitting: Internal Medicine

## 2020-05-10 DIAGNOSIS — Z1231 Encounter for screening mammogram for malignant neoplasm of breast: Secondary | ICD-10-CM

## 2020-06-13 ENCOUNTER — Ambulatory Visit
Admission: RE | Admit: 2020-06-13 | Discharge: 2020-06-13 | Disposition: A | Discharge status: Home / Self Care | Payer: Medicare Other | Source: Ambulatory Visit | Attending: Internal Medicine | Admitting: Internal Medicine

## 2020-06-13 ENCOUNTER — Other Ambulatory Visit: Payer: Self-pay

## 2020-06-13 DIAGNOSIS — Z1231 Encounter for screening mammogram for malignant neoplasm of breast: Secondary | ICD-10-CM

## 2020-06-28 DIAGNOSIS — L821 Other seborrheic keratosis: Secondary | ICD-10-CM | POA: Diagnosis not present

## 2020-06-28 DIAGNOSIS — D692 Other nonthrombocytopenic purpura: Secondary | ICD-10-CM | POA: Diagnosis not present

## 2020-06-28 DIAGNOSIS — Z85828 Personal history of other malignant neoplasm of skin: Secondary | ICD-10-CM | POA: Diagnosis not present

## 2020-06-28 DIAGNOSIS — L570 Actinic keratosis: Secondary | ICD-10-CM | POA: Diagnosis not present

## 2020-08-23 DIAGNOSIS — Z86010 Personal history of colonic polyps: Secondary | ICD-10-CM | POA: Diagnosis not present

## 2020-08-23 DIAGNOSIS — Z800 Family history of malignant neoplasm of digestive organs: Secondary | ICD-10-CM | POA: Diagnosis not present

## 2020-08-23 DIAGNOSIS — Z1211 Encounter for screening for malignant neoplasm of colon: Secondary | ICD-10-CM | POA: Diagnosis not present

## 2020-08-23 DIAGNOSIS — K5730 Diverticulosis of large intestine without perforation or abscess without bleeding: Secondary | ICD-10-CM | POA: Diagnosis not present

## 2020-08-24 DIAGNOSIS — J040 Acute laryngitis: Secondary | ICD-10-CM | POA: Diagnosis not present

## 2020-08-24 DIAGNOSIS — J690 Pneumonitis due to inhalation of food and vomit: Secondary | ICD-10-CM | POA: Diagnosis not present

## 2020-08-24 DIAGNOSIS — J029 Acute pharyngitis, unspecified: Secondary | ICD-10-CM | POA: Diagnosis not present

## 2020-08-24 DIAGNOSIS — J050 Acute obstructive laryngitis [croup]: Secondary | ICD-10-CM | POA: Diagnosis not present

## 2020-09-11 ENCOUNTER — Telehealth: Payer: Self-pay | Admitting: Internal Medicine

## 2020-09-11 DIAGNOSIS — H353112 Nonexudative age-related macular degeneration, right eye, intermediate dry stage: Secondary | ICD-10-CM | POA: Diagnosis not present

## 2020-09-11 DIAGNOSIS — H353222 Exudative age-related macular degeneration, left eye, with inactive choroidal neovascularization: Secondary | ICD-10-CM | POA: Diagnosis not present

## 2020-09-11 DIAGNOSIS — H35371 Puckering of macula, right eye: Secondary | ICD-10-CM | POA: Diagnosis not present

## 2020-09-11 DIAGNOSIS — H43813 Vitreous degeneration, bilateral: Secondary | ICD-10-CM | POA: Diagnosis not present

## 2020-09-11 NOTE — Progress Notes (Signed)
  Chronic Care Management   Outreach Note  09/11/2020 Name: Christina Myers MRN: [id number] DOB: Sep 12, 1941  Referred by: Burnis Medin, MD Reason for referral : No chief complaint on file.   An unsuccessful telephone outreach was attempted today. The patient was referred to the pharmacist for assistance with care management and care coordination.   Follow Up Plan:   Carley Perdue [employer] Scheduler

## 2020-09-12 ENCOUNTER — Telehealth: Payer: Self-pay | Admitting: Internal Medicine

## 2020-09-12 NOTE — Progress Notes (Signed)
  Chronic Care Management   Note  09/12/2020 Name: Christina Myers MRN: [id number] DOB: [birth year]  Christina Myers is a 79 y.o. year old female who is a primary care patient of Panosh, Standley Brooking, MD. I reached out to [employer] by phone today in response to a referral sent by Ms. Vonda Antigua Dickenson's PCP, Panosh, Standley Brooking, MD.   Christina Myers was given information about Chronic Care Management services today including:  1. CCM service includes personalized support from designated clinical staff supervised by her physician, including individualized plan of care and coordination with other care providers 2. 24/7 contact phone numbers for assistance for urgent and routine care needs. 3. Service will only be billed when office clinical staff spend 20 minutes or more in a month to coordinate care. 4. Only one practitioner may furnish and bill the service in a calendar month. 5. The patient may stop CCM services at any time (effective at the end of the month) by phone call to the office staff.   Patient agreed to services and verbal consent obtained.   Follow up plan:   Carley Perdue [employer] Scheduler

## 2020-09-25 ENCOUNTER — Ambulatory Visit: Payer: Self-pay

## 2020-09-25 ENCOUNTER — Ambulatory Visit: Payer: Medicare Other | Attending: Internal Medicine

## 2020-09-25 DIAGNOSIS — Z23 Encounter for immunization: Secondary | ICD-10-CM

## 2020-09-25 NOTE — Progress Notes (Signed)
   Covid-19 Vaccination Clinic  Name:  Lorine Iannaccone    MRN: [id number] DOB: 18-Oct-1941  09/25/2020  Ms. Zou was observed post Covid-19 immunization for 15 minutes without incident. She was provided with Vaccine Information Sheet and instruction to access the V-Safe system.   Ms. Jeancharles was instructed to call 911 with any severe reactions post vaccine: [name] Difficulty breathing  . Swelling of face and throat  . A fast heartbeat  . A bad rash all over body  . Dizziness and weakness

## 2020-10-01 ENCOUNTER — Other Ambulatory Visit: Payer: Self-pay

## 2020-10-01 DIAGNOSIS — Z20822 Contact with and (suspected) exposure to covid-19: Secondary | ICD-10-CM

## 2020-10-02 LAB — SARS-COV-2, NAA 2 DAY TAT

## 2020-10-02 LAB — NOVEL CORONAVIRUS, NAA: SARS-CoV-2, NAA: NOT DETECTED

## 2020-10-17 ENCOUNTER — Other Ambulatory Visit: Payer: Self-pay

## 2020-10-17 ENCOUNTER — Ambulatory Visit (INDEPENDENT_AMBULATORY_CARE_PROVIDER_SITE_OTHER): Payer: Medicare Other

## 2020-10-17 DIAGNOSIS — Z0000 Encounter for general adult medical examination without abnormal findings: Secondary | ICD-10-CM

## 2020-10-17 NOTE — Progress Notes (Addendum)
Virtual Visit via Telephone Note  I connected with  Vonda Antigua Calame on 10/17/20 at  1:00 PM EDT by telephone and verified that I am speaking with the correct person using two identifiers.  Medicare Annual Wellness visit completed telephonically due to Covid-19 pandemic.   Persons participating in this call: This Health Coach and this patient.   Location: Patient: Home Provider: Office   I discussed the limitations, risks, security and privacy concerns of performing an evaluation and management service by telephone and the availability of in person appointments. The patient expressed understanding and agreed to proceed.  Unable to perform video visit due to video visit attempted and failed and/or patient does not have video capability.   Some vital signs may be absent or patient reported.   Willette Brace, LPN    Subjective:   Raygen Linquist is a 79 y.o. female who presents for Medicare Annual (Subsequent) preventive examination.  Review of Systems     Cardiac Risk Factors include: advanced age (>61men, >24 women);dyslipidemia     Objective:    Today's Vitals   10/17/20 1215  PainSc: 4    There is no height or weight on file to calculate BMI.  Advanced Directives 10/17/2020  Does Patient Have a Medical Advance Directive? Yes  Type of Advance Directive Living will    Current Medications (verified) Outpatient Encounter Medications as of 10/17/2020  Medication Sig  . aspirin 81 MG tablet Take 81 mg by mouth daily.    . Cholecalciferol (VITAMIN D3) 2000 units TABS Take 2,000 Units by mouth daily.  . Eluxadoline (VIBERZI) 75 MG TABS Take by mouth as needed.  [name] estradiol (ESTRACE) 0.1 MG/GM vaginal cream 1 -2 gram intravaginally 2  x per week  . simvastatin (ZOCOR) 20 MG tablet TAKE 1 TABLET(20 MG) BY MOUTH AT BEDTIME  . SYNTHROID 100 MCG tablet TAKE 1 TABLET BY MOUTH DAILY  . BIOTIN PO Take by mouth. (Patient not taking: Reported on 10/17/2020)  . diclofenac  sodium (VOLTAREN) 1 % GEL Apply 4 g topically 4 (four) times daily. (Patient not taking: Reported on 10/17/2020)  . tobramycin (TOBREX) 0.3 % ophthalmic solution  (Patient not taking: Reported on 10/17/2020)   No facility-administered encounter medications on file as of 10/17/2020.    Allergies (verified) Amoxicillin-pot clavulanate and Cefdinir   History: Past Medical History:  Diagnosis Date  . COLONIC POLYPS, HX OF 08/30/2007   Qualifier: Diagnosis of  By: Rogue Bussing CMA, Maryann Alar    . Hx of colonic polyps   . Hypertension   . Hypothyroidism   . OSTEOARTHRITIS, HAND 04/24/2009   Qualifier: Diagnosis of  By: Regis Bill MD, Standley Brooking   . PVC (premature ventricular contraction)    [facility] evaluation 7/03  . Seasonal allergies   . Skin cancer    hx of bcca of face  . SKIN CANCER, HX OF 02/02/2008   Qualifier: Diagnosis of  By: Regis Bill MD, Standley Brooking    Past Surgical History:  Procedure Laterality Date  . FACIAL COSMETIC SURGERY    . TONSILLECTOMY    . tubal lig     Family History  Problem Relation Age of Onset  . Cancer Other        colon, prostate   . Diabetes Other   . Stroke Other   . Heart disease Other   . Colon cancer Mother    Social History   Socioeconomic History  . Marital status: Married    Spouse name: Not on file  .  Number of children: Not on file  . Years of education: Not on file  . Highest education level: Not on file  Occupational History  . Not on file  Tobacco Use  . Smoking status: Former Research scientist (life sciences)  . Smokeless tobacco: Never Used  Substance and Sexual Activity  . Alcohol use: Yes    Comment: rarely  . Drug use: No  . Sexual activity: Not on file  Other Topics Concern  . Not on file  Social History Narrative   Married    Former smoker   Set designer and teaches tap dancing.    No etoh   HH of 2    Social Determinants of Health   Financial Resource Strain: Low Risk   . Difficulty of Paying Living Expenses: Not hard at all  Food Insecurity: No  Food Insecurity  . Worried About Charity fundraiser in the Last Year: Never true  . Ran Out of Food in the Last Year: Never true  Transportation Needs: No Transportation Needs  . Lack of Transportation (Medical): No  . Lack of Transportation (Non-Medical): No  Physical Activity: Inactive  . Days of Exercise per Week: 0 days  . Minutes of Exercise per Session: 0 min  Stress: No Stress Concern Present  . Feeling of Stress : Not at all  Social Connections: Socially Integrated  . Frequency of Communication with Friends and Family: More than three times a week  . Frequency of Social Gatherings with Friends and Family: More than three times a week  . Attends Religious Services: More than 4 times per year  . Active Member of Clubs or Organizations: Yes  . Attends Archivist Meetings: 1 to 4 times per year  . Marital Status: Married    Tobacco Counseling Counseling given: Not Answered   Clinical Intake:  Pre-visit preparation completed: Yes  Pain : 0-10 Pain Score: 4  (left knee & left hip) Pain Type: Chronic pain Pain Location: Knee Pain Orientation: Left Pain Descriptors / Indicators: Aching Pain Onset: More than a month ago Pain Frequency: Intermittent     BMI - recorded: 23.78 Nutritional Status: BMI of 19-24  Normal Nutritional Risks: None Diabetes: No  How often do you need to have someone help you when you read instructions, pamphlets, or other written materials from your doctor or pharmacy?: 1 - Never  Diabetic?No  Interpreter Needed?: No  Information entered by :: Charlott Rakes, LPN   Activities of Daily Living In your present state of health, do you have any difficulty performing the following activities: 10/17/2020  Hearing? N  Vision? N  Difficulty concentrating or making decisions? N  Walking or climbing stairs? N  Dressing or bathing? N  Doing errands, shopping? N  Preparing Food and eating ? N  Using the Toilet? N  In the past six  months, have you accidently leaked urine? Y  Comment occassionally  Do you have problems with loss of bowel control? N  Managing your Medications? N  Managing your Finances? N  Housekeeping or managing your Housekeeping? N  Some recent data might be hidden    Patient Care Team: Panosh, Standley Brooking, MD as PCP - General Danella Sensing, MD (Dermatology) Richmond Campbell, MD (Gastroenterology) Elsie Saas, MD (Orthopedic Surgery) Sherlynn Stalls, MD as Consulting Physician (Ophthalmology) [place], Heather, [place] (Optometry) [name], [facility] as Pharmacist (Pharmacist)  Indicate any recent Medical Services you may have received from other than [facility] providers in the past year (date may be  approximate).     Assessment:   This is a routine wellness examination for [facility].  Hearing/Vision screen  Hearing Screening   125Hz  250Hz  500Hz  1000Hz  2000Hz  3000Hz  4000Hz  6000Hz  8000Hz   Right ear:           Left ear:           Comments: Pt denies any hearing issues at this time  Vision Screening Comments: Follows up with Dr Heather [place] and retinal specialist for eye exams  Dietary issues and exercise activities discussed: Current Exercise Habits: Home exercise routine, Type of exercise: walking (grocery store and house work), Time (Minutes): 30, Frequency (Times/Week): 5, Weekly Exercise (Minutes/Week): 150  Goals    . Patient Stated     More exercise walking      Depression Screen PHQ 2/9 Scores 10/17/2020 01/04/2020 12/29/2018 12/23/2017 11/26/2016 11/21/2015 10/28/2013  PHQ - 2 Score 0 0 0 0 0 0 0    Fall Risk Fall Risk  10/17/2020 01/04/2020 12/29/2018 11/26/2016 11/21/2015  Falls in the past year? 0 0 0 No No  Number falls in past yr: 0 0 0 - -  Injury with Fall? 0 0 0 - -  Follow up Falls prevention discussed Falls evaluation completed - - -    Any stairs in or around the home? Yes  If so, are there any without handrails? No  Home free of loose throw rugs in walkways, pet beds, electrical  cords, etc? Yes  Adequate lighting in your home to reduce risk of falls? Yes   ASSISTIVE DEVICES UTILIZED TO PREVENT FALLS:  Life alert? No  Use of a cane, walker or w/c? No  Grab bars in the bathroom? Yes  Shower chair or bench in shower? No  Elevated toilet seat or a handicapped toilet? No   TIMED UP AND GO:  Was the test performed? No .      Cognitive Function:     6CIT Screen 10/17/2020  What Year? 0 points  What month? 0 points  Count back from 20 0 points  Months in reverse 0 points  Repeat phrase 0 points    Immunizations Immunization History  Administered Date(s) Administered  . Fluad Quad(high Dose 65+) 11/01/2019  . Influenza Whole 10/22/2011  . Influenza, High Dose Seasonal PF 10/28/2013, 10/24/2014, 11/21/2015, 09/30/2016, 10/22/2017  . Influenza-Unspecified 11/05/2018  . PFIZER SARS-COV-2 Vaccination 01/06/2020, 01/27/2020, 09/25/2020  . Pneumococcal Conjugate-13 10/24/2014  . Pneumococcal Polysaccharide-23 12/22/2008  . Td 12/22/2008  . Zoster 06/11/2010  . Zoster Recombinat (Shingrix) 07/06/2018, 10/05/2018    TDAP status: Due, Education has been provided regarding the importance of this vaccine. Advised may receive this vaccine at local pharmacy or Health Dept. Aware to provide a copy of the vaccination record if obtained from local pharmacy or Health Dept. Verbalized acceptance and understanding. Flu Vaccine status: Declined, Education has been provided regarding the importance of this vaccine but patient still declined. Advised may receive this vaccine at local pharmacy or Health Dept. Aware to provide a copy of the vaccination record if obtained from local pharmacy or Health Dept. Verbalized acceptance and understanding. Pneumococcal vaccine status: Up to date Covid-19 vaccine status: Completed vaccines  Qualifies for Shingles Vaccine? Yes   Zostavax completed Yes   Shingrix Completed?: Yes  Screening Tests Health Maintenance  Topic Date Due    . TETANUS/TDAP  12/22/2018  . INFLUENZA VACCINE  07/22/2020  . DEXA SCAN  Completed  . COVID-19 Vaccine  Completed  . PNA vac Low Risk Adult  Completed    Health Maintenance  Health Maintenance Due  Topic Date Due  . TETANUS/TDAP  12/22/2018  . INFLUENZA VACCINE  07/22/2020    Colorectal cancer screening: No longer required.  Mammogram status: No longer required.  Bone Density status: Completed 02/08/19. Results reflect: Bone density results: OSTEOPENIA. Repeat every 2-3 years.    Additional Screening:  Vision Screening: Recommended annual ophthalmology exams for early detection of glaucoma and other disorders of the eye. Is the patient up to date with their annual eye exam?  Yes  Who is the provider or what is the name of the office in which the patient attends annual eye exams? Dr Heather [place]   Dental Screening: Recommended annual dental exams for proper oral hygiene  Community Resource Referral / Chronic Care Management: CRR required this visit?  No   CCM required this visit?  No      Plan:     I have personally reviewed and noted the following in the patient's chart:   . Medical and social history . Use of alcohol, tobacco or illicit drugs  . Current medications and supplements . Functional ability and status . Nutritional status . Physical activity . Advanced directives . List of other physicians . Hospitalizations, surgeries, and ER visits in previous 12 months . Vitals . Screenings to include cognitive, depression, and falls . Referrals and appointments  In addition, I have reviewed and discussed with patient certain preventive protocols, quality metrics, and best practice recommendations. A written personalized care plan for preventive services as well as general preventive health recommendations were provided to patient.     Willette Brace, LPN   [id number]/88/[id number]   Nurse Notes: None  I have read this note and agree with its contents.  Alysia Penna, MD

## 2020-10-17 NOTE — Progress Notes (Deleted)
Subjective:   Christina Myers is a 79 y.o. female who presents for Medicare Annual (Subsequent) preventive examination.  Review of Systems    *** Cardiac Risk Factors include: advanced age (>8men, >86 women);dyslipidemia     Objective:    Today's Vitals   10/17/20 1215  PainSc: 4    There is no height or weight on file to calculate BMI.  Advanced Directives 10/17/2020  Does Patient Have a Medical Advance Directive? Yes  Type of Advance Directive Living will    Current Medications (verified) Outpatient Encounter Medications as of 10/17/2020  Medication Sig  . aspirin 81 MG tablet Take 81 mg by mouth daily.    . Cholecalciferol (VITAMIN D3) 2000 units TABS Take 2,000 Units by mouth daily.  . Eluxadoline (VIBERZI) 75 MG TABS Take by mouth as needed.  [name] estradiol (ESTRACE) 0.1 MG/GM vaginal cream 1 -2 gram intravaginally 2  x per week  . simvastatin (ZOCOR) 20 MG tablet TAKE 1 TABLET(20 MG) BY MOUTH AT BEDTIME  . SYNTHROID 100 MCG tablet TAKE 1 TABLET BY MOUTH DAILY  . BIOTIN PO Take by mouth. (Patient not taking: Reported on 10/17/2020)  . diclofenac sodium (VOLTAREN) 1 % GEL Apply 4 g topically 4 (four) times daily. (Patient not taking: Reported on 10/17/2020)  . tobramycin (TOBREX) 0.3 % ophthalmic solution  (Patient not taking: Reported on 10/17/2020)   No facility-administered encounter medications on file as of 10/17/2020.    Allergies (verified) Amoxicillin-pot clavulanate and Cefdinir   History: Past Medical History:  Diagnosis Date  . COLONIC POLYPS, HX OF 08/30/2007   Qualifier: Diagnosis of  By: Rogue Bussing CMA, Maryann Alar    . Hx of colonic polyps   . Hypertension   . Hypothyroidism   . OSTEOARTHRITIS, HAND 04/24/2009   Qualifier: Diagnosis of  By: Regis Bill MD, Standley Brooking   . PVC (premature ventricular contraction)    [facility] evaluation 7/03  . Seasonal allergies   . Skin cancer    hx of bcca of face  . SKIN CANCER, HX OF 02/02/2008   Qualifier: Diagnosis  of  By: Regis Bill MD, Standley Brooking    Past Surgical History:  Procedure Laterality Date  . FACIAL COSMETIC SURGERY    . TONSILLECTOMY    . tubal lig     Family History  Problem Relation Age of Onset  . Cancer Other        colon, prostate   . Diabetes Other   . Stroke Other   . Heart disease Other   . Colon cancer Mother    Social History   Socioeconomic History  . Marital status: Married    Spouse name: Not on file  . Number of children: Not on file  . Years of education: Not on file  . Highest education level: Not on file  Occupational History  . Not on file  Tobacco Use  . Smoking status: Former Research scientist (life sciences)  . Smokeless tobacco: Never Used  Substance and Sexual Activity  . Alcohol use: Yes    Comment: rarely  . Drug use: No  . Sexual activity: Not on file  Other Topics Concern  . Not on file  Social History Narrative   Married    Former smoker   Set designer and teaches tap dancing.    No etoh   HH of 2    Social Determinants of Health   Financial Resource Strain: Low Risk   . Difficulty of Paying Living Expenses: Not hard at all  Food Insecurity: No Food Insecurity  . Worried About Charity fundraiser in the Last Year: Never true  . Ran Out of Food in the Last Year: Never true  Transportation Needs: No Transportation Needs  . Lack of Transportation (Medical): No  . Lack of Transportation (Non-Medical): No  Physical Activity: Inactive  . Days of Exercise per Week: 0 days  . Minutes of Exercise per Session: 0 min  Stress: No Stress Concern Present  . Feeling of Stress : Not at all  Social Connections: Socially Integrated  . Frequency of Communication with Friends and Family: More than three times a week  . Frequency of Social Gatherings with Friends and Family: More than three times a week  . Attends Religious Services: More than 4 times per year  . Active Member of Clubs or Organizations: Yes  . Attends Archivist Meetings: 1 to 4 times per year  .  Marital Status: Married    Tobacco Counseling Counseling given: Not Answered   Clinical Intake:  Pre-visit preparation completed: Yes  Pain : 0-10 Pain Score: 4  (left knee & left hip) Pain Type: Chronic pain Pain Location: Knee Pain Orientation: Left Pain Descriptors / Indicators: Aching Pain Onset: More than a month ago Pain Frequency: Intermittent     BMI - recorded: 23.78 Nutritional Status: BMI of 19-24  Normal Nutritional Risks: None Diabetes: No  How often do you need to have someone help you when you read instructions, pamphlets, or other written materials from your doctor or pharmacy?: 1 - Never  Diabetic?***  Interpreter Needed?: No  Information entered by :: Charlott Rakes, LPN   Activities of Daily Living In your present state of health, do you have any difficulty performing the following activities: 10/17/2020  Hearing? N  Vision? N  Difficulty concentrating or making decisions? N  Walking or climbing stairs? N  Dressing or bathing? N  Doing errands, shopping? N  Preparing Food and eating ? N  Using the Toilet? N  In the past six months, have you accidently leaked urine? Y  Comment occassionally  Do you have problems with loss of bowel control? N  Managing your Medications? N  Managing your Finances? N  Housekeeping or managing your Housekeeping? N  Some recent data might be hidden    Patient Care Team: Panosh, Standley Brooking, MD as PCP - General Danella Sensing, MD (Dermatology) Richmond Campbell, MD (Gastroenterology) Elsie Saas, MD (Orthopedic Surgery) Sherlynn Stalls, MD as Consulting Physician (Ophthalmology) [place], Heather, [place] (Optometry) [name], [facility] as Pharmacist (Pharmacist)  Indicate any recent Medical Services you may have received from other than [facility] providers in the past year (date may be approximate).     Assessment:   This is a routine wellness examination for [facility].  Hearing/Vision screen  Hearing Screening   125Hz   250Hz  500Hz  1000Hz  2000Hz  3000Hz  4000Hz  6000Hz  8000Hz   Right ear:           Left ear:           Comments: Pt denies any hearing issues at this time  Vision Screening Comments: Follows up with Dr Heather [place] and retinal specialist for eye exams  Dietary issues and exercise activities discussed: Current Exercise Habits: Home exercise routine, Type of exercise: walking (grocery store and house work), Time (Minutes): 30, Frequency (Times/Week): 5, Weekly Exercise (Minutes/Week): 150  Goals    . Patient Stated     More exercise walking      Depression Screen PHQ  2/9 Scores 10/17/2020 01/04/2020 12/29/2018 12/23/2017 11/26/2016 11/21/2015 10/28/2013  PHQ - 2 Score 0 0 0 0 0 0 0    Fall Risk Fall Risk  10/17/2020 01/04/2020 12/29/2018 11/26/2016 11/21/2015  Falls in the past year? 0 0 0 No No  Number falls in past yr: 0 0 0 - -  Injury with Fall? 0 0 0 - -  Follow up Falls prevention discussed Falls evaluation completed - - -    Any stairs in or around the home? {YES/NO:21197} If so, are there any without handrails? {YES/NO:21197} Home free of loose throw rugs in walkways, pet beds, electrical cords, etc? {YES/NO:21197} Adequate lighting in your home to reduce risk of falls? {YES/NO:21197}  ASSISTIVE DEVICES UTILIZED TO PREVENT FALLS:  Life alert? {YES/NO:21197} Use of a cane, walker or w/c? {YES/NO:21197} Grab bars in the bathroom? {YES/NO:21197} Shower chair or bench in shower? {YES/NO:21197} Elevated toilet seat or a handicapped toilet? {YES/NO:21197}  TIMED UP AND GO:  Was the test performed? {YES/NO:21197}.  Length of time to ambulate 10 feet: *** sec.   {Appearance of JKKX:3818299}  Cognitive Function:     6CIT Screen 10/17/2020  What Year? 0 points  What month? 0 points  Count back from 20 0 points  Months in reverse 0 points  Repeat phrase 0 points    Immunizations Immunization History  Administered Date(s) Administered  . Fluad Quad(high Dose 65+) 11/01/2019  .  Influenza Whole 10/22/2011  . Influenza, High Dose Seasonal PF 10/28/2013, 10/24/2014, 11/21/2015, 09/30/2016, 10/22/2017  . Influenza-Unspecified 11/05/2018  . PFIZER SARS-COV-2 Vaccination 01/06/2020, 01/27/2020, 09/25/2020  . Pneumococcal Conjugate-13 10/24/2014  . Pneumococcal Polysaccharide-23 12/22/2008  . Td 12/22/2008  . Zoster 06/11/2010  . Zoster Recombinat (Shingrix) 07/06/2018, 10/05/2018    {TDAP status:2101805} {Flu Vaccine status:2101806} {Pneumococcal vaccine status:2101807} {Covid-19 vaccine status:2101808}  Qualifies for Shingles Vaccine? {YES/NO:21197}  Zostavax completed {YES/NO:21197}  {Shingrix Completed?:2101804}  Screening Tests Health Maintenance  Topic Date Due  . TETANUS/TDAP  12/22/2018  . INFLUENZA VACCINE  07/22/2020  . DEXA SCAN  Completed  . COVID-19 Vaccine  Completed  . PNA vac Low Risk Adult  Completed    Health Maintenance  Health Maintenance Due  Topic Date Due  . TETANUS/TDAP  12/22/2018  . INFLUENZA VACCINE  07/22/2020    {Colorectal cancer screening:2101809} {Mammogram status:21018020} {Bone Density status:21018021}  Lung Cancer Screening: (Low Dose CT Chest recommended if Age 77-80 years, 30 pack-year currently smoking OR have quit w/in 15years.) {DOES NOT does:27190::"does not"} qualify.   Lung Cancer Screening Referral: ***  Additional Screening:  Hepatitis C Screening: {DOES NOT does:27190::"does not"} qualify; Completed ***  Vision Screening: Recommended annual ophthalmology exams for early detection of glaucoma and other disorders of the eye. Is the patient up to date with their annual eye exam?  {YES/NO:21197} Who is the provider or what is the name of the office in which the patient attends annual eye exams? *** If pt is not established with a provider, would they like to be referred to a provider to establish care? {YES/NO:21197}.   Dental Screening: Recommended annual dental exams for proper oral  hygiene  Community Resource Referral / Chronic Care Management: CRR required this visit?  {YES/NO:21197}  CCM required this visit?  {YES/NO:21197}     Plan:     I have personally reviewed and noted the following in the patient's chart:   . Medical and social history . Use of alcohol, tobacco or illicit drugs  . Current medications and supplements . Functional ability  and status . Nutritional status . Physical activity . Advanced directives . List of other physicians . Hospitalizations, surgeries, and ER visits in previous 12 months . Vitals . Screenings to include cognitive, depression, and falls . Referrals and appointments  In addition, I have reviewed and discussed with patient certain preventive protocols, quality metrics, and best practice recommendations. A written personalized care plan for preventive services as well as general preventive health recommendations were provided to patient.     Willette Brace, LPN   72/08/1979   Nurse Notes: ***

## 2020-10-17 NOTE — Patient Instructions (Signed)
[name] , Thank you for taking time to come for your Medicare Wellness Visit. I appreciate your ongoing commitment to your health goals. Please review the following plan we discussed and let me know if I can assist you in the future.   Screening recommendations/referrals: Colonoscopy: No longer required done 08/23/20 Mammogram: No longer required Done 06/13/20 Bone Density: Done 02/08/19 Recommended yearly ophthalmology/optometry visit for glaucoma screening and checkup Recommended yearly dental visit for hygiene and checkup  Vaccinations: Influenza vaccine: Pt states  Pneumococcal vaccine: Up to date Tdap vaccine: Due and discussed Shingles vaccine: Shingrix discussed. Please contact your pharmacy for coverage information.    Covid-19:Completed 1/5, 2/5, 09/25/20  Advanced directives: Please bring a copy of your health care power of attorney and living will to the office at your convenience.  Conditions/risks identified: Exercise with more walking  Next appointment: Follow up in one year for your annual wellness visit    Preventive Care 65 Years and Older, Female Preventive care refers to lifestyle choices and visits with your health care provider that can promote health and wellness. What does preventive care include?  A yearly physical exam. This is also called an annual well check.  Dental exams once or twice a year.  Routine eye exams. Ask your health care provider how often you should have your eyes checked.  Personal lifestyle choices, including:  Daily care of your teeth and gums.  Regular physical activity.  Eating a healthy diet.  Avoiding tobacco and drug use.  Limiting alcohol use.  Practicing safe sex.  Taking low-dose aspirin every day.  Taking vitamin and mineral supplements as recommended by your health care provider. What happens during an annual well check? The services and screenings done by your health care provider during your annual well check  will depend on your age, overall health, lifestyle risk factors, and family history of disease. Counseling  Your health care provider may ask you questions about your:  Alcohol use.  Tobacco use.  Drug use.  Emotional well-being.  Home and relationship well-being.  Sexual activity.  Eating habits.  History of falls.  Memory and ability to understand (cognition).  Work and work Statistician.  Reproductive health. Screening  You may have the following tests or measurements:  Height, weight, and BMI.  Blood pressure.  Lipid and cholesterol levels. These may be checked every 5 years, or more frequently if you are over 43 years old.  Skin check.  Lung cancer screening. You may have this screening every year starting at age 93 if you have a 30-pack-year history of smoking and currently smoke or have quit within the past 15 years.  Fecal occult blood test (FOBT) of the stool. You may have this test every year starting at age 2.  Flexible sigmoidoscopy or colonoscopy. You may have a sigmoidoscopy every 5 years or a colonoscopy every 10 years starting at age 64.  Hepatitis C blood test.  Hepatitis B blood test.  Sexually transmitted disease (STD) testing.  Diabetes screening. This is done by checking your blood sugar (glucose) after you have not eaten for a while (fasting). You may have this done every 1-3 years.  Bone density scan. This is done to screen for osteoporosis. You may have this done starting at age 50.  Mammogram. This may be done every 1-2 years. Talk to your health care provider about how often you should have regular mammograms. Talk with your health care provider about your test results, treatment options, and if necessary,  the need for more tests. Vaccines  Your health care provider may recommend certain vaccines, such as:  Influenza vaccine. This is recommended every year.  Tetanus, diphtheria, and acellular pertussis (Tdap, Td) vaccine. You may  need a Td booster every 10 years.  Zoster vaccine. You may need this after age 13.  Pneumococcal 13-valent conjugate (PCV13) vaccine. One dose is recommended after age 29.  Pneumococcal polysaccharide (PPSV23) vaccine. One dose is recommended after age 7. Talk to your health care provider about which screenings and vaccines you need and how often you need them. This information is not intended to replace advice given to you by your health care provider. Make sure you discuss any questions you have with your health care provider. Document Released: 01/04/2016 Document Revised: 08/27/2016 Document Reviewed: 10/09/2015 Elsevier Interactive Patient Education  2017 [employer] Prevention in the Home Falls can cause injuries. They can happen to people of all ages. There are many things you can do to make your home safe and to help prevent falls. What can I do on the outside of my home?  Regularly fix the edges of walkways and driveways and fix any cracks.  Remove anything that might make you trip as you walk through a door, such as a raised step or threshold.  Trim any bushes or trees on the path to your home.  Use bright outdoor lighting.  Clear any walking paths of anything that might make someone trip, such as rocks or tools.  Regularly check to see if handrails are loose or broken. Make sure that both sides of any steps have handrails.  Any raised decks and porches should have guardrails on the edges.  Have any leaves, snow, or ice cleared regularly.  Use sand or salt on walking paths during winter.  Clean up any spills in your garage right away. This includes oil or grease spills. What can I do in the bathroom?  Use night lights.  Install grab bars by the toilet and in the tub and shower. Do not use towel bars as grab bars.  Use non-skid mats or decals in the tub or shower.  If you need to sit down in the shower, use a plastic, non-slip stool.  Keep the floor  dry. Clean up any water that spills on the floor as soon as it happens.  Remove soap buildup in the tub or shower regularly.  Attach bath mats securely with double-sided non-slip rug tape.  Do not have throw rugs and other things on the floor that can make you trip. What can I do in the bedroom?  Use night lights.  Make sure that you have a light by your bed that is easy to reach.  Do not use any sheets or blankets that are too big for your bed. They should not hang down onto the floor.  Have a firm chair that has side arms. You can use this for support while you get dressed.  Do not have throw rugs and other things on the floor that can make you trip. What can I do in the kitchen?  Clean up any spills right away.  Avoid walking on wet floors.  Keep items that you use a lot in easy-to-reach places.  If you need to reach something above you, use a strong step stool that has a grab bar.  Keep electrical cords out of the way.  Do not use floor polish or wax that makes floors slippery. If you must use  wax, use non-skid floor wax.  Do not have throw rugs and other things on the floor that can make you trip. What can I do with my stairs?  Do not leave any items on the stairs.  Make sure that there are handrails on both sides of the stairs and use them. Fix handrails that are broken or loose. Make sure that handrails are as long as the stairways.  Check any carpeting to make sure that it is firmly attached to the stairs. Fix any carpet that is loose or worn.  Avoid having throw rugs at the top or bottom of the stairs. If you do have throw rugs, attach them to the floor with carpet tape.  Make sure that you have a light switch at the top of the stairs and the bottom of the stairs. If you do not have them, ask someone to add them for you. What else can I do to help prevent falls?  Wear shoes that:  Do not have high heels.  Have rubber bottoms.  Are comfortable and fit you  well.  Are closed at the toe. Do not wear sandals.  If you use a stepladder:  Make sure that it is fully opened. Do not climb a closed stepladder.  Make sure that both sides of the stepladder are locked into place.  Ask someone to hold it for you, if possible.  Clearly mark and make sure that you can see:  Any grab bars or handrails.  First and last steps.  Where the edge of each step is.  Use tools that help you move around (mobility aids) if they are needed. These include:  Canes.  Walkers.  Scooters.  Crutches.  Turn on the lights when you go into a dark area. Replace any light bulbs as soon as they burn out.  Set up your furniture so you have a clear path. Avoid moving your furniture around.  If any of your floors are uneven, fix them.  If there are any pets around you, be aware of where they are.  Review your medicines with your doctor. Some medicines can make you feel dizzy. This can increase your chance of falling. Ask your doctor what other things that you can do to help prevent falls. This information is not intended to replace advice given to you by your health care provider. Make sure you discuss any questions you have with your health care provider. Document Released: 10/04/2009 Document Revised: 05/15/2016 Document Reviewed: 01/12/2015 Elsevier Interactive Patient Education  2017 [employer].

## 2020-10-18 DIAGNOSIS — Z85828 Personal history of other malignant neoplasm of skin: Secondary | ICD-10-CM | POA: Diagnosis not present

## 2020-10-18 DIAGNOSIS — D485 Neoplasm of uncertain behavior of skin: Secondary | ICD-10-CM | POA: Diagnosis not present

## 2020-10-18 DIAGNOSIS — L942 Calcinosis cutis: Secondary | ICD-10-CM | POA: Diagnosis not present

## 2020-10-29 ENCOUNTER — Telehealth: Payer: Self-pay | Admitting: Pharmacist

## 2020-10-29 NOTE — Chronic Care Management (AMB) (Signed)
    Chronic Care Management Pharmacy Assistant   Name: Christina Myers  MRN: [id number] DOB: August 07, 1941  Reason for Encounter: Medication Review/Initial Questions for Pharmacist visit on 10/31/2020  Patient Questions:  1. Have you seen any other providers since your last visit? . Yes: Medoff, Christina Myers [facility]  2. Any changes in your medications or health? No 3. Any side effects from any medications? No 4. 4. Do you have any symptoms or problems not managed by your medications? No 5. Any concerns about your health right now? No 6. Has your provider asked that you check blood pressure, blood sugar, or follow a special diet at home? No 7. Do you get any type of exercise regularly?  . 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  8. Can you think of a goal you would like to reach for your health?  Christina Myers She would like to have less hip pain   9. Do you have any problems getting your medications? No 10. Is there anything that you would like to discuss during the appointment? No  The patient was asked to please bring medications, blood pressure/ blood sugar log, and supplements to your appointment.  PCP : Burnis Medin, MD  Allergies:   Allergies  Allergen Reactions  . Amoxicillin-Pot Clavulanate     REACTION: colitis  after ceclor  adn augmentin years ago  . Cefdinir     REACTION: "colitis" diarrhea    Medications: Outpatient Encounter Medications as of 10/29/2020  Medication Sig Note  . aspirin 81 MG tablet Take 81 mg by mouth daily.     Christina Myers BIOTIN PO Take by mouth. (Patient not taking: Reported on 10/17/2020)   . Cholecalciferol (VITAMIN D3) 2000 units TABS Take 2,000 Units by mouth daily.   . diclofenac sodium (VOLTAREN) 1 % GEL Apply 4 g topically 4 (four) times daily. (Patient not taking: Reported on 10/17/2020)   . Eluxadoline (VIBERZI) 75 MG TABS Take by mouth as needed.   Christina Myers estradiol (ESTRACE) 0.1 MG/GM vaginal cream 1 -2 gram intravaginally  2  x per week   . simvastatin (ZOCOR) 20 MG tablet TAKE 1 TABLET(20 MG) BY MOUTH AT BEDTIME   . SYNTHROID 100 MCG tablet TAKE 1 TABLET BY MOUTH DAILY   . tobramycin (TOBREX) 0.3 % ophthalmic solution  (Patient not taking: Reported on 10/17/2020) 10/17/2020: As needed   No facility-administered encounter medications on file as of 10/29/2020.    Current Diagnosis: Patient Active Problem List   Diagnosis Date Noted  . Ex-smoker 08/13/2018  . Atrophic vaginitis 03/04/2018  . Menopausal problem 03/04/2018  . Osteoarthritis 03/04/2018  . Postmenopausal bleeding 03/04/2018  . AMD (age related macular degeneration) 11/26/2016  . IBS (irritable bowel syndrome) 11/24/2015  . Nocturnal foot cramps 10/28/2013  . Postmenopausal HRT (hormone replacement therapy) 11/06/2011  . Other reasons for seeking consultation 10/01/2011  . SINUS BRADYCARDIA 04/30/2010  . OSTEOARTHRITIS, HAND 04/24/2009  . HYPERLIPIDEMIA 02/02/2008  . HAND PAIN 02/02/2008  . SKIN CANCER, HX OF 02/02/2008  . Hypothyroidism 08/30/2007  . COLONIC POLYPS, HX OF 08/30/2007    Goals Addressed   None     Follow-Up:  Pharmacist Review   Maia Breslow, [place] Assistant [phone]

## 2020-10-30 ENCOUNTER — Telehealth: Payer: Self-pay

## 2020-10-30 DIAGNOSIS — E782 Mixed hyperlipidemia: Secondary | ICD-10-CM

## 2020-10-30 DIAGNOSIS — E039 Hypothyroidism, unspecified: Secondary | ICD-10-CM

## 2020-10-30 NOTE — Telephone Encounter (Signed)
-----   Message from [name], [facility] sent at 10/30/2020  1:42 PM EST ----- Regarding: CCM referral Good afternoon,  Can you please put in a CCM referral for [name]?  Thank you so much! Maddie

## 2020-10-31 ENCOUNTER — Ambulatory Visit: Payer: Medicare Other | Admitting: Pharmacist

## 2020-10-31 DIAGNOSIS — E039 Hypothyroidism, unspecified: Secondary | ICD-10-CM

## 2020-10-31 DIAGNOSIS — E782 Mixed hyperlipidemia: Secondary | ICD-10-CM

## 2020-10-31 NOTE — Chronic Care Management (AMB) (Signed)
Chronic Care Management Pharmacy  Name: Christina Myers  MRN: [id number] DOB: [birth year]  Initial Planning Appointment: completed 10/29/20  Initial Questions: 1. Have you seen any other providers since your last visit? n/a 2. Any changes in your medicines or health? No   Chief Complaint/ HPI  Christina Myers,  79 y.o. , female presents for their Initial CCM visit with the clinical pharmacist via telephone due to COVID-19 Pandemic.  PCP : Burnis Medin, MD  Their chronic conditions include: HLD, hypothyroidism, osteopenia, osteoarthritis, IBS, heart event prevention, UTI prevention  Office Visits: -10/17/20 Charlott Rakes, LPN: Patient presented for medicare annual wellness exam.  02/16/20 Colin Benton, DO: Patient presented for telephonic visit for leg cramps. Patient is on Bactrim for UTI and has had leg cramps during this time. She has been on Bactrium for 3 full days and opted to discontinue and not switch to alternative abx at this time.  02/13/20 Shanon Ace, MD: Patient presented for telephonic visit due to possible UTI.  Prescribed Bactrim BID x 5 days. Trial of topical vaginal estrogen.   01/04/20 Shanon Ace, MD: Patient presented for follow up for chronic conditions. No changes made. Follow up in 1 year.  Consult Visit: -08/24/20 Patient presented to urgent care due to aspiration during a colonoscopy. Patient was told to come to urgent care for x-ray due to possible fluid in lungs. Prescribed clarithromycin x 10 day course.  Medications: Outpatient Encounter Medications as of 10/31/2020  Medication Sig Note  . aspirin 81 MG tablet Take 81 mg by mouth daily.     . Cholecalciferol (VITAMIN D3) 2000 units TABS Take 2,000 Units by mouth daily.   . Eluxadoline (VIBERZI) 75 MG TABS Take by mouth as needed.   [name] estradiol (ESTRACE) 0.1 MG/GM vaginal cream 1 -2 gram intravaginally 2  x per week   . simvastatin (ZOCOR) 20 MG tablet TAKE 1 TABLET(20 MG) BY MOUTH AT BEDTIME   .  SYNTHROID 100 MCG tablet TAKE 1 TABLET BY MOUTH DAILY   . tobramycin (TOBREX) 0.3 % ophthalmic solution  10/17/2020: As needed  . [DISCONTINUED] BIOTIN PO Take by mouth. (Patient not taking: Reported on 10/17/2020)   . [DISCONTINUED] diclofenac sodium (VOLTAREN) 1 % GEL Apply 4 g topically 4 (four) times daily. (Patient not taking: Reported on 10/17/2020)    No facility-administered encounter medications on file as of 10/31/2020.   Patient lives with Cornelia Copa, her husband. They do not have family in the area but she does have friends in the area and sees them often. 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 Patient denies any problems with driving.  Patient's husband does the grocery shopping and she makes the list and he does most of the cooking. They have a cleaning girl come in every other week.  For cooking they eat meat (more chicken, pork tenderloin, fish occasionally) a salad and a vegetable. They eat out a few nights of the week. She does admit to eating a cookie after dinner each night.  Patient gets close to 8 hours of sleep each night and wakes up due to hip problems and left knee pain and gets up to go to the bathroom 3 times a night. She does have some trouble falling asleep with pain and takes 2 zz quill and melatonin 10 mg at night.   Patient denies any issues with her current medications and is excited that Synthroid is now on  her formulary and is affordable.  Current Diagnosis/Assessment:  Goals Addressed            This Visit's Progress   . Pharmacy Care Plan       CARE PLAN ENTRY (see longitudinal plan of care for additional care plan information)  Current Barriers:  . Chronic Disease Management support, education, and care coordination needs related to Hyperlipidemia, Hypothyroidism, Osteopenia, and heart event prevention   Hypothyroidism . Pharmacist Clinical Goal(s): o Over the next 180 days, patient will  work with PharmD and providers to maintain TSH between 0.35 to 4.5 . Current regimen:  o Synthroid 100 mcg 1 tablet daily . Interventions: o Discussed administration on an empty stomach with water prior to other medications and food . Patient self care activities - Over the next 180 days, patient will: o Continue current medication  Hyperlipidemia Lab Results  Component Value Date/Time   LDLCALC 83 11/19/2016 09:15 AM   LDLDIRECT 103.0 12/28/2019 09:53 AM   . Pharmacist Clinical Goal(s): o Over the next 180 days, patient will work with PharmD and providers to achieve LDL goal < 100 . Current regimen:  o Simvastatin 20 mg 1 tablet at bedtime . Interventions: o Discussed lowering cholesterol through diet by: [name] Limiting foods with cholesterol such as liver and other organ meats, egg yolks, shrimp, and whole milk dairy products . Avoiding saturated fats and trans fats and incorporating healthier fats, such as lean meat, nuts, and unsaturated oils like canola and olive oils . Eating foods with soluble fiber such as whole-grain cereals such as oatmeal and oat bran, fruits such as apples, bananas, oranges, pears, and prunes, legumes such as kidney beans, lentils, chick peas, black-eyed peas, and lima beans, and green leafy vegetables . Limiting alcohol intake o Discussed how triglycerides can increase when: . Eating eat too much food . Eating high-fat foods such as fried foods, red meat, chicken skin, egg yolks, high-fat dairy, butter, lard, shortening, margarine, and fast food . Eating foods high in simple carbohydrates such as fresh and canned fruit, candy, ice cream and sweetened yogurt, sweetened drinks like juices, cereal, jams, foods and drinks with corn syrup, or sugar listed as the first ingredient . Drinking alcohol  . Patient self care activities - Over the next 180 days, patient will: o Continue current medication o Make modifications to diet to lower cholesterol and  triglycerides  Osteopenia . Pharmacist Clinical Goal(s): o Over the next 180 days, patient will work with PharmD and providers to maintain bone density and prevent fractures . Current regimen:  o Vitamin D 2000 units 1 tablet daily . Interventions: o Discussed recommendations for [phone] units of vitamin D daily and 200 mg of calcium daily from dietary and supplemental sources.  o Recommended weight-bearing and muscle strengthening exercises for building and maintaining bone density. . Patient self care activities - Over the next 180 days, patient will: o Ensure she is getting 1200 mg of calcium from diet and supplementation o Continue taking vitamin D daily  Heart event prevention . Pharmacist Clinical Goal(s) o Over the next 180 days, patient will work with PharmD and providers to prevent heart events . Current regimen:  o Aspirin 81 mg 1 tablet daily . Interventions: o Discussed monitoring for signs of bleeding such as unexplained and excessive bleeding from a cut or injury, easy or excessive bruising, blood in urine or stools, and nosebleeds without a known cause . Patient self care activities - Over the next 180 days, patient  will: o Continue current medication  Medication management . Pharmacist Clinical Goal(s): o Over the next 180 days, patient will work with PharmD and providers to maintain optimal medication adherence . Current pharmacy: [facility] . Interventions o Comprehensive medication review performed. o Continue current medication management strategy . Patient self care activities - Over the next 180 days, patient will: o Take medications as prescribed o Report any questions or concerns to PharmD and/or provider(s)  Initial goal documentation       Hyperlipidemia   LDL goal < 100  Last lipids Lab Results  Component Value Date   CHOL 190 12/28/2019   HDL 48.90 12/28/2019   LDLCALC 83 11/19/2016   LDLDIRECT 103.0 12/28/2019   TRIG 260.0 (H) 12/28/2019    CHOLHDL 4 12/28/2019   Hepatic Function Latest Ref Rng & Units 12/28/2019 12/29/2018 12/23/2017  Total Protein 6.0 - 8.3 g/dL 6.0 6.4 6.7  Albumin 3.5 - 5.2 g/dL 4.1 4.3 4.4  AST 0 - 37 U/L _0 ALT 0 - 35 U/L _1 Alk Phosphatase 39 - 117 U/L 82 91 115  Total Bilirubin 0.2 - 1.2 mg/dL 0.4 0.4 0.4  Bilirubin, Direct 0.0 - 0.3 mg/dL 0.1 0.1 0.1     The ASCVD Risk score ([place]., et al., 2013) failed to calculate for the following reasons:   The 2013 ASCVD risk score is only valid for ages 39 to 33   Patient has failed these meds in past: none Patient is currently uncontrolled on the following medications:  . Simvastatin 20 mg 1 tablet at bedtime  We discussed:  diet and exercise extensively  -Lowering cholesterol through diet by: [name] Limiting foods with cholesterol such as liver and other organ meats, egg yolks, shrimp, and whole milk dairy products . Avoiding saturated fats and trans fats and incorporating healthier fats, such as lean meat, nuts, and unsaturated oils like canola and olive oils . Eating foods with soluble fiber such as whole-grain cereals such as oatmeal and oat bran, fruits such as apples, bananas, oranges, pears, and prunes, legumes such as kidney beans, lentils, chick peas, black-eyed peas, and lima beans, and green leafy vegetables . Limiting alcohol intake -Discussed how triglycerides can increase when: . Eating eat too much food . Eating high-fat foods such as fried foods, red meat, chicken skin, egg yolks, high-fat dairy, butter, lard, shortening, margarine, and fast food . Eating foods high in simple carbohydrates such as fresh and canned fruit, candy, ice cream and sweetened yogurt, sweetened drinks like juices, cereal, jams, foods and drinks with corn syrup, or sugar listed as the first ingredient . Drinking alcohol  -Discussed recommendations for moderate aerobic exercise for 150 minutes/week spread out over 5 days for heart healthy  lifestyle  Plan  Continue current medications   Hypothyroidism   Lab Results  Component Value Date/Time   TSH 0.63 12/28/2019 09:53 AM   TSH 0.19 (L) 12/29/2018 03:11 PM   FREET4 1.07 12/28/2019 09:53 AM    Patient has failed these meds in past: none Patient is currently controlled on the following medications:  . Synthroid 100 mcg 1 tablet daily  We discussed:  administration on an empty stomach with water; recommended separating Biotin from TSH lab days by 2-3 days ahead of time  Plan  Continue current medications   Osteopenia   Last DEXA Scan: 02/08/2019   T-Score femoral neck: R -1.7, L -1.2  T-Score total hip: n/a  T-Score lumbar spine: -0.8  T-Score forearm radius: n/a  10-year probability of major osteoporotic fracture: 14%  10-year probability of hip fracture: 3.7%  Vit D, 25-Hydroxy  Date Value Ref Range Status  01/31/2019 24  Final     Patient is a candidate for pharmacologic treatment due to T-Score -1.0 to -2.5 and 10-year risk of hip fracture > 3%  Patient has failed these meds in past: none Patient is currently uncontrolled on the following medications:  [name] Vitamin D 2000 units 1 tablet daily  We discussed:  Recommend [phone] units of vitamin D daily. Recommend 1200 mg of calcium daily from dietary and supplemental sources. Recommend weight-bearing and muscle strengthening exercises for building and maintaining bone density.  Plan  Continue current medications   Osteoarthritis   Patient has failed these meds in past:  Patient is currently controlled on the following medications:  . No medications  We discussed:  Using Tylenol vs ibuprofen to reduce risk of bleeding with concurrent aspirin therapy  Plan  Continue without medications.   IBS   Patient has failed these meds in past: none Patient is currently controlled on the following medications:  [name] Viberzi 75 mg 1 tablet as needed  We discussed:  Patient only uses this when traveling  to prevent her from going to the bathroom  Plan  Continue current medications  Heart event prevention   Patient has failed these meds in past: none Patient is currently controlled on the following medications:  . Aspirin 81 mg 1 tablet daily  We discussed:  Monitoring for signs of bleeding such as unexplained and excessive bleeding from a cut or injury, easy or excessive bruising, blood in urine or stools, and nosebleeds without a known cause; patient notes ocassional bruising    Plan  Continue current medications  Reassess the need for this at follow up.   UTI prevention   Patient has failed these meds in past: none Patient is currently controlled on the following medications:  . Estradiol 0.1 mg/g vaginal cream PRN   We discussed:  Patient uses about once a month  Plan  Continue current medications   Vaccines   Reviewed and discussed patient's vaccination history.    Immunization History  Administered Date(s) Administered  . Fluad Quad(high Dose 65+) 11/01/2019  . Influenza Whole 10/22/2011  . Influenza, High Dose Seasonal PF 10/28/2013, 10/24/2014, 11/21/2015, 09/30/2016, 10/22/2017, 10/25/2020  . Influenza-Unspecified 11/05/2018  . PFIZER SARS-COV-2 Vaccination 01/06/2020, 01/27/2020, 09/25/2020  . Pneumococcal Conjugate-13 10/24/2014  . Pneumococcal Polysaccharide-23 12/22/2008  . Td 12/22/2008  . Zoster 06/11/2010  . Zoster Recombinat (Shingrix) 07/06/2018, 10/05/2018   Patient received influenza vaccine last [facility] at [facility].  Plan  Recommended patient receive tetanus vaccine in office/at pharmacy.   Medication Management   Pt uses [facility] for all medications Uses pill box? Yes - 7 day pill box Pt endorses 95% compliance - 2-3 missed doses of preservision  We discussed: Current pharmacy is preferred with insurance plan and patient is satisfied with pharmacy services  Plan  Continue current medication management  strategy   Follow up: 6 month phone visit  Jeni Salles, PharmD Clinical Pharmacist [place] [facility] [place] [phone]

## 2020-11-09 DIAGNOSIS — H353222 Exudative age-related macular degeneration, left eye, with inactive choroidal neovascularization: Secondary | ICD-10-CM | POA: Diagnosis not present

## 2020-11-09 DIAGNOSIS — H353112 Nonexudative age-related macular degeneration, right eye, intermediate dry stage: Secondary | ICD-10-CM | POA: Diagnosis not present

## 2020-11-12 ENCOUNTER — Telehealth: Payer: Self-pay | Admitting: Internal Medicine

## 2020-11-12 ENCOUNTER — Other Ambulatory Visit: Payer: Self-pay | Admitting: Internal Medicine

## 2020-11-12 DIAGNOSIS — E782 Mixed hyperlipidemia: Secondary | ICD-10-CM

## 2020-11-12 DIAGNOSIS — Z79899 Other long term (current) drug therapy: Secondary | ICD-10-CM

## 2020-11-12 DIAGNOSIS — E039 Hypothyroidism, unspecified: Secondary | ICD-10-CM

## 2020-11-12 DIAGNOSIS — K589 Irritable bowel syndrome without diarrhea: Secondary | ICD-10-CM

## 2020-11-12 DIAGNOSIS — Z87440 Personal history of urinary (tract) infections: Secondary | ICD-10-CM

## 2020-11-12 NOTE — Telephone Encounter (Signed)
I have placed the orders future. She can get a lab appointment a week before her scheduled physical so we will have the results at her visit. I did not order a urinalysis if she is having a symptom we can do it at the visit.

## 2020-11-12 NOTE — Telephone Encounter (Signed)
Patient is calling and is scheduled for a physical on 01/09/2021 and wanted to see  If Dr. Regis Bill put orders in to do labs prior to appointment, please advise. CB is [phone]

## 2020-11-12 NOTE — Telephone Encounter (Signed)
Okay to schedule lab appt a week prior to physical.

## 2020-11-13 NOTE — Patient Instructions (Addendum)
Hi Christina Myers,  It was lovely to get to speak with you over the phone! Below is a summary of some of the things we discussed. I also attached some more information on sleep hygiene that I hope will be helpful for you. I do think taking zzquil is not the best option and trying to avoid anything with Benadryl (diphenhydramine) in it is recommended due to the increased risk for falls. Also, don't forget to look into getting your tetanus shot at the pharmacy or in the office and make sure you are getting enough calcium from your diet an supplementation (1200 mg per day) in order to strength your bones!  I will plan on having my assistant Christina Myers reach out to you in about 3 months just to check in and see if you need anything from [place]! Otherwise, I will talk to you again in 6 months over the phone.  Please don't hesitate to give me a call if you have questions or need anything in the meantime!  Best, Christina Myers  Visit Information  Goals Addressed            This Visit's Progress   . Pharmacy Care Plan       CARE PLAN ENTRY (see longitudinal plan of care for additional care plan information)  Current Barriers:  . Chronic Disease Management support, education, and care coordination needs related to Hyperlipidemia, Hypothyroidism, Osteopenia, and heart event prevention   Hypothyroidism . Pharmacist Clinical Goal(s): o Over the next 180 days, patient will work with PharmD and providers to maintain TSH between 0.35 to 4.5 . Current regimen:  o Synthroid 100 mcg 1 tablet daily . Interventions: o Discussed administration on an empty stomach with water prior to other medications and food . Patient self care activities - Over the next 180 days, patient will: o Continue current medication  Hyperlipidemia Lab Results  Component Value Date/Time   LDLCALC 83 11/19/2016 09:15 AM   LDLDIRECT 103.0 12/28/2019 09:53 AM   . Pharmacist Clinical Goal(s): o Over the next 180 days, patient will work with PharmD and  providers to achieve LDL goal < 100 . Current regimen:  o Simvastatin 20 mg 1 tablet at bedtime . Interventions: o Discussed lowering cholesterol through diet by: [name] Limiting foods with cholesterol such as liver and other organ meats, egg yolks, shrimp, and whole milk dairy products . Avoiding saturated fats and trans fats and incorporating healthier fats, such as lean meat, nuts, and unsaturated oils like canola and olive oils . Eating foods with soluble fiber such as whole-grain cereals such as oatmeal and oat bran, fruits such as apples, bananas, oranges, pears, and prunes, legumes such as kidney beans, lentils, chick peas, black-eyed peas, and lima beans, and green leafy vegetables . Limiting alcohol intake o Discussed how triglycerides can increase when: . Eating eat too much food . Eating high-fat foods such as fried foods, red meat, chicken skin, egg yolks, high-fat dairy, butter, lard, shortening, margarine, and fast food . Eating foods high in simple carbohydrates such as fresh and canned fruit, candy, ice cream and sweetened yogurt, sweetened drinks like juices, cereal, jams, foods and drinks with corn syrup, or sugar listed as the first ingredient . Drinking alcohol  . Patient self care activities - Over the next 180 days, patient will: o Continue current medication o Make modifications to diet to lower cholesterol and triglycerides  Osteopenia . Pharmacist Clinical Goal(s): o Over the next 180 days, patient will work with PharmD and providers to  maintain bone density and prevent fractures . Current regimen:  o Vitamin D 2000 units 1 tablet daily . Interventions: o Discussed recommendations for [phone] units of vitamin D daily and 200 mg of calcium daily from dietary and supplemental sources.  o Recommended weight-bearing and muscle strengthening exercises for building and maintaining bone density. . Patient self care activities - Over the next 180 days, patient will: o Ensure  she is getting 1200 mg of calcium from diet and supplementation o Continue taking vitamin D daily  Heart event prevention . Pharmacist Clinical Goal(s) o Over the next 180 days, patient will work with PharmD and providers to prevent heart events . Current regimen:  o Aspirin 81 mg 1 tablet daily . Interventions: o Discussed monitoring for signs of bleeding such as unexplained and excessive bleeding from a cut or injury, easy or excessive bruising, blood in urine or stools, and nosebleeds without a known cause . Patient self care activities - Over the next 180 days, patient will: o Continue current medication  Medication management . Pharmacist Clinical Goal(s): o Over the next 180 days, patient will work with PharmD and providers to maintain optimal medication adherence . Current pharmacy: [facility] . Interventions o Comprehensive medication review performed. o Continue current medication management strategy . Patient self care activities - Over the next 180 days, patient will: o Take medications as prescribed o Report any questions or concerns to PharmD and/or provider(s)  Initial goal documentation        [name] was given information about Chronic Care Management services today including:  1. CCM service includes personalized support from designated clinical staff supervised by her physician, including individualized plan of care and coordination with other care providers 2. 24/7 contact phone numbers for assistance for urgent and routine care needs. 3. Standard insurance, coinsurance, copays and deductibles apply for chronic care management only during months in which we provide at least 20 minutes of these services. Most insurances cover these services at 100%, however patients may be responsible for any copay, coinsurance and/or deductible if applicable. This service may help you avoid the need for more expensive face-to-face services. 4. Only one practitioner may furnish  and bill the service in a calendar month. 5. The patient may stop CCM services at any time (effective at the end of the month) by phone call to the office staff.  Patient agreed to services and verbal consent obtained.   The patient verbalized understanding of instructions, educational materials, and care plan provided today and agreed to receive a mailed copy of patient instructions, educational materials, and care plan.  Telephone follow up appointment with pharmacy team member scheduled for: 6 months   Insomnia Insomnia is a sleep disorder that makes it difficult to fall asleep or stay asleep. Insomnia can cause fatigue, low energy, difficulty concentrating, mood swings, and poor performance at work or school. There are three different ways to classify insomnia:  Difficulty falling asleep.  Difficulty staying asleep.  Waking up too early in the morning. Any type of insomnia can be long-term (chronic) or short-term (acute). Both are common. Short-term insomnia usually lasts for three months or less. Chronic insomnia occurs at least three times a week for longer than three months. What are the causes? Insomnia may be caused by another condition, situation, or substance, such as:  Anxiety.  Certain medicines.  Gastroesophageal reflux disease (GERD) or other gastrointestinal conditions.  Asthma or other breathing conditions.  Restless legs syndrome, sleep apnea, or other sleep disorders.  Chronic pain.  Menopause.  Stroke.  Abuse of alcohol, tobacco, or illegal drugs.  Mental health conditions, such as depression.  Caffeine.  Neurological disorders, such as Alzheimer's disease.  An overactive thyroid (hyperthyroidism). Sometimes, the cause of insomnia may not be known. What increases the risk? Risk factors for insomnia include:  Gender. Women are affected more often than men.  Age. Insomnia is more common as you get older.  Stress.  Lack of  exercise.  Irregular work schedule or working night shifts.  Traveling between different time zones.  Certain medical and mental health conditions. What are the signs or symptoms? If you have insomnia, the main symptom is having trouble falling asleep or having trouble staying asleep. This may lead to other symptoms, such as:  Feeling fatigued or having low energy.  Feeling nervous about going to sleep.  Not feeling rested in the morning.  Having trouble concentrating.  Feeling irritable, anxious, or depressed. How is this diagnosed? This condition may be diagnosed based on:  Your symptoms and medical history. Your health care provider may ask about: ? Your sleep habits. ? Any medical conditions you have. ? Your mental health.  A physical exam. How is this treated? Treatment for insomnia depends on the cause. Treatment may focus on treating an underlying condition that is causing insomnia. Treatment may also include:  Medicines to help you sleep.  Counseling or therapy.  Lifestyle adjustments to help you sleep better. Follow these instructions at home: Eating and drinking   Limit or avoid alcohol, caffeinated beverages, and cigarettes, especially close to bedtime. These can disrupt your sleep.  Do not eat a large meal or eat spicy foods right before bedtime. This can lead to digestive discomfort that can make it hard for you to sleep. Sleep habits   Keep a sleep diary to help you and your health care provider figure out what could be causing your insomnia. Write down: ? When you sleep. ? When you wake up during the night. ? How well you sleep. ? How rested you feel the next day. ? Any side effects of medicines you are taking. ? What you eat and drink.  Make your bedroom a dark, comfortable place where it is easy to fall asleep. ? Put up shades or blackout curtains to block light from outside. ? Use a white noise machine to block noise. ? Keep the temperature  cool.  Limit screen use before bedtime. This includes: ? Watching TV. ? Using your smartphone, tablet, or computer.  Stick to a routine that includes going to bed and waking up at the same times every day and night. This can help you fall asleep faster. Consider making a quiet activity, such as reading, part of your nighttime routine.  Try to avoid taking naps during the day so that you sleep better at night.  Get out of bed if you are still awake after 15 minutes of trying to sleep. Keep the lights down, but try reading or doing a quiet activity. When you feel sleepy, go back to bed. General instructions  Take over-the-counter and prescription medicines only as told by your health care provider.  Exercise regularly, as told by your health care provider. Avoid exercise starting several hours before bedtime.  Use relaxation techniques to manage stress. Ask your health care provider to suggest some techniques that may work well for you. These may include: ? Breathing exercises. ? Routines to release muscle tension. ? Visualizing peaceful scenes.  Make sure that  you drive carefully. Avoid driving if you feel very sleepy.  Keep all follow-up visits as told by your health care provider. This is important. Contact a health care provider if:  You are tired throughout the day.  You have trouble in your daily routine due to sleepiness.  You continue to have sleep problems, or your sleep problems get worse. Get help right away if:  You have serious thoughts about hurting yourself or someone else. If you ever feel like you may hurt yourself or others, or have thoughts about taking your own life, get help right away. You can go to your nearest emergency department or call:  Your local emergency services (911 in the [place]  A suicide crisis helpline, such as the [place] at 970-842-6782. This is open 24 hours a day. Summary  Insomnia is a sleep disorder that  makes it difficult to fall asleep or stay asleep.  Insomnia can be long-term (chronic) or short-term (acute).  Treatment for insomnia depends on the cause. Treatment may focus on treating an underlying condition that is causing insomnia.  Keep a sleep diary to help you and your health care provider figure out what could be causing your insomnia. This information is not intended to replace advice given to you by your health care provider. Make sure you discuss any questions you have with your health care provider. Document Revised: 11/20/2017 Document Reviewed: 09/17/2017 Elsevier Patient Education  2020 [employer].

## 2020-11-20 DIAGNOSIS — M546 Pain in thoracic spine: Secondary | ICD-10-CM | POA: Diagnosis not present

## 2020-11-20 DIAGNOSIS — M545 Low back pain, unspecified: Secondary | ICD-10-CM | POA: Diagnosis not present

## 2020-11-27 DIAGNOSIS — M545 Low back pain, unspecified: Secondary | ICD-10-CM | POA: Diagnosis not present

## 2020-11-28 ENCOUNTER — Telehealth: Payer: Self-pay | Admitting: Internal Medicine

## 2020-11-28 ENCOUNTER — Other Ambulatory Visit: Payer: Self-pay | Admitting: Internal Medicine

## 2020-11-28 NOTE — Telephone Encounter (Signed)
Noted  

## 2020-11-28 NOTE — Telephone Encounter (Signed)
Christina Myers is calling and wanted to let provider know that PA for SYNTHROID 100 MCG tablet was approved with approval date of 11/28/2020-11/28/2021. CB [id number] opt 5

## 2020-11-29 DIAGNOSIS — M545 Low back pain, unspecified: Secondary | ICD-10-CM | POA: Diagnosis not present

## 2020-12-03 ENCOUNTER — Ambulatory Visit: Payer: Medicare Other | Admitting: Family Medicine

## 2020-12-03 ENCOUNTER — Encounter: Payer: Self-pay | Admitting: Family Medicine

## 2020-12-03 ENCOUNTER — Other Ambulatory Visit: Payer: Self-pay

## 2020-12-03 VITALS — BP 136/78 | HR 70 | Temp 97.9°F | Wt 129.0 lb

## 2020-12-03 DIAGNOSIS — W19XXXD Unspecified fall, subsequent encounter: Secondary | ICD-10-CM

## 2020-12-03 DIAGNOSIS — R109 Unspecified abdominal pain: Secondary | ICD-10-CM

## 2020-12-03 NOTE — Patient Instructions (Signed)
Abdominal Pain, Adult Many things can cause belly (abdominal) pain. Most times, belly pain is not dangerous. Many cases of belly pain can be watched and treated at home. Sometimes, though, belly pain is serious. Your doctor will try to find the cause of your belly pain. Follow these instructions at home:  Medicines  Take over-the-counter and prescription medicines only as told by your doctor.  Do not take medicines that help you poop (laxatives) unless told by your doctor. General instructions  Watch your belly pain for any changes.  Drink enough fluid to keep your pee (urine) pale yellow.  Keep all follow-up visits as told by your doctor. This is important. Contact a doctor if:  Your belly pain changes or gets worse.  You are not hungry, or you lose weight without trying.  You are having trouble pooping (constipated) or have watery poop (diarrhea) for more than 2-3 days.  You have pain when you pee or poop.  Your belly pain wakes you up at night.  Your pain gets worse with meals, after eating, or with certain foods.  You are vomiting and cannot keep anything down.  You have a fever.  You have blood in your pee. Get help right away if:  Your pain does not go away as soon as your doctor says it should.  You cannot stop vomiting.  Your pain is only in areas of your belly, such as the right side or the left lower part of the belly.  You have bloody or black poop, or poop that looks like tar.  You have very bad pain, cramping, or bloating in your belly.  You have signs of not having enough fluid or water in your body (dehydration), such as: ? Dark pee, very little pee, or no pee. ? Cracked lips. ? Dry mouth. ? Sunken eyes. ? Sleepiness. ? Weakness.  You have trouble breathing or chest pain. Summary  Many cases of belly pain can be watched and treated at home.  Watch your belly pain for any changes.  Take over-the-counter and prescription medicines only as  told by your doctor.  Contact a doctor if your belly pain changes or gets worse.  Get help right away if you have very bad pain, cramping, or bloating in your belly. This information is not intended to replace advice given to you by your health care provider. Make sure you discuss any questions you have with your health care provider. Document Revised: 04/18/2019 Document Reviewed: 04/18/2019 Elsevier Patient Education  [place].  Constipation, Adult Constipation is when a person:  Poops (has a bowel movement) fewer times in a week than normal.  Has a hard time pooping.  Has poop that is dry, hard, or bigger than normal. Follow these instructions at home: Eating and drinking   Eat foods that have a lot of fiber, such as: ? Fresh fruits and vegetables. ? Whole grains. ? Beans.  Eat less of foods that are high in fat, low in fiber, or overly processed, such as: ? [place] fries. ? Hamburgers. ? Cookies. ? Candy. ? Soda.  Drink enough fluid to keep your pee (urine) clear or pale yellow. General instructions  Exercise regularly or as told by your doctor.  Go to the restroom when you feel like you need to poop. Do not hold it in.  Take over-the-counter and prescription medicines only as told by your doctor. These include any fiber supplements.  Do pelvic floor retraining exercises, such as: ? Doing deep  breathing while relaxing your lower belly (abdomen). ? Relaxing your pelvic floor while pooping.  Watch your condition for any changes.  Keep all follow-up visits as told by your doctor. This is important. Contact a doctor if:  You have pain that gets worse.  You have a fever.  You have not pooped for 4 days.  You throw up (vomit).  You are not hungry.  You lose weight.  You are bleeding from the anus.  You have thin, pencil-like poop (stool). Get help right away if:  You have a fever, and your symptoms suddenly get worse.  You leak poop or have  blood in your poop.  Your belly feels hard or bigger than normal (is bloated).  You have very bad belly pain.  You feel dizzy or you faint. This information is not intended to replace advice given to you by your health care provider. Make sure you discuss any questions you have with your health care provider. Document Revised: 11/20/2017 Document Reviewed: 05/28/2016 Elsevier Patient Education  2020 [employer].

## 2020-12-03 NOTE — Progress Notes (Addendum)
Subjective:    Patient ID: Christina Myers, female    DOB: 07/26/1941, 79 y.o.   MRN: [id number]  No chief complaint on file.   HPI Patient was seen today for acute concern.  Patient endorses left-sided pain under her rib cage.  Feeling is described as "bad indigestion" but before food.  Pain resolved this morning.  Pt endorses fall while in the garage, landing on low back/butt 2 wks ago.  Pt seen by Ortho for this.  Had negative xrays.  Requested MRI for continued pain which was also negative.  Pt taking hydrocodone daily.  Using a fiber supplement in her coffee daily.  Denies having to strain with BMs.  Past Medical History:  Diagnosis Date   COLONIC POLYPS, HX OF 08/30/2007   Qualifier: Diagnosis of  By: Rogue Bussing CMA, Jacqualynn     Hx of colonic polyps    Hypertension    Hypothyroidism    OSTEOARTHRITIS, HAND 04/24/2009   Qualifier: Diagnosis of  By: Regis Bill MD, Standley Brooking    PVC (premature ventricular contraction)    [facility] evaluation 7/03   Seasonal allergies    Skin cancer    hx of bcca of face   SKIN CANCER, HX OF 02/02/2008   Qualifier: Diagnosis of  By: Regis Bill MD, Standley Brooking     Allergies  Allergen Reactions   Amoxicillin-Pot Clavulanate     REACTION: colitis  after ceclor  adn augmentin years ago   Cefdinir     REACTION: "colitis" diarrhea    ROS General: Denies fever, chills, night sweats, changes in weight, changes in appetite HEENT: Denies headaches, ear pain, changes in vision, rhinorrhea, sore throat CV: Denies CP, palpitations, SOB, orthopnea Pulm: Denies SOB, cough, wheezing GI: Denies abdominal pain, nausea, vomiting, diarrhea, constipation GU: Denies dysuria, hematuria, frequency, vaginal discharge Msk: Denies muscle cramps, joint pains  + low back pain, left lower rib cage pain Neuro: Denies weakness, numbness, tingling Skin: Denies rashes, bruising Psych: Denies depression, anxiety, hallucinations     Objective:    Blood pressure 136/78, pulse  70, temperature 97.9 F (36.6 C), temperature source Oral, weight 129 lb (58.5 kg), SpO2 99 %.  Gen. Pleasant, well-nourished, in no distress, normal affect HEENT: [place]/AT, face symmetric, conjunctiva clear, no scleral icterus, PERRLA, EOMI, nares patent without drainage Lungs: no accessory muscle use, CTAB, no wheezes or rales Cardiovascular: RRR, no m/r/g, no peripheral edema Abdomen: BS present, soft, NT/ND Musculoskeletal: No deformities.  No TTP of left flank or chest wall.  No cyanosis or clubbing, normal tone Neuro:  A&Ox3, CN II-XII intact, normal gait Skin:  Warm, no lesions/ rash.  No ecchymosis.   Wt Readings from Last 3 Encounters:  11/11/19 130 lb (59 kg)  12/29/18 130 lb (59 kg)  08/13/18 126 lb (57.2 kg)    Lab Results  Component Value Date   WBC 5.4 12/28/2019   HGB 13.7 12/28/2019   HCT 40.3 12/28/2019   PLT 260.0 12/28/2019   GLUCOSE 99 12/28/2019   CHOL 190 12/28/2019   TRIG 260.0 (H) 12/28/2019   HDL 48.90 12/28/2019   LDLDIRECT 103.0 12/28/2019   LDLCALC 83 11/19/2016   ALT 13 12/28/2019   AST 16 12/28/2019   NA 141 12/28/2019   K 4.3 12/28/2019   CL 106 12/28/2019   CREATININE 0.77 12/28/2019   BUN 16 12/28/2019   CO2 28 12/28/2019   TSH 0.63 12/28/2019   INR 1.0 ratio 11/27/2010    Assessment/Plan:  Left sided abdominal pain -  Possibly 2/2 muscle strain.  Also consider constipation given recent opioid use. -Continue supportive care -Continue to monitor  Fall from standing, subsequent encounter -Imaging negative per patient.  Not available for viewing in chart -Given precautions -Continue follow-up with Ortho -Advised to use hydrocodone sparingly  F/u with PCP as needed for continued or worsening symptoms.  Grier Mitts, MD

## 2020-12-27 DIAGNOSIS — M545 Low back pain, unspecified: Secondary | ICD-10-CM | POA: Diagnosis not present

## 2020-12-28 DIAGNOSIS — R102 Pelvic and perineal pain: Secondary | ICD-10-CM | POA: Diagnosis not present

## 2020-12-28 DIAGNOSIS — N951 Menopausal and female climacteric states: Secondary | ICD-10-CM | POA: Diagnosis not present

## 2020-12-28 DIAGNOSIS — N952 Postmenopausal atrophic vaginitis: Secondary | ICD-10-CM | POA: Diagnosis not present

## 2021-01-04 ENCOUNTER — Other Ambulatory Visit: Payer: Self-pay

## 2021-01-04 ENCOUNTER — Other Ambulatory Visit (INDEPENDENT_AMBULATORY_CARE_PROVIDER_SITE_OTHER): Payer: Medicare Other

## 2021-01-04 DIAGNOSIS — E782 Mixed hyperlipidemia: Secondary | ICD-10-CM

## 2021-01-04 DIAGNOSIS — E039 Hypothyroidism, unspecified: Secondary | ICD-10-CM

## 2021-01-04 DIAGNOSIS — Z79899 Other long term (current) drug therapy: Secondary | ICD-10-CM | POA: Diagnosis not present

## 2021-01-04 DIAGNOSIS — Z87440 Personal history of urinary (tract) infections: Secondary | ICD-10-CM

## 2021-01-04 DIAGNOSIS — K589 Irritable bowel syndrome without diarrhea: Secondary | ICD-10-CM

## 2021-01-04 LAB — CBC WITH DIFFERENTIAL/PLATELET
Basophils Absolute: 0 10*3/uL (ref 0.0–0.1)
Basophils Relative: 0.4 % (ref 0.0–3.0)
Eosinophils Absolute: 0.2 10*3/uL (ref 0.0–0.7)
Eosinophils Relative: 2.1 % (ref 0.0–5.0)
HCT: 40.2 % (ref 36.0–46.0)
Hemoglobin: 13.3 g/dL (ref 12.0–15.0)
Lymphocytes Relative: 29 % (ref 12.0–46.0)
Lymphs Abs: 2.2 10*3/uL (ref 0.7–4.0)
MCHC: 33 g/dL (ref 30.0–36.0)
MCV: 92.8 fl (ref 78.0–100.0)
Monocytes Absolute: 0.6 10*3/uL (ref 0.1–1.0)
Monocytes Relative: 7.5 % (ref 3.0–12.0)
Neutro Abs: 4.6 10*3/uL (ref 1.4–7.7)
Neutrophils Relative %: 61 % (ref 43.0–77.0)
Platelets: 309 10*3/uL (ref 150.0–400.0)
RBC: 4.33 Mil/uL (ref 3.87–5.11)
RDW: 15.3 % (ref 11.5–15.5)
WBC: 7.6 10*3/uL (ref 4.0–10.5)

## 2021-01-04 LAB — BASIC METABOLIC PANEL
BUN: 22 mg/dL (ref 6–23)
CO2: 27 mEq/L (ref 19–32)
Calcium: 8.8 mg/dL (ref 8.4–10.5)
Chloride: 106 mEq/L (ref 96–112)
Creatinine, Ser: 0.84 mg/dL (ref 0.40–1.20)
GFR: 65.1 mL/min (ref >60.00)
Glucose, Bld: 93 mg/dL (ref 70–99)
Potassium: 4.1 mEq/L (ref 3.5–5.1)
Sodium: 140 mEq/L (ref 135–145)

## 2021-01-04 LAB — HEPATIC FUNCTION PANEL
ALT: 14 U/L (ref 0–35)
AST: 14 U/L (ref 0–37)
Albumin: 4.2 g/dL (ref 3.5–5.2)
Alkaline Phosphatase: 107 U/L (ref 39–117)
Bilirubin, Direct: 0.1 mg/dL (ref 0.0–0.3)
Total Bilirubin: 0.4 mg/dL (ref 0.2–1.2)
Total Protein: 6.3 g/dL (ref 6.0–8.3)

## 2021-01-04 LAB — TSH: TSH: 1.25 u[IU]/mL (ref 0.35–4.50)

## 2021-01-04 LAB — LDL CHOLESTEROL, DIRECT: Direct LDL: 84 mg/dL

## 2021-01-04 LAB — LIPID PANEL
Cholesterol: 182 mg/dL (ref 0–200)
HDL: 44.2 mg/dL (ref >39.00)
NonHDL: 138.11
Total CHOL/HDL Ratio: 4
Triglycerides: 387 mg/dL — ABNORMAL HIGH (ref 0.0–149.0)
VLDL: 77.4 mg/dL — ABNORMAL HIGH (ref 0.0–40.0)

## 2021-01-04 LAB — T4, FREE: Free T4: 0.88 ng/dL (ref 0.60–1.60)

## 2021-01-07 NOTE — Progress Notes (Signed)
Will discuss at upcoming [facility] stable

## 2021-01-08 ENCOUNTER — Encounter: Payer: Medicare Other | Admitting: Internal Medicine

## 2021-01-08 DIAGNOSIS — R140 Abdominal distension (gaseous): Secondary | ICD-10-CM | POA: Diagnosis not present

## 2021-01-08 DIAGNOSIS — R102 Pelvic and perineal pain: Secondary | ICD-10-CM | POA: Diagnosis not present

## 2021-01-08 NOTE — Progress Notes (Deleted)
No chief complaint on file.   HPI: Christina Myers 80 y.o. comes in today for Preventive Medicare exam/ wellness visit .Since last visit.  Health Maintenance  Topic Date Due  . TETANUS/TDAP  12/22/2018  . INFLUENZA VACCINE  Completed  . DEXA SCAN  Completed  . COVID-19 Vaccine  Completed  . PNA vac Low Risk Adult  Completed   Health Maintenance Review LIFESTYLE:  Exercise:   Tobacco/ETS: Alcohol:  Sugar beverages: Sleep: Drug use: no HH:      Hearing:   Vision:  No limitations at present . Last eye check UTD  Safety:  Has smoke detector and wears seat belts.  No firearms. No excess sun exposure. Sees dentist regularly.  Falls:   Advance directive :  Reviewed  Has one.  Memory: Felt to be good  , no concern from her or her family.  Depression: No anhedonia unusual crying or depressive symptoms  Nutrition: Eats well balanced diet; adequate calcium and vitamin D. No swallowing chewing problems.  Injury: no major injuries in the last six months.  Other healthcare providers:  Reviewed today s.   Preventive parameters: up-to-date  Reviewed   ADLS:   There are no problems or need for assistance  driving, feeding, obtaining food, dressing, toileting and bathing, managing money using phone. She is independent.    ROS:  GEN/ HEENT: No fever, significant weight changes sweats headaches vision problems hearing changes, CV/ PULM; No chest pain shortness of breath cough, syncope,edema  change in exercise tolerance. GI /GU: No adominal pain, vomiting, change in bowel habits. No blood in the stool. No significant GU symptoms. SKIN/HEME: ,no acute skin rashes suspicious lesions or bleeding. No lymphadenopathy, nodules, masses.  NEURO/ PSYCH:  No neurologic signs such as weakness numbness. No depression anxiety. IMM/ Allergy: No unusual infections.  Allergy .   REST of 12 system review negative except as per HPI   Past Medical History:  Diagnosis Date  . COLONIC  POLYPS, HX OF 08/30/2007   Qualifier: Diagnosis of  By: Rogue Bussing CMA, Maryann Alar    . Hx of colonic polyps   . Hypertension   . Hypothyroidism   . OSTEOARTHRITIS, HAND 04/24/2009   Qualifier: Diagnosis of  By: Regis Bill MD, Standley Brooking   . PVC (premature ventricular contraction)    [facility] evaluation 7/03  . Seasonal allergies   . Skin cancer    hx of bcca of face  . SKIN CANCER, HX OF 02/02/2008   Qualifier: Diagnosis of  By: Regis Bill MD, Standley Brooking     Family History  Problem Relation Age of Onset  . Cancer Other        colon, prostate   . Diabetes Other   . Stroke Other   . Heart disease Other   . Colon cancer Mother     Social History   Socioeconomic History  . Marital status: Married    Spouse name: Not on file  . Number of children: Not on file  . Years of education: Not on file  . Highest education level: Not on file  Occupational History  . Not on file  Tobacco Use  . Smoking status: Former Research scientist (life sciences)  . Smokeless tobacco: Never Used  Substance and Sexual Activity  . Alcohol use: Yes    Comment: rarely  . Drug use: No  . Sexual activity: Not on file  Other Topics Concern  . Not on file  Social History Narrative   Married    Former smoker  Plays tennis and teaches tap dancing.    No etoh   HH of 2    Social Determinants of Health   Financial Resource Strain: Low Risk   . Difficulty of Paying Living Expenses: Not hard at all  Food Insecurity: No Food Insecurity  . Worried About Charity fundraiser in the Last Year: Never true  . Ran Out of Food in the Last Year: Never true  Transportation Needs: No Transportation Needs  . Lack of Transportation (Medical): No  . Lack of Transportation (Non-Medical): No  Physical Activity: Inactive  . Days of Exercise per Week: 0 days  . Minutes of Exercise per Session: 0 min  Stress: No Stress Concern Present  . Feeling of Stress : Not at all  Social Connections: Socially Integrated  . Frequency of Communication with Friends and  Family: More than three times a week  . Frequency of Social Gatherings with Friends and Family: More than three times a week  . Attends Religious Services: More than 4 times per year  . Active Member of Clubs or Organizations: Yes  . Attends Archivist Meetings: 1 to 4 times per year  . Marital Status: Married    Outpatient Encounter Medications as of 01/08/2021  Medication Sig  . aspirin 81 MG tablet Take 81 mg by mouth daily.    . Cholecalciferol (VITAMIN D3) 2000 units TABS Take 2,000 Units by mouth daily.  . Eluxadoline (VIBERZI) 75 MG TABS Take by mouth as needed.  [name] estradiol (ESTRACE) 0.1 MG/GM vaginal cream 1 -2 gram intravaginally 2  x per week  . simvastatin (ZOCOR) 20 MG tablet TAKE 1 TABLET(20 MG) BY MOUTH AT BEDTIME  . SYNTHROID 100 MCG tablet TAKE 1 TABLET BY MOUTH DAILY  . tobramycin (TOBREX) 0.3 % ophthalmic solution    No facility-administered encounter medications on file as of 01/08/2021.    EXAM:  There were no vitals taken for this visit.  There is no height or weight on file to calculate BMI.  Physical Exam: Vital signs reviewed [id number] is a well-developed well-nourished alert cooperative   who appears stated age in no acute distress.  HEENT: normocephalic atraumatic , Eyes: PERRL EOM's full, conjunctiva clear, Nares: paten,t no deformity discharge or tenderness., Ears: no deformity EAC's clear TMs with normal landmarks. Mouth:masked NECK: supple without masses, thyromegaly or bruits. CHEST/PULM:  Clear to auscultation and percussion breath sounds equal no wheeze , rales or rhonchi. No chest wall deformities or tenderness. CV: PMI is nondisplaced, S1 S2 no gallops, murmurs, rubs. Peripheral pulses are full without delay.No JVD .  ABDOMEN: Bowel sounds normal nontender  No guard or rebound, no hepato splenomegal no CVA tenderness.   Extremtities:  No clubbing cyanosis or edema, no acute joint swelling or redness no focal atrophy NEURO:  Oriented x3,  cranial nerves 3-12 appear to be intact, no obvious focal weakness,gait within normal limits no abnormal reflexes or asymmetrical SKIN: No acute rashes normal turgor, color, no bruising or petechiae. PSYCH: Oriented, good eye contact, no obvious depression anxiety, cognition and judgment appear normal. LN: no cervical axillary inguinal adenopathy No noted deficits in memory, attention, and speech.   Lab Results  Component Value Date   WBC 7.6 01/04/2021   HGB 13.3 01/04/2021   HCT 40.2 01/04/2021   PLT 309.0 01/04/2021   GLUCOSE 93 01/04/2021   CHOL 182 01/04/2021   TRIG 387.0 (H) 01/04/2021   HDL 44.20 01/04/2021   LDLDIRECT 84.0 01/04/2021   [place]  83 11/19/2016   ALT 14 01/04/2021   AST 14 01/04/2021   NA 140 01/04/2021   K 4.1 01/04/2021   CL 106 01/04/2021   CREATININE 0.84 01/04/2021   BUN 22 01/04/2021   CO2 27 01/04/2021   TSH 1.25 01/04/2021   INR 1.0 ratio 11/27/2010    ASSESSMENT AND PLAN:  Discussed the following assessment and plan:  Medication management  Visit for preventive health examination  Hypothyroidism, unspecified type  Patient Care Team: Burnis Medin, MD as PCP - Sandford Craze, MD (Dermatology) Richmond Campbell, MD (Gastroenterology) Elsie Saas, MD (Orthopedic Surgery) Sherlynn Stalls, MD as Consulting Physician (Ophthalmology) [place], [place], [place] (Optometry) [name], [facility] as Pharmacist (Pharmacist)  There are no Patient Instructions on file for this visit.  Standley Brooking. Trellis Guirguis M.D.

## 2021-01-08 NOTE — Progress Notes (Signed)
Mychart Message sent: Will discuss at upcoming [place] stable

## 2021-01-09 ENCOUNTER — Encounter: Payer: Medicare Other | Admitting: Internal Medicine

## 2021-01-09 ENCOUNTER — Ambulatory Visit (INDEPENDENT_AMBULATORY_CARE_PROVIDER_SITE_OTHER): Payer: Medicare Other | Admitting: Internal Medicine

## 2021-01-09 ENCOUNTER — Other Ambulatory Visit: Payer: Self-pay

## 2021-01-09 ENCOUNTER — Encounter: Payer: Self-pay | Admitting: Internal Medicine

## 2021-01-09 VITALS — BP 120/62 | HR 71 | Temp 98.0°F | Ht 60.5 in

## 2021-01-09 DIAGNOSIS — E039 Hypothyroidism, unspecified: Secondary | ICD-10-CM | POA: Diagnosis not present

## 2021-01-09 DIAGNOSIS — Z79899 Other long term (current) drug therapy: Secondary | ICD-10-CM

## 2021-01-09 DIAGNOSIS — E782 Mixed hyperlipidemia: Secondary | ICD-10-CM | POA: Diagnosis not present

## 2021-01-09 DIAGNOSIS — H35423 Microcystoid degeneration of retina, bilateral: Secondary | ICD-10-CM | POA: Diagnosis not present

## 2021-01-09 DIAGNOSIS — H353222 Exudative age-related macular degeneration, left eye, with inactive choroidal neovascularization: Secondary | ICD-10-CM | POA: Diagnosis not present

## 2021-01-09 DIAGNOSIS — H353112 Nonexudative age-related macular degeneration, right eye, intermediate dry stage: Secondary | ICD-10-CM | POA: Diagnosis not present

## 2021-01-09 DIAGNOSIS — H43813 Vitreous degeneration, bilateral: Secondary | ICD-10-CM | POA: Diagnosis not present

## 2021-01-09 DIAGNOSIS — Z0000 Encounter for general adult medical examination without abnormal findings: Secondary | ICD-10-CM | POA: Diagnosis not present

## 2021-01-09 NOTE — Progress Notes (Signed)
Chief Complaint  Patient presents with  . Annual Exam    HPI: Christina Myers 80 y.o. comes in today for Preventive Medicare exam/ wellness reschedule cause of weather delay   THyroid on meds   HLD  Taking med simvastatin Hx of fall .  In garage.  Cement . 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 About  Gyne cancer pancreatic  Neg fam hx  Ok now Had colonscopy ok but had post aspiration pna  And better now  Dr [name]   Health Maintenance  Topic Date Due  . [name]  12/22/2018  . INFLUENZA VACCINE  Completed  . DEXA SCAN  Completed  . COVID-19 Vaccine  Completed  . PNA vac Low Risk Adult  Completed   Health Maintenance Review LIFESTYLE:  Exercise:  DANCE TEACHING TAP Tobacco/ETS:n Alcohol: rARE Sugar beverages:N Sleep:Y Drug use: no HH:2 cat     Hearing:  Ok   Vision:  No limitations at present . Last eye check UTD  Safety:  Has smoke detector and wears seat belts.  . No excess sun exposure. Sees dentist regularly.  Falls: SEE ABOVE   Memory: Felt to be good  , no concern from her or her family.  Depression: No anhedonia unusual crying or depressive symptoms  Nutrition: Eats well balanced diet; adequate calcium and vitamin D. No swallowing chewing problems.  Preventive parameters: up-to-date  Reviewed   ADLS:   There are no problems or need for assistance  driving, feeding, obtaining food, dressing, toileting and bathing, managing money using phone. She is independent.   ROS:  GEN/ HEENT: No fever, significant weight changes sweats headaches vision problems hearing changes, CV/ PULM; No chest pain shortness of breath cough, syncope,edema  change in exercise tolerance. GI /GU: No adominal pain, vomiting, change in bowel habits. No blood in the stool. No significant GU symptoms. SKIN/HEME: ,no acute skin rashes suspicious lesions or  bleeding. No lymphadenopathy, nodules, masses.  NEURO/ PSYCH:  No neurologic signs such as weakness numbness. No depression anxiety. IMM/ Allergy: No unusual infections.  Allergy .   REST of 12 system review negative except as per HPI   Past Medical History:  Diagnosis Date  . COLONIC POLYPS, HX OF 08/30/2007   Qualifier: Diagnosis of  By: Rogue Bussing CMA, Maryann Alar    . Hx of colonic polyps   . Hypertension   . Hypothyroidism   . OSTEOARTHRITIS, HAND 04/24/2009   Qualifier: Diagnosis of  By: Regis Bill MD, Standley Brooking   . PVC (premature ventricular contraction)    [facility] evaluation 7/03  . Seasonal allergies   . Skin cancer    hx of bcca of face  . SKIN CANCER, HX OF 02/02/2008   Qualifier: Diagnosis of  By: Regis Bill MD, Standley Brooking     Family History  Problem Relation Age of Onset  . Cancer Other        colon, prostate   . Diabetes Other   . Stroke Other   . Heart disease Other   . Colon cancer Mother     Social History   Socioeconomic History  . Marital status: Married    Spouse name: Not on file  . Number of children: Not on file  . Years of education: Not on file  . Highest education level:  Not on file  Occupational History  . Not on file  Tobacco Use  . Smoking status: Former Research scientist (life sciences)  . Smokeless tobacco: Never Used  Substance and Sexual Activity  . Alcohol use: Yes    Comment: rarely  . Drug use: No  . Sexual activity: Not on file  Other Topics Concern  . Not on file  Social History Narrative   Married    Former smoker   Set designer and teaches tap dancing.    No etoh   HH of 2    Social Determinants of Health   Financial Resource Strain: Low Risk   . Difficulty of Paying Living Expenses: Not hard at all  Food Insecurity: No Food Insecurity  . Worried About Charity fundraiser in the Last Year: Never true  . Ran Out of Food in the Last Year: Never true  Transportation Needs: No Transportation Needs  . Lack of Transportation (Medical): No  . Lack of  Transportation (Non-Medical): No  Physical Activity: Inactive  . Days of Exercise per Week: 0 days  . Minutes of Exercise per Session: 0 min  Stress: No Stress Concern Present  . Feeling of Stress : Not at all  Social Connections: Socially Integrated  . Frequency of Communication with Friends and Family: More than three times a week  . Frequency of Social Gatherings with Friends and Family: More than three times a week  . Attends Religious Services: More than 4 times per year  . Active Member of Clubs or Organizations: Yes  . Attends Archivist Meetings: 1 to 4 times per year  . Marital Status: Married    Outpatient Encounter Medications as of 01/09/2021  Medication Sig  . aspirin 81 MG tablet Take 81 mg by mouth daily.  . Cholecalciferol (VITAMIN D3) 2000 units TABS Take 2,000 Units by mouth daily.  . Eluxadoline 75 MG TABS Take by mouth as needed.  [name] estradiol (ESTRACE) 0.1 MG/GM vaginal cream 1 -2 gram intravaginally 2  x per week  . simvastatin (ZOCOR) 20 MG tablet TAKE 1 TABLET(20 MG) BY MOUTH AT BEDTIME  . SYNTHROID 100 MCG tablet TAKE 1 TABLET BY MOUTH DAILY  . tobramycin (TOBREX) 0.3 % ophthalmic solution    No facility-administered encounter medications on file as of 01/09/2021.    EXAM:  BP 120/62 (BP Location: Left Arm, Patient Position: Sitting, Cuff Size: Normal)   Pulse 71   Temp 98 F (36.7 C) (Oral)   Ht 5' 0.5" (1.537 m)   SpO2 97%   BMI 24.78 kg/m   Body mass index is 24.78 kg/m.  Physical Exam: Vital signs reviewed SNK:NLZJ is a well-developed well-nourished alert cooperative   who appears stated age in no acute distress.  HEENT: normocephalic atraumatic , Eyes: PERRL EOM's full, conjunctiva clear, Nares: paten,t no deformity discharge or tenderness., Ears: no deformity EAC's clear TMs with normal landmarks. Mouthmasked  NECK: supple without masses, thyromegaly or bruits. CHEST/PULM:  Clear to auscultation and percussion breath sounds equal no  wheeze , rales or rhonchi. No chest wall deformities or tenderness. Breast no nodule or dc  CV: PMI is nondisplaced, S1 S2 no gallops, murmurs, rubs. Peripheral pulses are full without delay.No JVD . ABDOMEN: Bowel sounds normal nontender  No guard or rebound, no hepato splenomegal no CVA tenderness.   Extremtities:  No clubbing cyanosis or edema, no acute joint swelling or redness no focal atrophy NEURO:  Oriented x3, cranial nerves 3-12 appear to be intact, no  obvious focal weakness,gait within normal limits no abnormal reflexes or asymmetrical SKIN: No acute rashes normal turgor, color, no bruising or petechiae. PSYCH: Oriented, good eye contact, no obvious depression anxiety, cognition and judgment appear normal. LN: no cervical axillary inguinal adenopathy No noted deficits in memory, attention, and speech.   Lab Results  Component Value Date   WBC 7.6 01/04/2021   HGB 13.3 01/04/2021   HCT 40.2 01/04/2021   PLT 309.0 01/04/2021   GLUCOSE 93 01/04/2021   CHOL 182 01/04/2021   TRIG 387.0 (H) 01/04/2021   HDL 44.20 01/04/2021   LDLDIRECT 84.0 01/04/2021   LDLCALC 83 11/19/2016   ALT 14 01/04/2021   AST 14 01/04/2021   NA 140 01/04/2021   K 4.1 01/04/2021   CL 106 01/04/2021   CREATININE 0.84 01/04/2021   BUN 22 01/04/2021   CO2 27 01/04/2021   TSH 1.25 01/04/2021   INR 1.0 ratio 11/27/2010   Results:  Lumbar spine L1-L4 Femoral neck (FN)  T-score -0.8 RFN: -1.7 LFN: -1.2  Change in BMD from previous DXA test (%) n/a 5.3*  (*) statistically significant 2 18 2020 Lab review  ASSESSMENT AND PLAN:  Discussed the following assessment and plan:  Visit for preventive health examination  Hypothyroidism, unspecified type  Medication management  HYPERLIPIDEMIA - w elevated tg  - Plan: Lipid panel Suspect tg up cause of fam hx and less exercise after fall    Reviewed  Can chose to get  Repeat lipid  after lsi  Patient Care Team: Burnis Medin, MD as PCP -  Sandford Craze, MD (Dermatology) Richmond Campbell, MD (Gastroenterology) Elsie Saas, MD (Orthopedic Surgery) Sherlynn Stalls, MD as Consulting Physician (Ophthalmology) [place], Heather, Hardy (Optometry) [name], [facility] as Pharmacist (Pharmacist)  Patient Instructions   Limit simple sugars and processed carbohydrates .   And animal fats.    Lab Results  Component Value Date   WBC 7.6 01/04/2021   HGB 13.3 01/04/2021   HCT 40.2 01/04/2021   PLT 309.0 01/04/2021   GLUCOSE 93 01/04/2021   CHOL 182 01/04/2021   TRIG 387.0 (H) 01/04/2021   HDL 44.20 01/04/2021   LDLDIRECT 84.0 01/04/2021   LDLCALC 83 11/19/2016   ALT 14 01/04/2021   AST 14 01/04/2021   NA 140 01/04/2021   K 4.1 01/04/2021   CL 106 01/04/2021   CREATININE 0.84 01/04/2021   BUN 22 01/04/2021   CO2 27 01/04/2021   TSH 1.25 01/04/2021   INR 1.0 ratio 11/27/2010        Health Maintenance, Female Adopting a healthy lifestyle and getting preventive care are important in promoting health and wellness. Ask your health care provider about:  The right schedule for you to have regular tests and exams.  Things you can do on your own to prevent diseases and keep yourself healthy. What should I know about diet, weight, and exercise? Eat a healthy diet  Eat a diet that includes plenty of vegetables, fruits, low-fat dairy products, and lean protein.  Do not eat a lot of foods that are high in solid fats, added sugars, or sodium.   Maintain a healthy weight Body mass index (BMI) is used to identify weight problems. It estimates body fat based on height and weight. Your health care provider can help determine your BMI and help you achieve or maintain a healthy weight. Get regular exercise Get regular exercise. This is one of the most important things you can do for your health. Most  adults should:  Exercise for at least 150 minutes each week. The exercise should increase your heart rate and make you sweat  (moderate-intensity exercise).  Do strengthening exercises at least twice a week. This is in addition to the moderate-intensity exercise.  Spend less time sitting. Even light physical activity can be beneficial. Watch cholesterol and blood lipids Have your blood tested for lipids and cholesterol at 82 years of age, then have this test every 5 years. Have your cholesterol levels checked more often if:  Your lipid or cholesterol levels are high.  You are older than 82 years of age.  You are at high risk for heart disease. What should I know about cancer screening? Depending on your health history and family history, you may need to have cancer screening at various ages. This may include screening for:  Breast cancer.  Cervical cancer.  Colorectal cancer.  Skin cancer.  Lung cancer. What should I know about heart disease, diabetes, and high blood pressure? Blood pressure and heart disease  High blood pressure causes heart disease and increases the risk of stroke. This is more likely to develop in people who have high blood pressure readings, are of African descent, or are overweight.  Have your blood pressure checked: ? Every 3-5 years if you are 38-41 years of age. ? Every year if you are 41 years old or older. Diabetes Have regular diabetes screenings. This checks your fasting blood sugar level. Have the screening done:  Once every three years after age 39 if you are at a normal weight and have a low risk for diabetes.  More often and at a younger age if you are overweight or have a high risk for diabetes. What should I know about preventing infection? Hepatitis B If you have a higher risk for hepatitis B, you should be screened for this virus. Talk with your health care provider to find out if you are at risk for hepatitis B infection. Hepatitis C Testing is recommended for:  Everyone born from [birth year] through 1967.  Anyone with known risk factors for hepatitis  C. Sexually transmitted infections (STIs)  Get screened for STIs, including gonorrhea and chlamydia, if: ? You are sexually active and are younger than 82 years of age. ? You are older than 82 years of age and your health care provider tells you that you are at risk for this type of infection. ? Your sexual activity has changed since you were last screened, and you are at increased risk for chlamydia or gonorrhea. Ask your health care provider if you are at risk.  Ask your health care provider about whether you are at high risk for HIV. Your health care provider may recommend a prescription medicine to help prevent HIV infection. If you choose to take medicine to prevent HIV, you should first get tested for HIV. You should then be tested every 3 months for as long as you are taking the medicine. Pregnancy  If you are about to stop having your period (premenopausal) and you may become pregnant, seek counseling before you get pregnant.  Take 400 to 800 micrograms (mcg) of folic acid every day if you become pregnant.  Ask for birth control (contraception) if you want to prevent pregnancy. Osteoporosis and menopause Osteoporosis is a disease in which the bones lose minerals and strength with aging. This can result in bone fractures. If you are 60 years old or older, or if you are at risk for osteoporosis and fractures, ask your  health care provider if you should:  Be screened for bone loss.  Take a calcium or vitamin D supplement to lower your risk of fractures.  Be given hormone replacement therapy (HRT) to treat symptoms of menopause. Follow these instructions at home: Lifestyle  Do not use any products that contain nicotine or tobacco, such as cigarettes, e-cigarettes, and chewing tobacco. If you need help quitting, ask your health care provider.  Do not use street drugs.  Do not share needles.  Ask your health care provider for help if you need support or information about quitting  drugs. Alcohol use  Do not drink alcohol if: ? Your health care provider tells you not to drink. ? You are pregnant, may be pregnant, or are planning to become pregnant.  If you drink alcohol: ? Limit how much you use to 0-1 drink a day. ? Limit intake if you are breastfeeding.  Be aware of how much alcohol is in your drink. In the [place] one drink equals one 12 oz bottle of beer (355 mL), one 5 oz glass of wine (148 mL), or one 1 oz glass of hard liquor (44 mL). General instructions  Schedule regular health, dental, and eye exams.  Stay current with your vaccines.  Tell your health care provider if: ? You often feel depressed. ? You have ever been abused or do not feel safe at home. Summary  Adopting a healthy lifestyle and getting preventive care are important in promoting health and wellness.  Follow your health care provider's instructions about healthy diet, exercising, and getting tested or screened for diseases.  Follow your health care provider's instructions on monitoring your cholesterol and blood pressure. This information is not intended to replace advice given to you by your health care provider. Make sure you discuss any questions you have with your health care provider. Document Revised: 12/01/2018 Document Reviewed: 12/01/2018 Elsevier Patient Education  2021 Elsevier Inc.   High Triglycerides Eating Plan Triglycerides are a type of fat in the blood. High levels of triglycerides can increase your risk of heart disease and stroke. If your triglyceride levels are high, choosing the right foods can help lower your triglycerides and keep your heart healthy. Work with your health care provider or a diet and nutrition specialist (dietitian) to develop an eating plan that is right for you. What are tips for following this plan? General guidelines  Lose weight, if you are overweight. For most people, losing 5-10 lbs (2-5 kg) helps lower triglyceride levels. A  weight-loss plan may include. ? 30 minutes of exercise at least 5 days a week. ? Reducing the amount of calories, sugar, and fat you eat.  Eat a wide variety of fresh fruits, vegetables, and whole grains. These foods are high in fiber.  Eat foods that contain healthy fats, such as fatty fish, nuts, seeds, and olive oil.  Avoid foods that are high in added sugar, added salt (sodium), saturated fat, and trans fat.  Avoid low-fiber, refined carbohydrates such as white bread, crackers, noodles, and white rice.  Avoid foods with partially hydrogenated oils (trans fats), such as fried foods or stick margarine.  Limit alcohol intake to no more than 1 drink a day for nonpregnant women and 2 drinks a day for men. One drink equals 12 oz of beer, 5 oz of wine, or 1 oz of hard liquor. Your health care provider may recommend that you drink less depending on your overall health.   Reading food labels  Check food  labels for the amount of saturated fat. Choose foods with no or very little saturated fat.  Check food labels for the amount of trans fat. Choose foods with no trans fat.  Check food labels for the amount of cholesterol. Choose foods low in cholesterol. Ask your dietitian how much cholesterol you should have each day.  Check food labels for the amount of sodium. Choose foods with less than 140 milligrams (mg) per serving. Shopping  Buy dairy products labeled as nonfat (skim) or low-fat (1%).  Avoid buying processed or prepackaged foods. These are often high in added sugar, sodium, and fat. Cooking  Choose healthy fats when cooking, such as olive oil or canola oil.  Cook foods using lower fat methods, such as baking, broiling, boiling, or grilling.  Make your own sauces, dressings, and marinades when possible, instead of buying them. Store-bought sauces, dressings, and marinades are often high in sodium and sugar. Meal planning  Eat more home-cooked food and less restaurant, buffet,  and fast food.  Eat fatty fish at least 2 times each week. Examples of fatty fish include salmon, [place], mackerel, tuna, and herring.  If you eat whole eggs, do not eat more than 3 egg yolks per week. What foods are recommended? The items listed may not be a complete list. Talk with your dietitian about what dietary choices are best for you. Grains Whole wheat or whole grain breads, crackers, cereals, and pasta. Unsweetened oatmeal. Bulgur. Barley. Quinoa. Brown rice. Whole wheat flour tortillas. Vegetables Fresh or frozen vegetables. Low-sodium canned vegetables. Fruits All fresh, canned (in natural juice), or frozen fruits. Meats and other protein foods Skinless chicken or [place]. Ground chicken or [place]. Lean cuts of pork, trimmed of fat. Fish and seafood, especially salmon, [place], and herring. Egg whites. Dried beans, peas, or lentils. Unsalted nuts or seeds. Unsalted canned beans. Natural peanut or almond butter. Dairy Low-fat dairy products. Skim or low-fat (1%) milk. Reduced fat (2%) and low-sodium cheese. Low-fat ricotta cheese. Low-fat cottage cheese. Plain, low-fat yogurt. Fats and oils Tub margarine without trans fats. Light or reduced-fat mayonnaise. Light or reduced-fat salad dressings. Avocado. Safflower, olive, sunflower, soybean, and canola oils. What foods are not recommended? The items listed may not be a complete list. Talk with your dietitian about what dietary choices are best for you. Grains White bread. White (regular) pasta. White rice. Cornbread. Bagels. Pastries. Crackers that contain trans fat. Vegetables Creamed or fried vegetables. Vegetables in a cheese sauce. Fruits Sweetened dried fruit. Canned fruit in syrup. Fruit juice. Meats and other protein foods Fatty cuts of meat. Ribs. Chicken wings. Berniece Salines. Sausage. Bologna. Salami. Chitterlings. Fatback. Hot dogs. Bratwurst. Packaged lunch meats. Dairy Whole or reduced-fat (2%) milk. Half-and-half. Cream  cheese. Full-fat or sweetened yogurt. Full-fat cheese. Nondairy creamers. Whipped toppings. Processed cheese or cheese spreads. Cheese curds. Beverages Alcohol. Sweetened drinks, such as soda, lemonade, fruit drinks, or punches. Fats and oils Butter. Stick margarine. Lard. Shortening. Ghee. Bacon fat. Tropical oils, such as coconut, palm kernel, or palm oils. Sweets and desserts Corn syrup. Sugars. Honey. Molasses. Candy. Jam and jelly. Syrup. Sweetened cereals. Cookies. Pies. Cakes. Donuts. Muffins. Ice cream. Condiments Store-bought sauces, dressings, and marinades that are high in sugar, such as ketchup and barbecue sauce. Summary  High levels of triglycerides can increase the risk of heart disease and stroke. Choosing the right foods can help lower your triglycerides.  Eat plenty of fresh fruits, vegetables, and whole grains. Choose low-fat dairy and lean meats. Eat fatty fish  at least twice a week.  Avoid processed and prepackaged foods with added sugar, sodium, saturated fat, and trans fat.  If you need suggestions or have questions about what types of food are good for you, talk with your health care provider or a dietitian. This information is not intended to replace advice given to you by your health care provider. Make sure you discuss any questions you have with your health care provider. Document Revised: 04/11/2020 Document Reviewed: 04/11/2020 Elsevier Patient Education  2021 [place] K. Shaunee Mulkern M.D.

## 2021-01-09 NOTE — Patient Instructions (Addendum)
Limit simple sugars and processed carbohydrates .   And animal fats.    Lab Results  Component Value Date   WBC 7.6 01/04/2021   HGB 13.3 01/04/2021   HCT 40.2 01/04/2021   PLT 309.0 01/04/2021   GLUCOSE 93 01/04/2021   CHOL 182 01/04/2021   TRIG 387.0 (H) 01/04/2021   HDL 44.20 01/04/2021   LDLDIRECT 84.0 01/04/2021   LDLCALC 83 11/19/2016   ALT 14 01/04/2021   AST 14 01/04/2021   NA 140 01/04/2021   K 4.1 01/04/2021   CL 106 01/04/2021   CREATININE 0.84 01/04/2021   BUN 22 01/04/2021   CO2 27 01/04/2021   TSH 1.25 01/04/2021   INR 1.0 ratio 11/27/2010        Health Maintenance, Female Adopting a healthy lifestyle and getting preventive care are important in promoting health and wellness. Ask your health care provider about:  The right schedule for you to have regular tests and exams.  Things you can do on your own to prevent diseases and keep yourself healthy. What should I know about diet, weight, and exercise? Eat a healthy diet  Eat a diet that includes plenty of vegetables, fruits, low-fat dairy products, and lean protein.  Do not eat a lot of foods that are high in solid fats, added sugars, or sodium.   Maintain a healthy weight Body mass index (BMI) is used to identify weight problems. It estimates body fat based on height and weight. Your health care provider can help determine your BMI and help you achieve or maintain a healthy weight. Get regular exercise Get regular exercise. This is one of the most important things you can do for your health. Most adults should:  Exercise for at least 150 minutes each week. The exercise should increase your heart rate and make you sweat (moderate-intensity exercise).  Do strengthening exercises at least twice a week. This is in addition to the moderate-intensity exercise.  Spend less time sitting. Even light physical activity can be beneficial. Watch cholesterol and blood lipids Have your blood tested for lipids and  cholesterol at 82 years of age, then have this test every 5 years. Have your cholesterol levels checked more often if:  Your lipid or cholesterol levels are high.  You are older than 82 years of age.  You are at high risk for heart disease. What should I know about cancer screening? Depending on your health history and family history, you may need to have cancer screening at various ages. This may include screening for:  Breast cancer.  Cervical cancer.  Colorectal cancer.  Skin cancer.  Lung cancer. What should I know about heart disease, diabetes, and high blood pressure? Blood pressure and heart disease  High blood pressure causes heart disease and increases the risk of stroke. This is more likely to develop in people who have high blood pressure readings, are of African descent, or are overweight.  Have your blood pressure checked: ? Every 3-5 years if you are 56-71 years of age. ? Every year if you are 16 years old or older. Diabetes Have regular diabetes screenings. This checks your fasting blood sugar level. Have the screening done:  Once every three years after age 52 if you are at a normal weight and have a low risk for diabetes.  More often and at a younger age if you are overweight or have a high risk for diabetes. What should I know about preventing infection? Hepatitis B If you have a higher  risk for hepatitis B, you should be screened for this virus. Talk with your health care provider to find out if you are at risk for hepatitis B infection. Hepatitis C Testing is recommended for:  Everyone born from [birth year] through 1967.  Anyone with known risk factors for hepatitis C. Sexually transmitted infections (STIs)  Get screened for STIs, including gonorrhea and chlamydia, if: ? You are sexually active and are younger than 82 years of age. ? You are older than 82 years of age and your health care provider tells you that you are at risk for this type of  infection. ? Your sexual activity has changed since you were last screened, and you are at increased risk for chlamydia or gonorrhea. Ask your health care provider if you are at risk.  Ask your health care provider about whether you are at high risk for HIV. Your health care provider may recommend a prescription medicine to help prevent HIV infection. If you choose to take medicine to prevent HIV, you should first get tested for HIV. You should then be tested every 3 months for as long as you are taking the medicine. Pregnancy  If you are about to stop having your period (premenopausal) and you may become pregnant, seek counseling before you get pregnant.  Take 400 to 800 micrograms (mcg) of folic acid every day if you become pregnant.  Ask for birth control (contraception) if you want to prevent pregnancy. Osteoporosis and menopause Osteoporosis is a disease in which the bones lose minerals and strength with aging. This can result in bone fractures. If you are 49 years old or older, or if you are at risk for osteoporosis and fractures, ask your health care provider if you should:  Be screened for bone loss.  Take a calcium or vitamin D supplement to lower your risk of fractures.  Be given hormone replacement therapy (HRT) to treat symptoms of menopause. Follow these instructions at home: Lifestyle  Do not use any products that contain nicotine or tobacco, such as cigarettes, e-cigarettes, and chewing tobacco. If you need help quitting, ask your health care provider.  Do not use street drugs.  Do not share needles.  Ask your health care provider for help if you need support or information about quitting drugs. Alcohol use  Do not drink alcohol if: ? Your health care provider tells you not to drink. ? You are pregnant, may be pregnant, or are planning to become pregnant.  If you drink alcohol: ? Limit how much you use to 0-1 drink a day. ? Limit intake if you are  breastfeeding.  Be aware of how much alcohol is in your drink. In the [place] one drink equals one 12 oz bottle of beer (355 mL), one 5 oz glass of wine (148 mL), or one 1 oz glass of hard liquor (44 mL). General instructions  Schedule regular health, dental, and eye exams.  Stay current with your vaccines.  Tell your health care provider if: ? You often feel depressed. ? You have ever been abused or do not feel safe at home. Summary  Adopting a healthy lifestyle and getting preventive care are important in promoting health and wellness.  Follow your health care provider's instructions about healthy diet, exercising, and getting tested or screened for diseases.  Follow your health care provider's instructions on monitoring your cholesterol and blood pressure. This information is not intended to replace advice given to you by your health care provider. Make sure you discuss  any questions you have with your health care provider. Document Revised: 12/01/2018 Document Reviewed: 12/01/2018 Elsevier Patient Education  2021 [place].   High Triglycerides Eating Plan Triglycerides are a type of fat in the blood. High levels of triglycerides can increase your risk of heart disease and stroke. If your triglyceride levels are high, choosing the right foods can help lower your triglycerides and keep your heart healthy. Work with your health care provider or a diet and nutrition specialist (dietitian) to develop an eating plan that is right for you. What are tips for following this plan? General guidelines  Lose weight, if you are overweight. For most people, losing 5-10 lbs (2-5 kg) helps lower triglyceride levels. A weight-loss plan may include. ? 30 minutes of exercise at least 5 days a week. ? Reducing the amount of calories, sugar, and fat you eat.  Eat a wide variety of fresh fruits, vegetables, and whole grains. These foods are high in fiber.  Eat foods that contain healthy fats,  such as fatty fish, nuts, seeds, and olive oil.  Avoid foods that are high in added sugar, added salt (sodium), saturated fat, and trans fat.  Avoid low-fiber, refined carbohydrates such as white bread, crackers, noodles, and white rice.  Avoid foods with partially hydrogenated oils (trans fats), such as fried foods or stick margarine.  Limit alcohol intake to no more than 1 drink a day for nonpregnant women and 2 drinks a day for men. One drink equals 12 oz of beer, 5 oz of wine, or 1 oz of hard liquor. Your health care provider may recommend that you drink less depending on your overall health.   Reading food labels  Check food labels for the amount of saturated fat. Choose foods with no or very little saturated fat.  Check food labels for the amount of trans fat. Choose foods with no trans fat.  Check food labels for the amount of cholesterol. Choose foods low in cholesterol. Ask your dietitian how much cholesterol you should have each day.  Check food labels for the amount of sodium. Choose foods with less than 140 milligrams (mg) per serving. Shopping  Buy dairy products labeled as nonfat (skim) or low-fat (1%).  Avoid buying processed or prepackaged foods. These are often high in added sugar, sodium, and fat. Cooking  Choose healthy fats when cooking, such as olive oil or canola oil.  Cook foods using lower fat methods, such as baking, broiling, boiling, or grilling.  Make your own sauces, dressings, and marinades when possible, instead of buying them. Store-bought sauces, dressings, and marinades are often high in sodium and sugar. Meal planning  Eat more home-cooked food and less restaurant, buffet, and fast food.  Eat fatty fish at least 2 times each week. Examples of fatty fish include salmon, [place], mackerel, tuna, and herring.  If you eat whole eggs, do not eat more than 3 egg yolks per week. What foods are recommended? The items listed may not be a complete list.  Talk with your dietitian about what dietary choices are best for you. Grains Whole wheat or whole grain breads, crackers, cereals, and pasta. Unsweetened oatmeal. Bulgur. Barley. Quinoa. Brown rice. Whole wheat flour tortillas. Vegetables Fresh or frozen vegetables. Low-sodium canned vegetables. Fruits All fresh, canned (in natural juice), or frozen fruits. Meats and other protein foods Skinless chicken or [place]. Ground chicken or [place]. Lean cuts of pork, trimmed of fat. Fish and seafood, especially salmon, [place], and herring. Egg whites. Dried beans,  peas, or lentils. Unsalted nuts or seeds. Unsalted canned beans. Natural peanut or almond butter. Dairy Low-fat dairy products. Skim or low-fat (1%) milk. Reduced fat (2%) and low-sodium cheese. Low-fat ricotta cheese. Low-fat cottage cheese. Plain, low-fat yogurt. Fats and oils Tub margarine without trans fats. Light or reduced-fat mayonnaise. Light or reduced-fat salad dressings. Avocado. Safflower, olive, sunflower, soybean, and canola oils. What foods are not recommended? The items listed may not be a complete list. Talk with your dietitian about what dietary choices are best for you. Grains White bread. White (regular) pasta. White rice. Cornbread. Bagels. Pastries. Crackers that contain trans fat. Vegetables Creamed or fried vegetables. Vegetables in a cheese sauce. Fruits Sweetened dried fruit. Canned fruit in syrup. Fruit juice. Meats and other protein foods Fatty cuts of meat. Ribs. Chicken wings. Berniece Salines. Sausage. Bologna. Salami. Chitterlings. Fatback. Hot dogs. Bratwurst. Packaged lunch meats. Dairy Whole or reduced-fat (2%) milk. Half-and-half. Cream cheese. Full-fat or sweetened yogurt. Full-fat cheese. Nondairy creamers. Whipped toppings. Processed cheese or cheese spreads. Cheese curds. Beverages Alcohol. Sweetened drinks, such as soda, lemonade, fruit drinks, or punches. Fats and oils Butter. Stick margarine. Lard.  Shortening. Ghee. Bacon fat. Tropical oils, such as coconut, palm kernel, or palm oils. Sweets and desserts Corn syrup. Sugars. Honey. Molasses. Candy. Jam and jelly. Syrup. Sweetened cereals. Cookies. Pies. Cakes. Donuts. Muffins. Ice cream. Condiments Store-bought sauces, dressings, and marinades that are high in sugar, such as ketchup and barbecue sauce. Summary  High levels of triglycerides can increase the risk of heart disease and stroke. Choosing the right foods can help lower your triglycerides.  Eat plenty of fresh fruits, vegetables, and whole grains. Choose low-fat dairy and lean meats. Eat fatty fish at least twice a week.  Avoid processed and prepackaged foods with added sugar, sodium, saturated fat, and trans fat.  If you need suggestions or have questions about what types of food are good for you, talk with your health care provider or a dietitian. This information is not intended to replace advice given to you by your health care provider. Make sure you discuss any questions you have with your health care provider. Document Revised: 04/11/2020 Document Reviewed: 04/11/2020 Elsevier Patient Education  2021 [employer].

## 2021-02-14 DIAGNOSIS — M545 Low back pain, unspecified: Secondary | ICD-10-CM | POA: Diagnosis not present

## 2021-02-23 ENCOUNTER — Other Ambulatory Visit: Payer: Self-pay | Admitting: Internal Medicine

## 2021-02-25 DIAGNOSIS — N951 Menopausal and female climacteric states: Secondary | ICD-10-CM | POA: Diagnosis not present

## 2021-02-25 DIAGNOSIS — R351 Nocturia: Secondary | ICD-10-CM | POA: Diagnosis not present

## 2021-02-25 DIAGNOSIS — N952 Postmenopausal atrophic vaginitis: Secondary | ICD-10-CM | POA: Diagnosis not present

## 2021-02-25 DIAGNOSIS — N3941 Urge incontinence: Secondary | ICD-10-CM | POA: Diagnosis not present

## 2021-02-26 ENCOUNTER — Other Ambulatory Visit: Payer: Self-pay | Admitting: Internal Medicine

## 2021-03-08 ENCOUNTER — Telehealth: Payer: Self-pay | Admitting: Internal Medicine

## 2021-03-08 NOTE — Telephone Encounter (Addendum)
Patient called in and wanted to see if  provider could put an order in to get a urine specimen because she thinks she has a UTI. Informed patient that provider was out of the office and she could be seen by other provider but patient declined and stated that she will call back Monday.

## 2021-04-04 ENCOUNTER — Telehealth: Payer: Self-pay | Admitting: Pharmacist

## 2021-04-04 NOTE — Chronic Care Management (AMB) (Signed)
    Chronic Care Management Pharmacy Assistant   Name: Christina Myers  MRN: [id number] DOB: 06/23/1941  Reason for Encounter: General Adherence Call       Recent office visits:  01.19.2022 Burnis Medin, MD Internal Medicine 12.13.2021 Billie Ruddy, MD Family Medicine  Recent consult visits:  None  Hospital visits:  None in previous 6 months  Medications: Outpatient Encounter Medications as of 04/04/2021  Medication Sig Note  . aspirin 81 MG tablet Take 81 mg by mouth daily.   . Cholecalciferol (VITAMIN D3) 2000 units TABS Take 2,000 Units by mouth daily.   . Eluxadoline 75 MG TABS Take by mouth as needed.   [name] estradiol (ESTRACE) 0.1 MG/GM vaginal cream 1 -2 gram intravaginally 2  x per week   . simvastatin (ZOCOR) 20 MG tablet TAKE 1 TABLET(20 MG) BY MOUTH AT BEDTIME   . SYNTHROID 100 MCG tablet TAKE 1 TABLET(100 MCG) BY MOUTH DAILY   . tobramycin (TOBREX) 0.3 % ophthalmic solution  10/17/2020: As needed   No facility-administered encounter medications on file as of 04/04/2021.   I spoke with the patient and review medication adherence. She states that she has been doing well. She does not have any issues with her current medications. She continues to maintain her diet. She has had no recent changes to her medications. She denies any side effects from her medications. there have not been any Ed or urgent care visions since her last CPP or PCP visit. She is not having any complications with her current pharmacy.   Star Rating Drugs:  Dispensed Quantity Pharmacy  Simvastatin 03.05.2022 [address] [place], [place] [phone]

## 2021-04-10 DIAGNOSIS — H353222 Exudative age-related macular degeneration, left eye, with inactive choroidal neovascularization: Secondary | ICD-10-CM | POA: Diagnosis not present

## 2021-04-25 ENCOUNTER — Telehealth: Payer: Self-pay | Admitting: Pharmacist

## 2021-04-25 NOTE — Chronic Care Management (AMB) (Signed)
04/25/5021- Called patient to reschedule CPP visit for 05/02/2021 with Jeni Salles, spouse answered, patient not home at the time but he will have her return my call when she gets in.   04/29/2021- Called patient again to reschedule CPP visit, she can reschedule her appointment to 05/03/2021 @ 915 am. Jeni Salles aware and will have Calton Golds, scheduler move appointment.  Christina Myers, [place]

## 2021-05-02 ENCOUNTER — Telehealth: Payer: Medicare Other

## 2021-05-03 ENCOUNTER — Telehealth: Payer: Medicare Other

## 2021-05-03 ENCOUNTER — Ambulatory Visit (INDEPENDENT_AMBULATORY_CARE_PROVIDER_SITE_OTHER): Payer: Medicare Other | Admitting: Pharmacist

## 2021-05-03 DIAGNOSIS — E039 Hypothyroidism, unspecified: Secondary | ICD-10-CM

## 2021-05-03 DIAGNOSIS — E782 Mixed hyperlipidemia: Secondary | ICD-10-CM | POA: Diagnosis not present

## 2021-05-03 NOTE — Progress Notes (Signed)
Chronic Care Management Pharmacy Note  05/20/2021 Name:  Christina Myers MRN:  [id number] DOB:  [birth year]  Subjective: Christina Myers is an 80 y.o. year old female who is a primary patient of Panosh, Standley Brooking, MD.  The CCM team was consulted for assistance with disease management and care coordination needs.    Engaged with patient by telephone for follow up visit in response to provider referral for pharmacy case management and/or care coordination services.   Consent to Services:  The patient was given information about Chronic Care Management services, agreed to services, and gave verbal consent prior to initiation of services.  Please see initial visit note for detailed documentation.   Patient Care Team: Panosh, Standley Brooking, MD as PCP - Sandford Craze, MD (Dermatology) Richmond Campbell, MD (Gastroenterology) Elsie Saas, MD (Orthopedic Surgery) Sherlynn Stalls, MD as Consulting Physician (Ophthalmology) [place], [place], [place] (Optometry) [name], [facility] as Pharmacist (Pharmacist)  Recent office visits: 01/09/21 Shanon Ace, MD: Patient presented for annual exam. LDL improved.  12/03/20 Grier Mitts, MD: Patient presented for abdominal pain.   Recent consult visits: 02/25/21 Jody Bovard (OBGYN): Unable to access notes.  12/28/20 Jody Bovard (OBGYN): Unable to access notes.  Hospital visits: None in previous 6 months  Objective:  Lab Results  Component Value Date   CREATININE 0.84 01/04/2021   BUN 22 01/04/2021   GFR 65.10 01/04/2021   NA 140 01/04/2021   K 4.1 01/04/2021   CALCIUM 8.8 01/04/2021   CO2 27 01/04/2021   GLUCOSE 93 01/04/2021    Lab Results  Component Value Date/Time   GFR 65.10 01/04/2021 10:07 AM   GFR 72.03 12/28/2019 09:53 AM    Last diabetic Eye exam: No results found for: HMDIABEYEEXA  Last diabetic Foot exam: No results found for: HMDIABFOOTEX   Lab Results  Component Value Date   CHOL 182 01/04/2021   HDL 44.20 01/04/2021    LDLCALC 83 11/19/2016   LDLDIRECT 84.0 01/04/2021   TRIG 387.0 (H) 01/04/2021   CHOLHDL 4 01/04/2021    Hepatic Function Latest Ref Rng & Units 01/04/2021 12/28/2019 12/29/2018  Total Protein 6.0 - 8.3 g/dL 6.3 6.0 6.4  Albumin 3.5 - 5.2 g/dL 4.2 4.1 4.3  AST 0 - 37 U/L '14 16 16  ' ALT 0 - 35 U/L '14 13 14  ' Alk Phosphatase 39 - 117 U/L 107 82 91  Total Bilirubin 0.2 - 1.2 mg/dL 0.4 0.4 0.4  Bilirubin, Direct 0.0 - 0.3 mg/dL 0.1 0.1 0.1    Lab Results  Component Value Date/Time   TSH 1.25 01/04/2021 10:07 AM   TSH 0.63 12/28/2019 09:53 AM   FREET4 0.88 01/04/2021 10:07 AM   FREET4 1.07 12/28/2019 09:53 AM    CBC Latest Ref Rng & Units 01/04/2021 12/28/2019 12/29/2018  WBC 4.0 - 10.5 K/uL 7.6 5.4 6.9  Hemoglobin 12.0 - 15.0 g/dL 13.3 13.7 14.1  Hematocrit 36.0 - 46.0 % 40.2 40.3 41.4  Platelets 150.0 - 400.0 K/uL 309.0 260.0 287.0    Lab Results  Component Value Date/Time   VD25OH 24 01/31/2019 12:00 AM    Clinical ASCVD: No  The ASCVD Risk score (Goodrich., et al., 2013) failed to calculate for the following reasons:   The 2013 ASCVD risk score is only valid for ages 51 to 48    Depression screen PHQ 2/9 01/09/2021 10/17/2020 01/04/2020  Decreased Interest 0 0 0  Down, Depressed, Hopeless 0 0 0  PHQ - 2 Score 0  0 0     Social History   Tobacco Use  Smoking Status Former Smoker  Smokeless Tobacco Never Used   BP Readings from Last 3 Encounters:  01/09/21 120/62  12/03/20 136/78  01/04/20 126/62   Pulse Readings from Last 3 Encounters:  01/09/21 71  12/03/20 70  01/04/20 75   Wt Readings from Last 3 Encounters:  12/03/20 129 lb (58.5 kg)  11/11/19 130 lb (59 kg)  12/29/18 130 lb (59 kg)   BMI Readings from Last 3 Encounters:  01/09/21 24.78 kg/m  12/03/20 23.59 kg/m  01/04/20 23.78 kg/m    Assessment/Interventions: Review of patient past medical history, allergies, medications, health status, including review of consultants reports, laboratory and other  test data, was performed as part of comprehensive evaluation and provision of chronic care management services.   SDOH:  (Social Determinants of Health) assessments and interventions performed: No  SDOH Screenings   Alcohol Screen: Not on file  Depression (PHQ2-9): Low Risk   . PHQ-2 Score: 0  Financial Resource Strain: Low Risk   . Difficulty of Paying Living Expenses: Not hard at all  Food Insecurity: No Food Insecurity  . Worried About Charity fundraiser in the Last Year: Never true  . Ran Out of Food in the Last Year: Never true  Housing: Low Risk   . Last Housing Risk Score: 0  Physical Activity: Inactive  . Days of Exercise per Week: 0 days  . Minutes of Exercise per Session: 0 min  Social Connections: Socially Integrated  . Frequency of Communication with Friends and Family: More than three times a week  . Frequency of Social Gatherings with Friends and Family: More than three times a week  . Attends Religious Services: More than 4 times per year  . Active Member of Clubs or Organizations: Yes  . Attends Archivist Meetings: 1 to 4 times per year  . Marital Status: Married  Stress: No Stress Concern Present  . Feeling of Stress : Not at all  Tobacco Use: Medium Risk  . Smoking Tobacco Use: Former Smoker  . Smokeless Tobacco Use: Never Used  Transportation Needs: No Transportation Needs  . Lack of Transportation (Medical): No  . Lack of Transportation (Non-Medical): No    CCM Care Plan  Allergies  Allergen Reactions  . Amoxicillin-Pot Clavulanate     REACTION: colitis  after ceclor  adn augmentin years ago  . Cefdinir     REACTION: "colitis" diarrhea    Medications Reviewed Today    Reviewed by Agnes Lawrence, CMA (Certified Medical Assistant) on 01/09/21 at 1609  Med List Status: <None>  Medication Order Taking? Sig Documenting Provider Last Dose Status Informant  aspirin 81 MG tablet [id number] Yes Take 81 mg by mouth daily. [provider]  Taking Active   Cholecalciferol (VITAMIN D3) 2000 units TABS [id number] Yes Take 2,000 Units by mouth daily. [provider] Taking Active   Eluxadoline 75 MG TABS [id number] Yes Take by mouth as needed. [provider] Taking Active   estradiol (ESTRACE) 0.1 MG/GM vaginal cream [id number] Yes 1 -2 gram intravaginally 2  x per week Panosh, Standley Brooking, MD Taking Active   simvastatin (ZOCOR) 20 MG tablet [id number] Yes TAKE 1 TABLET(20 MG) BY MOUTH AT BEDTIME Panosh, Standley Brooking, MD Taking Active   SYNTHROID 100 MCG tablet [id number] Yes TAKE 1 TABLET BY MOUTH DAILY Panosh, Standley Brooking, MD Taking Active   tobramycin (TOBREX) 0.3 % ophthalmic solution [id number] Yes  [provider] Taking Active            Med Note Willette Brace   Wed Oct 17, 2020 12:22 PM) As needed          Patient Active Problem List   Diagnosis Date Noted  . Ex-smoker 08/13/2018  . Atrophic vaginitis 03/04/2018  . Menopausal problem 03/04/2018  . Osteoarthritis 03/04/2018  . Postmenopausal bleeding 03/04/2018  . AMD (age related macular degeneration) 11/26/2016  . IBS (irritable bowel syndrome) 11/24/2015  . Nocturnal foot cramps 10/28/2013  . Postmenopausal HRT (hormone replacement therapy) 11/06/2011  . Other reasons for seeking consultation 10/01/2011  . SINUS BRADYCARDIA 04/30/2010  . OSTEOARTHRITIS, HAND 04/24/2009  . HYPERLIPIDEMIA 02/02/2008  . HAND PAIN 02/02/2008  . SKIN CANCER, HX OF 02/02/2008  . Hypothyroidism 08/30/2007  . COLONIC POLYPS, HX OF 08/30/2007    Immunization History  Administered Date(s) Administered  . Fluad Quad(high Dose 65+) 11/01/2019  . Influenza Whole 10/22/2011  . Influenza, High Dose Seasonal PF 10/28/2013, 10/24/2014, 11/21/2015, 09/30/2016, 10/22/2017, 10/25/2020  . Influenza-Unspecified 11/05/2018  . PFIZER(Purple Top)SARS-COV-2 Vaccination 01/06/2020, 01/27/2020, 09/25/2020  . Pneumococcal Conjugate-13 10/24/2014  . Pneumococcal Polysaccharide-23  12/22/2008  . Td 12/22/2008  . Zoster Recombinat (Shingrix) 07/06/2018, 10/05/2018  . Zoster, Live 06/11/2010   Patient is doing pretty good at the moment but her left hip joint is worn out and has some back pain and she fell in her garage on her back. She had xrays and an MRI and reports some days are better than others.   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 She took December off and went back in January. She still follows up with orthopedist in March.  Conditions to be addressed/monitored:  Hyperlipidemia, Hypothyroidism, Depression, Osteopenia, Osteoarthritis and IBS, UTI prevention  Care Plan : CCM Pharmacy Care Plan  Updates made by [name], [place] since 05/20/2021 12:00 AM    Problem: Problem: Hyperlipidemia, Depression, Osteopenia, Osteoarthritis and IBS, UTI prevention     Goal: Patient-Specific Goal   Start Date: 05/03/2021  Expected End Date: 05/03/2022  This Visit's Progress: On track  Priority: High  Note:   Current Barriers:  . Unable to independently monitor therapeutic efficacy  Pharmacist Clinical Goal(s):  [name] Patient will achieve adherence to monitoring guidelines and medication adherence to achieve therapeutic efficacy through collaboration with PharmD and provider.   Interventions: . 1:1 collaboration with Panosh, Standley Brooking, MD regarding development and update of comprehensive plan of care as evidenced by provider attestation and co-signature . Inter-disciplinary care team collaboration (see longitudinal plan of care) . Comprehensive medication review performed; medication list updated in electronic medical record  Hyperlipidemia: (LDL goal < 100) -Controlled -Current treatment:  Simvastatin 20 mg 1 tablet at bedtime -Medications previously tried: none  -Current dietary patterns: patient eats leaner meats; gave up cookies and white bread and bagels and has lost about 14 lbs -Current exercise habits: teaches dance  classes -Educated on Cholesterol goals;  Benefits of statin for ASCVD risk reduction; Importance of limiting foods high in cholesterol; Exercise goal of 150 minutes per week; -Counseled on diet and exercise extensively Recommended to continue current medication Collaborated with PCP to determine if she can repeat her lipid panel  *Hypothyroidism (Goal: TSH 0.35-4.5) -Controlled -Current treatment  . Synthroid 100 mcg 1 tablet daily -Medications previously tried: none  - Reached out to insurance company to apply for a tier exception for brand name Synthroid.    Osteopenia (Goal prevent  fractures) -Uncontrolled -Last DEXA Scan: 02/08/2019              T-Score femoral neck: R -1.7, L -1.2             T-Score total hip: n/a             T-Score lumbar spine: -0.8             T-Score forearm radius: n/a             10-year probability of major osteoporotic fracture: 14%             10-year probability of hip fracture: 3.7% -Patient is a candidate for pharmacologic treatment due to T-Score -1.0 to -2.5 and 10-year risk of hip fracture > 3% -Current treatment  . Vitamin D 2000 units 1 tablet daily -Medications previously tried: none  -Recommend [phone] units of vitamin D daily. Recommend 1200 mg of calcium daily from dietary and supplemental sources. Recommend weight-bearing and muscle strengthening exercises for building and maintaining bone density. -Recommended walking for weight bearing exercise  Osteoarthritis (Goal: minimize pain) -Not ideally controlled -Current treatment  . Meloxicam as needed (takes occassionally) . Tylenol as needed -Medications previously tried: none  -Counseled on using Tylenol vs ibuprofen to reduce risk of bleeding with concurrent aspirin therapy  IBS (Goal: minimize symptoms) -Controlled -Current treatment  . Viberzi 75 mg 1 tablet as needed -Medications previously tried: none  -Recommended to continue current medication  UTI prevention (Goal:  minimize symptoms) -Controlled -Current treatment  . Estradiol 0.1 mg/g vaginal cream as needed -Medications previously tried: none  -Recommended to continue current medication   Health Maintenance -Vaccine gaps: tetanus -Current therapy:  . Preservision daily . Aspirin 81 mg daily -Educated on Cost vs benefit of each product must be carefully weighed by individual consumer -Patient is satisfied with current therapy and denies issues -Collaborated with PCP about stopping aspirin therapy given primary prevention  Patient Goals/Self-Care Activities . Patient will:  - take medications as prescribed target a minimum of 150 minutes of moderate intensity exercise weekly  Follow Up Plan: Telephone follow up appointment with care management team member scheduled for: 6 months       Medication Assistance: None required.  Patient affirms current coverage meets needs.  Patient's preferred pharmacy is:  [id number] [address] [place], [address] [place] [address] Phone: [phone] Fax: [phone]  Uses pill box? Yes - 7 day pill box Pt endorses 100% compliance  We discussed: Current pharmacy is preferred with insurance plan and patient is satisfied with pharmacy services Patient decided to: Continue current medication management strategy  Care Plan and Follow Up Patient Decision:  Patient agrees to Care Plan and Follow-up.  Plan: Telephone follow up appointment with care management team member scheduled for:  6 months  Jeni Salles, PharmD [place] Pharmacist [place] [facility] [place] [phone]

## 2021-05-15 ENCOUNTER — Telehealth: Payer: Self-pay | Admitting: Pharmacist

## 2021-05-15 NOTE — Telephone Encounter (Addendum)
Patient called to see when she should get COVID tested after being exposed. She was exposed on Monday. Per CDC recommendations, 5 days after exposure is recommended to get tested.   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 Recommended testing 5 days after exposure and wearing a mask to dance class as this is still not 5 days post exposure.

## 2021-05-22 ENCOUNTER — Other Ambulatory Visit: Payer: Self-pay | Admitting: Internal Medicine

## 2021-05-22 DIAGNOSIS — Z1231 Encounter for screening mammogram for malignant neoplasm of breast: Secondary | ICD-10-CM

## 2021-05-28 ENCOUNTER — Other Ambulatory Visit (INDEPENDENT_AMBULATORY_CARE_PROVIDER_SITE_OTHER): Payer: Medicare Other

## 2021-05-28 ENCOUNTER — Other Ambulatory Visit: Payer: Self-pay

## 2021-05-28 ENCOUNTER — Other Ambulatory Visit: Payer: Self-pay | Admitting: Internal Medicine

## 2021-05-28 DIAGNOSIS — E782 Mixed hyperlipidemia: Secondary | ICD-10-CM

## 2021-05-28 LAB — LIPID PANEL
Cholesterol: 160 mg/dL (ref 0–200)
HDL: 51.8 mg/dL (ref >39.00)
LDL Cholesterol: 72 mg/dL (ref 0–99)
NonHDL: 107.95
Total CHOL/HDL Ratio: 3
Triglycerides: 179 mg/dL — ABNORMAL HIGH (ref 0.0–149.0)
VLDL: 35.8 mg/dL (ref 0.0–40.0)

## 2021-06-02 NOTE — Progress Notes (Signed)
Lipids triglycerides are much much better  barely elevated . Continue lifestyle intervention healthy eating and exercise . And the simvastatin

## 2021-06-05 ENCOUNTER — Telehealth: Payer: Self-pay | Admitting: Internal Medicine

## 2021-06-05 NOTE — Telephone Encounter (Signed)
Patient informed of the results and verbalized understanding. Per pt request results have been mailed to pt.

## 2021-06-05 NOTE — Telephone Encounter (Signed)
Patient is calling and wanted to see if her lab results were back, please advise. CB is [phone]

## 2021-06-14 DIAGNOSIS — M25532 Pain in left wrist: Secondary | ICD-10-CM | POA: Diagnosis not present

## 2021-06-14 DIAGNOSIS — M67431 Ganglion, right wrist: Secondary | ICD-10-CM | POA: Diagnosis not present

## 2021-06-25 ENCOUNTER — Telehealth: Payer: Self-pay | Admitting: Pharmacist

## 2021-06-25 NOTE — Telephone Encounter (Signed)
Called patient to let her know of recommendations from CCM visit after discussion with PCP.  Patient is aware that Dr. Regis Bill is ok with her stopping aspirin if she wants to. Patient agreed to finish up her supply at home and then stop taking the aspirin. Removed it from her medication list. Patient is also aware tier exception for levothyroxine was denied.  Patient also wanted to make the office aware that she has a UTI and got an antibiotic prescribed by Dr. Melba Coon (her OBGYN) to start today.  Congratulated patient on her lipid panel results with lowering of her triglycerides. Patient has made an active effort to make dietary changes and has lost 17 lbs since January.

## 2021-07-01 DIAGNOSIS — L821 Other seborrheic keratosis: Secondary | ICD-10-CM | POA: Diagnosis not present

## 2021-07-01 DIAGNOSIS — L820 Inflamed seborrheic keratosis: Secondary | ICD-10-CM | POA: Diagnosis not present

## 2021-07-01 DIAGNOSIS — Z85828 Personal history of other malignant neoplasm of skin: Secondary | ICD-10-CM | POA: Diagnosis not present

## 2021-07-01 DIAGNOSIS — L298 Other pruritus: Secondary | ICD-10-CM | POA: Diagnosis not present

## 2021-07-03 DIAGNOSIS — H353222 Exudative age-related macular degeneration, left eye, with inactive choroidal neovascularization: Secondary | ICD-10-CM | POA: Diagnosis not present

## 2021-07-03 DIAGNOSIS — H35423 Microcystoid degeneration of retina, bilateral: Secondary | ICD-10-CM | POA: Diagnosis not present

## 2021-07-03 DIAGNOSIS — H5203 Hypermetropia, bilateral: Secondary | ICD-10-CM | POA: Diagnosis not present

## 2021-07-03 DIAGNOSIS — H43813 Vitreous degeneration, bilateral: Secondary | ICD-10-CM | POA: Diagnosis not present

## 2021-07-03 DIAGNOSIS — H353112 Nonexudative age-related macular degeneration, right eye, intermediate dry stage: Secondary | ICD-10-CM | POA: Diagnosis not present

## 2021-07-09 DIAGNOSIS — N952 Postmenopausal atrophic vaginitis: Secondary | ICD-10-CM | POA: Diagnosis not present

## 2021-07-09 DIAGNOSIS — R309 Painful micturition, unspecified: Secondary | ICD-10-CM | POA: Diagnosis not present

## 2021-07-09 DIAGNOSIS — N898 Other specified noninflammatory disorders of vagina: Secondary | ICD-10-CM | POA: Diagnosis not present

## 2021-07-23 ENCOUNTER — Other Ambulatory Visit: Payer: Self-pay

## 2021-07-23 ENCOUNTER — Ambulatory Visit
Admission: RE | Admit: 2021-07-23 | Discharge: 2021-07-23 | Disposition: A | Discharge status: Home / Self Care | Payer: Medicare Other | Source: Ambulatory Visit | Attending: Internal Medicine | Admitting: Internal Medicine

## 2021-07-23 DIAGNOSIS — Z1231 Encounter for screening mammogram for malignant neoplasm of breast: Secondary | ICD-10-CM | POA: Diagnosis not present

## 2021-08-12 ENCOUNTER — Telehealth: Payer: Self-pay | Admitting: Pharmacist

## 2021-08-12 NOTE — Chronic Care Management (AMB) (Signed)
    Chronic Care Management Pharmacy Assistant   Name: Christina Myers  MRN: [id number] DOB: 06-11-1941  Reason for Encounter: General Assessment Call   Recent office visits:  None  Recent consult visits:  07-23-2021 [facility] - Patient presented for a Mammogram  07-09-2021 [place], Andree Moro NP - Patient presented for Dysuria and other concerns. No medication changes available.   Hospital visits:  None in previous 6 months  Medications: Outpatient Encounter Medications as of 08/12/2021  Medication Sig Note   Cholecalciferol (VITAMIN D3) 2000 units TABS Take 2,000 Units by mouth daily.    Eluxadoline 75 MG TABS Take by mouth as needed.    estradiol (ESTRACE) 0.1 MG/GM vaginal cream 1 -2 gram intravaginally 2  x per week    simvastatin (ZOCOR) 20 MG tablet TAKE 1 TABLET(20 MG) BY MOUTH AT BEDTIME    SYNTHROID 100 MCG tablet TAKE 1 TABLET(100 MCG) BY MOUTH DAILY    tobramycin (TOBREX) 0.3 % ophthalmic solution  10/17/2020: As needed   No facility-administered encounter medications on file as of 08/12/2021.  Notes: Call to patient she advised she is taking all of the above medications as prescribed. She reports no recent hospital or urgent care visits. She reports no side effects from any medications. She asked for her recent lipid panel results, discussed them and the notes from Dr Regis Bill with her, she reports there was a date showing to return in Aug for repeat labs in Radiology but she had not gone. Advised her there were no lab appointments in her chart from Dr Regis Bill upcoming. She reports no other concerns or issues at this time.  Care Gaps: TDAP - Overdue COVID Booster #4 Therapist, music) - Overdue Flu Vaccine - Overdue AWV - Scheduled for 10-23-21 CCM F/ U Call - Scheduled for 12-25-21 at 4   Star Rating Drugs: Simvastatin (Zocor) 20 mg - Last filled 05-24-2021 [facility] at [place] Pharmacist Assistant [phone]

## 2021-09-06 DIAGNOSIS — M1612 Unilateral primary osteoarthritis, left hip: Secondary | ICD-10-CM | POA: Diagnosis not present

## 2021-09-08 ENCOUNTER — Other Ambulatory Visit: Payer: Self-pay | Admitting: Internal Medicine

## 2021-10-23 ENCOUNTER — Ambulatory Visit (INDEPENDENT_AMBULATORY_CARE_PROVIDER_SITE_OTHER): Payer: Medicare Other

## 2021-10-23 ENCOUNTER — Other Ambulatory Visit: Payer: Self-pay

## 2021-10-23 DIAGNOSIS — Z0000 Encounter for general adult medical examination without abnormal findings: Secondary | ICD-10-CM | POA: Diagnosis not present

## 2021-10-23 NOTE — Patient Instructions (Signed)
[name] , Thank you for taking time to come for your Medicare Wellness Visit. I appreciate your ongoing commitment to your health goals. Please review the following plan we discussed and let me know if I can assist you in the future.   Screening recommendations/referrals: Colonoscopy: Done 08/23/20 repeat as directed by PCP Mammogram: Done 07/23/21 repeat every year Bone Density: Done 02/08/19 repeat every 2 years  Recommended yearly ophthalmology/optometry visit for glaucoma screening and checkup Recommended yearly dental visit for hygiene and checkup  Vaccinations: Influenza vaccine: Done 10/21/21 Pneumococcal vaccine: Up to date Tdap vaccine: Due Shingles vaccine: Completed 7/16, 10/05/18   Covid-19:Completed 1/15, 2/5, & 09/25/20  Advanced directives: Please bring a copy of your health care power of attorney and living will to the office at your convenience.  Conditions/risks identified: Maintain weight at this state 107 lbs and lower cholesterol           Next appointment: Follow up in one year for your annual wellness visit    Preventive Care 65 Years and Older, Female Preventive care refers to lifestyle choices and visits with your health care provider that can promote health and wellness. What does preventive care include? A yearly physical exam. This is also called an annual well check. Dental exams once or twice a year. Routine eye exams. Ask your health care provider how often you should have your eyes checked. Personal lifestyle choices, including: Daily care of your teeth and gums. Regular physical activity. Eating a healthy diet. Avoiding tobacco and drug use. Limiting alcohol use. Practicing safe sex. Taking low-dose aspirin every day. Taking vitamin and mineral supplements as recommended by your health care provider. What happens during an annual well check? The services and screenings done by your health care provider during your annual well check will depend on  your age, overall health, lifestyle risk factors, and family history of disease. Counseling  Your health care provider may ask you questions about your: Alcohol use. Tobacco use. Drug use. Emotional well-being. Home and relationship well-being. Sexual activity. Eating habits. History of falls. Memory and ability to understand (cognition). Work and work Statistician. Reproductive health. Screening  You may have the following tests or measurements: Height, weight, and BMI. Blood pressure. Lipid and cholesterol levels. These may be checked every 5 years, or more frequently if you are over 73 years old. Skin check. Lung cancer screening. You may have this screening every year starting at age 34 if you have a 30-pack-year history of smoking and currently smoke or have quit within the past 15 years. Fecal occult blood test (FOBT) of the stool. You may have this test every year starting at age 44. Flexible sigmoidoscopy or colonoscopy. You may have a sigmoidoscopy every 5 years or a colonoscopy every 10 years starting at age 24. Hepatitis C blood test. Hepatitis B blood test. Sexually transmitted disease (STD) testing. Diabetes screening. This is done by checking your blood sugar (glucose) after you have not eaten for a while (fasting). You may have this done every 1-3 years. Bone density scan. This is done to screen for osteoporosis. You may have this done starting at age 16. Mammogram. This may be done every 1-2 years. Talk to your health care provider about how often you should have regular mammograms. Talk with your health care provider about your test results, treatment options, and if necessary, the need for more tests. Vaccines  Your health care provider may recommend certain vaccines, such as: Influenza vaccine. This is recommended every  year. Tetanus, diphtheria, and acellular pertussis (Tdap, Td) vaccine. You may need a Td booster every 10 years. Zoster vaccine. You may need this  after age 96. Pneumococcal 13-valent conjugate (PCV13) vaccine. One dose is recommended after age 35. Pneumococcal polysaccharide (PPSV23) vaccine. One dose is recommended after age 83. Talk to your health care provider about which screenings and vaccines you need and how often you need them. This information is not intended to replace advice given to you by your health care provider. Make sure you discuss any questions you have with your health care provider. Document Released: 01/04/2016 Document Revised: 08/27/2016 Document Reviewed: 10/09/2015 Elsevier Interactive Patient Education  2017 [employer] Prevention in the Home Falls can cause injuries. They can happen to people of all ages. There are many things you can do to make your home safe and to help prevent falls. What can I do on the outside of my home? Regularly fix the edges of walkways and driveways and fix any cracks. Remove anything that might make you trip as you walk through a door, such as a raised step or threshold. Trim any bushes or trees on the path to your home. Use bright outdoor lighting. Clear any walking paths of anything that might make someone trip, such as rocks or tools. Regularly check to see if handrails are loose or broken. Make sure that both sides of any steps have handrails. Any raised decks and porches should have guardrails on the edges. Have any leaves, snow, or ice cleared regularly. Use sand or salt on walking paths during winter. Clean up any spills in your garage right away. This includes oil or grease spills. What can I do in the bathroom? Use night lights. Install grab bars by the toilet and in the tub and shower. Do not use towel bars as grab bars. Use non-skid mats or decals in the tub or shower. If you need to sit down in the shower, use a plastic, non-slip stool. Keep the floor dry. Clean up any water that spills on the floor as soon as it happens. Remove soap buildup in the tub or  shower regularly. Attach bath mats securely with double-sided non-slip rug tape. Do not have throw rugs and other things on the floor that can make you trip. What can I do in the bedroom? Use night lights. Make sure that you have a light by your bed that is easy to reach. Do not use any sheets or blankets that are too big for your bed. They should not hang down onto the floor. Have a firm chair that has side arms. You can use this for support while you get dressed. Do not have throw rugs and other things on the floor that can make you trip. What can I do in the kitchen? Clean up any spills right away. Avoid walking on wet floors. Keep items that you use a lot in easy-to-reach places. If you need to reach something above you, use a strong step stool that has a grab bar. Keep electrical cords out of the way. Do not use floor polish or wax that makes floors slippery. If you must use wax, use non-skid floor wax. Do not have throw rugs and other things on the floor that can make you trip. What can I do with my stairs? Do not leave any items on the stairs. Make sure that there are handrails on both sides of the stairs and use them. Fix handrails that are broken or  loose. Make sure that handrails are as long as the stairways. Check any carpeting to make sure that it is firmly attached to the stairs. Fix any carpet that is loose or worn. Avoid having throw rugs at the top or bottom of the stairs. If you do have throw rugs, attach them to the floor with carpet tape. Make sure that you have a light switch at the top of the stairs and the bottom of the stairs. If you do not have them, ask someone to add them for you. What else can I do to help prevent falls? Wear shoes that: Do not have high heels. Have rubber bottoms. Are comfortable and fit you well. Are closed at the toe. Do not wear sandals. If you use a stepladder: Make sure that it is fully opened. Do not climb a closed stepladder. Make  sure that both sides of the stepladder are locked into place. Ask someone to hold it for you, if possible. Clearly mark and make sure that you can see: Any grab bars or handrails. First and last steps. Where the edge of each step is. Use tools that help you move around (mobility aids) if they are needed. These include: Canes. Walkers. Scooters. Crutches. Turn on the lights when you go into a dark area. Replace any light bulbs as soon as they burn out. Set up your furniture so you have a clear path. Avoid moving your furniture around. If any of your floors are uneven, fix them. If there are any pets around you, be aware of where they are. Review your medicines with your doctor. Some medicines can make you feel dizzy. This can increase your chance of falling. Ask your doctor what other things that you can do to help prevent falls. This information is not intended to replace advice given to you by your health care provider. Make sure you discuss any questions you have with your health care provider. Document Released: 10/04/2009 Document Revised: 05/15/2016 Document Reviewed: 01/12/2015 Elsevier Interactive Patient Education  2017 [employer].

## 2021-10-23 NOTE — Progress Notes (Signed)
Virtual Visit via Telephone Note  I connected with  Christina Myers on 10/23/21 at  1:00 PM EDT by telephone and verified that I am speaking with the correct person using two identifiers.  Medicare Annual Wellness visit completed telephonically due to Covid-19 pandemic.   Persons participating in this call: This Health Coach and this patient.   Location: Patient: Home Provider: Office   I discussed the limitations, risks, security and privacy concerns of performing an evaluation and management service by telephone and the availability of in person appointments. The patient expressed understanding and agreed to proceed.  Unable to perform video visit due to video visit attempted and failed and/or patient does not have video capability.   Some vital signs may be absent or patient reported.   Willette Brace, LPN   Subjective:   Christina Myers is a 80 y.o. female who presents for Medicare Annual (Subsequent) preventive examination.  Review of Systems     Cardiac Risk Factors include: advanced age (>34men, >82 women);hypertension     Objective:    There were no vitals filed for this visit. There is no height or weight on file to calculate BMI.  Advanced Directives 10/23/2021 10/17/2020  Does Patient Have a Medical Advance Directive? Yes Yes  Type of Advance Directive Healthcare Power of [place] will  [place] in Chart? No - copy requested -    Current Medications (verified) Outpatient Encounter Medications as of 10/23/2021  Medication Sig   Cholecalciferol (VITAMIN D3) 2000 units TABS Take 2,000 Units by mouth daily.   Eluxadoline 75 MG TABS Take by mouth as needed.   estradiol (ESTRACE) 0.1 MG/GM vaginal cream 1 -2 gram intravaginally 2  x per week   SYNTHROID 100 MCG tablet TAKE 1 TABLET(100 MCG) BY MOUTH DAILY   simvastatin (ZOCOR) 20 MG tablet TAKE 1 TABLET(20 MG) BY MOUTH AT BEDTIME   tobramycin (TOBREX) 0.3 % ophthalmic  solution  (Patient not taking: Reported on 10/23/2021)   No facility-administered encounter medications on file as of 10/23/2021.    Allergies (verified) Amoxicillin-pot clavulanate and Cefdinir   History: Past Medical History:  Diagnosis Date   COLONIC POLYPS, HX OF 08/30/2007   Qualifier: Diagnosis of  By: Rogue Bussing CMA, Jacqualynn     Hx of colonic polyps    Hypertension    Hypothyroidism    OSTEOARTHRITIS, HAND 04/24/2009   Qualifier: Diagnosis of  By: Regis Bill MD, Standley Brooking    PVC (premature ventricular contraction)    [facility] evaluation 7/03   Seasonal allergies    Skin cancer    hx of bcca of face   SKIN CANCER, HX OF 02/02/2008   Qualifier: Diagnosis of  By: Regis Bill MD, Standley Brooking    Past Surgical History:  Procedure Laterality Date   FACIAL COSMETIC SURGERY     TONSILLECTOMY     tubal lig     Family History  Problem Relation Age of Onset   Cancer Other        colon, prostate    Diabetes Other    Stroke Other    Heart disease Other    Colon cancer Mother    Social History   Socioeconomic History   Marital status: Married    Spouse name: Not on file   Number of children: Not on file   Years of education: Not on file   Highest education level: Not on file  Occupational History   Not on file  Tobacco  Use   Smoking status: Former   Smokeless tobacco: Never  Substance and Sexual Activity   Alcohol use: Yes    Comment: rarely   Drug use: No   Sexual activity: Not on file  Other Topics Concern   Not on file  Social History Narrative   Married    Former smoker   Set designer and teaches tap dancing.    No etoh   HH of 2    Social Determinants of Radio broadcast assistant Strain: Low Risk    Difficulty of Paying Living Expenses: Not hard at all  Food Insecurity: No Food Insecurity   Worried About Charity fundraiser in the Last Year: Never true   Arboriculturist in the Last Year: Never true  Transportation Needs: No Transportation Needs   Lack of  Transportation (Medical): No   Lack of Transportation (Non-Medical): No  Physical Activity: Insufficiently Active   Days of Exercise per Week: 1 day   Minutes of Exercise per Session: 50 min  Stress: No Stress Concern Present   Feeling of Stress : Only a little  Social Connections: Moderately Integrated   Frequency of Communication with Friends and Family: More than three times a week   Frequency of Social Gatherings with Friends and Family: More than three times a week   Attends Religious Services: More than 4 times per year   Active Member of [employer] or Organizations: No   Attends Music therapist: Never   Marital Status: Married    Tobacco Counseling Counseling given: Not Answered   Clinical Intake:  Pre-visit preparation completed: Yes  Pain : No/denies pain     BMI - recorded: 24.78 Nutritional Status: BMI 25 -29 Overweight Nutritional Risks: None Diabetes: No  How often do you need to have someone help you when you read instructions, pamphlets, or other written materials from your doctor or pharmacy?: 1 - Never  Diabetic?no  Interpreter Needed?: No  Information entered by :: Charlott Rakes, LPN   Activities of Daily Living In your present state of health, do you have any difficulty performing the following activities: 10/23/2021  Hearing? N  Vision? N  Difficulty concentrating or making decisions? N  Walking or climbing stairs? N  Dressing or bathing? N  Doing errands, shopping? N  Preparing Food and eating ? N  Using the Toilet? N  In the past six months, have you accidently leaked urine? N  Do you have problems with loss of bowel control? N  Managing your Medications? N  Managing your Finances? N  Some recent data might be hidden    Patient Care Team: Panosh, Standley Brooking, MD as PCP - General Danella Sensing, MD (Dermatology) Richmond Campbell, MD (Gastroenterology) Elsie Saas, MD (Orthopedic Surgery) Sherlynn Stalls, MD as Consulting  Physician (Ophthalmology) [place], Heather, [place] (Optometry) [name], [facility] as Pharmacist (Pharmacist)  Indicate any recent Medical Services you may have received from other than [facility] providers in the past year (date may be approximate).     Assessment:   This is a routine wellness examination for [facility].  Hearing/Vision screen Hearing Screening - Comments:: Pt denies any hearing  Vision Screening - Comments:: Pt follows up with Dr Heather [place] for annual eye exams   Dietary issues and exercise activities discussed: Current Exercise Habits: Home exercise routine, Type of exercise: Other - see comments, Time (Minutes): 50, Frequency (Times/Week): 1, Weekly Exercise (Minutes/Week): 50   Goals Addressed  This Visit's Progress    Patient Stated       Maintain weight at this state 107 lbs and lower cholesterol        Depression Screen PHQ 2/9 Scores 10/23/2021 01/09/2021 10/17/2020 01/04/2020 12/29/2018 12/23/2017 11/26/2016  PHQ - 2 Score 0 0 0 0 0 0 0    Fall Risk Fall Risk  10/23/2021 01/09/2021 10/17/2020 01/04/2020 12/29/2018  Falls in the past year? 1 1 0 0 0  Number falls in past yr: 1 0 0 0 0  Injury with Fall? 1 0 0 0 0  Comment back - - - -  Follow up Falls prevention discussed - Falls prevention discussed Falls evaluation completed -    FALL RISK PREVENTION PERTAINING TO THE HOME:  Any stairs in or around the home? Yes  If so, are there any without handrails? No  Home free of loose throw rugs in walkways, pet beds, electrical cords, etc? No  Adequate lighting in your home to reduce risk of falls? No   ASSISTIVE DEVICES UTILIZED TO PREVENT FALLS:  Life alert? No  Use of a cane, walker or w/c? No  Grab bars in the bathroom? Yes  Shower chair or bench in shower? No  Elevated toilet seat or a handicapped toilet? No   TIMED UP AND GO:  Was the test performed? No .  Cognitive Function:     6CIT Screen 10/23/2021 10/17/2020  What Year? 0 points 0 points   What month? 0 points 0 points  What time? 0 points -  Count back from 20 0 points 0 points  Months in reverse 0 points 0 points  Repeat phrase 0 points 0 points  Total Score 0 -    Immunizations Immunization History  Administered Date(s) Administered   Fluad Quad(high Dose 65+) 11/01/2019   Influenza Whole 10/22/2011   Influenza, High Dose Seasonal PF 10/28/2013, 10/24/2014, 11/21/2015, 09/30/2016, 10/22/2017, 10/25/2020   Influenza-Unspecified 11/05/2018, 10/21/2021   PFIZER(Purple Top)SARS-COV-2 Vaccination 01/06/2020, 01/27/2020, 09/25/2020   Pneumococcal Conjugate-13 10/24/2014   Pneumococcal Polysaccharide-23 12/22/2008   Td 12/22/2008   Zoster Recombinat (Shingrix) 07/06/2018, 10/05/2018   Zoster, Live 06/11/2010    TDAP status: Due, Education has been provided regarding the importance of this vaccine. Advised may receive this vaccine at local pharmacy or Health Dept. Aware to provide a copy of the vaccination record if obtained from local pharmacy or Health Dept. Verbalized acceptance and understanding.  Flu Vaccine status: Up to date  Pneumococcal vaccine status: Up to date  Covid-19 vaccine status: Completed vaccines  Qualifies for Shingles Vaccine? Yes   Zostavax completed Yes   Shingrix Completed?: Yes  Screening Tests Health Maintenance  Topic Date Due   TETANUS/TDAP  12/22/2018   COVID-19 Vaccine (4 - Booster for Pfizer series) 11/20/2020   Pneumonia Vaccine 43+ Years old  Completed   INFLUENZA VACCINE  Completed   DEXA SCAN  Completed   Zoster Vaccines- Shingrix  Completed   HPV VACCINES  Aged Out    Health Maintenance  Health Maintenance Due  Topic Date Due   TETANUS/TDAP  12/22/2018   COVID-19 Vaccine (4 - Booster for Pfizer series) 11/20/2020    Colorectal cancer screening: Type of screening: Colonoscopy. Completed 08/23/20. Repeat every 5 years  Mammogram status: Completed 07/23/21. Repeat every year  Bone Density status: Completed 02/08/19.  Results reflect: Bone density results: OSTEOPENIA. Repeat every 2 years.    Additional Screening:   Vision Screening: Recommended annual ophthalmology exams for early detection of glaucoma  and other disorders of the eye. Is the patient up to date with their annual eye exam?  Yes  Who is the provider or what is the name of the office in which the patient attends annual eye exams? Dr Heather [place]  If pt is not established with a provider, would they like to be referred to a provider to establish care? No .   Dental Screening: Recommended annual dental exams for proper oral hygiene  Community Resource Referral / Chronic Care Management: CRR required this visit?  No   CCM required this visit?  No      Plan:     I have personally reviewed and noted the following in the patient's chart:   Medical and social history Use of alcohol, tobacco or illicit drugs  Current medications and supplements including opioid prescriptions.  Functional ability and status Nutritional status Physical activity Advanced directives List of other physicians Hospitalizations, surgeries, and ER visits in previous 12 months Vitals Screenings to include cognitive, depression, and falls Referrals and appointments  In addition, I have reviewed and discussed with patient certain preventive protocols, quality metrics, and best practice recommendations. A written personalized care plan for preventive services as well as general preventive health recommendations were provided to patient.     Willette Brace, LPN   09/26/2693   Nurse Notes: Pt stated she is having leg cramps nightly and has concerns it may be the zocor. Please advise

## 2021-11-04 DIAGNOSIS — H35423 Microcystoid degeneration of retina, bilateral: Secondary | ICD-10-CM | POA: Diagnosis not present

## 2021-11-04 DIAGNOSIS — H353112 Nonexudative age-related macular degeneration, right eye, intermediate dry stage: Secondary | ICD-10-CM | POA: Diagnosis not present

## 2021-11-04 DIAGNOSIS — H43813 Vitreous degeneration, bilateral: Secondary | ICD-10-CM | POA: Diagnosis not present

## 2021-11-04 DIAGNOSIS — H353221 Exudative age-related macular degeneration, left eye, with active choroidal neovascularization: Secondary | ICD-10-CM | POA: Diagnosis not present

## 2021-11-22 ENCOUNTER — Telehealth: Payer: Self-pay | Admitting: Internal Medicine

## 2021-11-22 DIAGNOSIS — E782 Mixed hyperlipidemia: Secondary | ICD-10-CM

## 2021-11-22 DIAGNOSIS — Z79899 Other long term (current) drug therapy: Secondary | ICD-10-CM

## 2021-11-22 DIAGNOSIS — E039 Hypothyroidism, unspecified: Secondary | ICD-10-CM

## 2021-11-22 DIAGNOSIS — M1990 Unspecified osteoarthritis, unspecified site: Secondary | ICD-10-CM

## 2021-11-22 DIAGNOSIS — K589 Irritable bowel syndrome without diarrhea: Secondary | ICD-10-CM

## 2021-11-22 NOTE — Telephone Encounter (Addendum)
Patient called in to schedule Annual Physical Patient also requested to have her labs done 12 wk prior to physical due to wanting Provider to have results when she comes in.Christina Myers Kitchenibuprofen also informed patient that she maybe charged for the labs due to insurance may not cover it. Patient stated that that was fine

## 2021-11-27 ENCOUNTER — Other Ambulatory Visit: Payer: Self-pay | Admitting: Internal Medicine

## 2021-12-08 NOTE — Telephone Encounter (Signed)
Lab orders have been placed .  Make lab appt before yearly visit so results available for visit

## 2021-12-10 DIAGNOSIS — H35371 Puckering of macula, right eye: Secondary | ICD-10-CM | POA: Diagnosis not present

## 2021-12-10 DIAGNOSIS — H353112 Nonexudative age-related macular degeneration, right eye, intermediate dry stage: Secondary | ICD-10-CM | POA: Diagnosis not present

## 2021-12-10 DIAGNOSIS — H43813 Vitreous degeneration, bilateral: Secondary | ICD-10-CM | POA: Diagnosis not present

## 2021-12-10 DIAGNOSIS — H353221 Exudative age-related macular degeneration, left eye, with active choroidal neovascularization: Secondary | ICD-10-CM | POA: Diagnosis not present

## 2021-12-11 DIAGNOSIS — Z85828 Personal history of other malignant neoplasm of skin: Secondary | ICD-10-CM | POA: Diagnosis not present

## 2021-12-11 DIAGNOSIS — L570 Actinic keratosis: Secondary | ICD-10-CM | POA: Diagnosis not present

## 2021-12-24 ENCOUNTER — Telehealth: Payer: Self-pay | Admitting: Pharmacist

## 2021-12-24 NOTE — Chronic Care Management (AMB) (Signed)
° ° °  Chronic Care Management Pharmacy Assistant   Name: Venda Dice  MRN: [id number] DOB: [birth year]  12/25/2021 APPOINTMENT REMINDER   Called Vonda Antigua Adduci, No answer, left message of appointment on 12/25/2021 at 4:00 via telephone visit with Jeni Salles, Pharm D. Notified to have all medications, supplements, blood pressure and/or blood sugar logs available during appointment and to return call if need to reschedule.  OR: Tikesha Mort Aliano's husband was reminded to have all medications, supplements and any blood glucose and blood pressure readings available for review with Jeni Salles Pharm. D, at her telephone visit on 12/25/2021 at 4:00. Patient was not at home to review additional questions.    Care Gaps: AWV - Scheduled for 11/04/2022 TDAP - Overdue COVID Booster - Overdue Flu Vaccine - Overdue  Star Rating Drug: Simvastatin (Zocor) 20 mg - Last filled 08/25/2021 90 DS at [facility]  Any gaps in medications fill history? No  Gennie Alma [facility]  Catering manager [phone]

## 2021-12-25 ENCOUNTER — Ambulatory Visit (INDEPENDENT_AMBULATORY_CARE_PROVIDER_SITE_OTHER): Payer: Medicare Other | Admitting: Pharmacist

## 2021-12-25 DIAGNOSIS — E782 Mixed hyperlipidemia: Secondary | ICD-10-CM

## 2021-12-25 DIAGNOSIS — E039 Hypothyroidism, unspecified: Secondary | ICD-10-CM

## 2021-12-25 NOTE — Progress Notes (Signed)
Chronic Care Management Pharmacy Note  12/30/2021 Name:  Dekisha Mesmer MRN:  [id number] DOB:  Jan 01, 1941  Summary: Pt is having cramping at night TSH is not at ideal target of 2.5-4.5 with concurrent osteopenia   Recommendations/Changes made from today's visit: -Recommended drinking more water and consider supplementing with 400-500 mg of magnesium daily to see if this helps -Recommended repeat vitamin D level given cramping prior to change in statin therapy -Recommend adjusting levothyroxine depending on TSH if within 2.5-4.5 target level   Plan: Follow up 1-2 months on cramping  Subjective: Paityn Balsam is an 81 y.o. year old female who is a primary patient of Panosh, Standley Brooking, MD.  The CCM team was consulted for assistance with disease management and care coordination needs.    Engaged with patient by telephone for follow up visit in response to provider referral for pharmacy case management and/or care coordination services.   Consent to Services:  The patient was given information about Chronic Care Management services, agreed to services, and gave verbal consent prior to initiation of services.  Please see initial visit note for detailed documentation.   Patient Care Team: Burnis Medin, MD as PCP - Sandford Craze, MD (Dermatology) Richmond Campbell, MD (Gastroenterology) Elsie Saas, MD (Orthopedic Surgery) Sherlynn Stalls, MD as Consulting Physician (Ophthalmology) [place], Heather, [place] (Optometry) [name], [facility] as Pharmacist (Pharmacist)  Recent office visits: 10/23/21 Charlott Rakes, LPN: Patient presented for AWV.  01/09/21 Shanon Ace, MD: Patient presented for annual exam. LDL improved.  Recent consult visits: 07/23/21 [facility] - Patient presented for a Mammogram   07/09/21 Key, Andree Moro NP - Patient presented for Dysuria and other concerns. No medication changes available.  02/25/21 Jody Bovard (OBGYN): Unable to access  notes.  12/28/20 Jody Bovard (OBGYN): Unable to access notes.  Hospital visits: None in previous 6 months  Objective:  Lab Results  Component Value Date   CREATININE 0.84 01/04/2021   BUN 22 01/04/2021   GFR 65.10 01/04/2021   NA 140 01/04/2021   K 4.1 01/04/2021   CALCIUM 8.8 01/04/2021   CO2 27 01/04/2021   GLUCOSE 93 01/04/2021    Lab Results  Component Value Date/Time   GFR 65.10 01/04/2021 10:07 AM   GFR 72.03 12/28/2019 09:53 AM    Last diabetic Eye exam: No results found for: HMDIABEYEEXA  Last diabetic Foot exam: No results found for: HMDIABFOOTEX   Lab Results  Component Value Date   CHOL 160 05/28/2021   HDL 51.80 05/28/2021   LDLCALC 72 05/28/2021   LDLDIRECT 84.0 01/04/2021   TRIG 179.0 (H) 05/28/2021   CHOLHDL 3 05/28/2021    Hepatic Function Latest Ref Rng & Units 01/04/2021 12/28/2019 12/29/2018  Total Protein 6.0 - 8.3 g/dL 6.3 6.0 6.4  Albumin 3.5 - 5.2 g/dL 4.2 4.1 4.3  AST 0 - 37 U/L '14 16 16  ' ALT 0 - 35 U/L '14 13 14  ' Alk Phosphatase 39 - 117 U/L 107 82 91  Total Bilirubin 0.2 - 1.2 mg/dL 0.4 0.4 0.4  Bilirubin, Direct 0.0 - 0.3 mg/dL 0.1 0.1 0.1    Lab Results  Component Value Date/Time   TSH 1.25 01/04/2021 10:07 AM   TSH 0.63 12/28/2019 09:53 AM   FREET4 0.88 01/04/2021 10:07 AM   FREET4 1.07 12/28/2019 09:53 AM    CBC Latest Ref Rng & Units 01/04/2021 12/28/2019 12/29/2018  WBC 4.0 - 10.5 K/uL 7.6 5.4 6.9  Hemoglobin 12.0 - 15.0 g/dL 13.3  13.7 14.1  Hematocrit 36.0 - 46.0 % 40.2 40.3 41.4  Platelets 150.0 - 400.0 K/uL 309.0 260.0 287.0    Lab Results  Component Value Date/Time   VD25OH 24 01/31/2019 12:00 AM    Clinical ASCVD: No  The ASCVD Risk score (Arnett DK, et al., 2019) failed to calculate for the following reasons:   The 2019 ASCVD risk score is only valid for ages 53 to 66    Depression screen PHQ 2/9 10/23/2021 01/09/2021 10/17/2020  Decreased Interest 0 0 0  Down, Depressed, Hopeless 0 0 0  PHQ - 2 Score 0 0 0      Social History   Tobacco Use  Smoking Status Former  Smokeless Tobacco Never   BP Readings from Last 3 Encounters:  01/09/21 120/62  12/03/20 136/78  01/04/20 126/62   Pulse Readings from Last 3 Encounters:  01/09/21 71  12/03/20 70  01/04/20 75   Wt Readings from Last 3 Encounters:  12/03/20 129 lb (58.5 kg)  11/11/19 130 lb (59 kg)  12/29/18 130 lb (59 kg)   BMI Readings from Last 3 Encounters:  01/09/21 24.78 kg/m  12/03/20 23.59 kg/m  01/04/20 23.78 kg/m    Assessment/Interventions: Review of patient past medical history, allergies, medications, health status, including review of consultants reports, laboratory and other test data, was performed as part of comprehensive evaluation and provision of chronic care management services.   SDOH:  (Social Determinants of Health) assessments and interventions performed: No  SDOH Screenings   Alcohol Screen: Not on file  Depression (PHQ2-9): Low Risk    PHQ-2 Score: 0  Financial Resource Strain: Low Risk    Difficulty of Paying Living Expenses: Not hard at all  Food Insecurity: No Food Insecurity   Worried About Charity fundraiser in the Last Year: Never true   Ran Out of Food in the Last Year: Never true  Housing: Not on file  Physical Activity: Insufficiently Active   Days of Exercise per Week: 1 day   Minutes of Exercise per Session: 50 min  Social Connections: Moderately Integrated   Frequency of Communication with Friends and Family: More than three times a week   Frequency of Social Gatherings with Friends and Family: More than three times a week   Attends Religious Services: More than 4 times per year   Active Member of [employer] or Organizations: No   Attends Archivist Meetings: Never   Marital Status: Married  Stress: No Stress Concern Present   Feeling of Stress : Only a little  Tobacco Use: Medium Risk   Smoking Tobacco Use: Former   Smokeless Tobacco Use: Never   Passive Exposure: Not on  file  Transportation Needs: No Transportation Needs   Lack of Transportation (Medical): No   Lack of Transportation (Non-Medical): No    CCM Care Plan  Allergies  Allergen Reactions   Amoxicillin-Pot Clavulanate     REACTION: colitis  after ceclor  adn augmentin years ago   Cefdinir     REACTION: "colitis" diarrhea    Medications Reviewed Today     Reviewed by [name], [facility] (Pharmacist) on 12/25/21 at [place] List Status: <None>   Medication Order Taking? Sig Documenting Provider Last Dose Status Informant  Cholecalciferol (VITAMIN D3) 2000 units TABS [id number] Yes Take 2,000 Units by mouth daily. [provider] Taking Active   Eluxadoline 75 MG TABS [id number] Yes Take by mouth as needed. [provider] Taking Active  estradiol (ESTRACE) 0.1 MG/GM vaginal cream [id number]  1 -2 gram intravaginally 2  x per week Panosh, Standley Brooking, MD  Active   Multiple Vitamins-Minerals (PRESERVISION AREDS 2 PO) [id number] Yes Take 1 tablet by mouth in the morning and at bedtime. [provider] Taking Active   simvastatin (ZOCOR) 20 MG tablet [id number] Yes TAKE 1 TABLET(20 MG) BY MOUTH AT BEDTIME Panosh, Standley Brooking, MD Taking Active   SYNTHROID 100 MCG tablet [id number] Yes TAKE 1 TABLET(100 MCG) BY MOUTH DAILY Panosh, Standley Brooking, MD Taking Active   tobramycin (TOBREX) 0.3 % ophthalmic solution [id number]    Patient not taking: Reported on 10/23/2021   [provider]  Active            Med Note Zigmund Daniel Oct 17, 2020 12:22 PM) As needed            Patient Active Problem List   Diagnosis Date Noted   Ex-smoker 08/13/2018   Atrophic vaginitis 03/04/2018   Menopausal problem 03/04/2018   Osteoarthritis 03/04/2018   Postmenopausal bleeding 03/04/2018   AMD (age related macular degeneration) 11/26/2016   IBS (irritable bowel syndrome) 11/24/2015   Nocturnal foot cramps 10/28/2013   Postmenopausal HRT (hormone replacement therapy)  11/06/2011   Other reasons for seeking consultation 10/01/2011   SINUS BRADYCARDIA 04/30/2010   OSTEOARTHRITIS, HAND 04/24/2009   HYPERLIPIDEMIA 02/02/2008   HAND PAIN 02/02/2008   SKIN CANCER, HX OF 02/02/2008   Hypothyroidism 08/30/2007   COLONIC POLYPS, HX OF 08/30/2007    Immunization History  Administered Date(s) Administered   Fluad Quad(high Dose 65+) 11/01/2019   Influenza Whole 10/22/2011   Influenza, High Dose Seasonal PF 10/28/2013, 10/24/2014, 11/21/2015, 09/30/2016, 10/22/2017, 10/25/2020   Influenza-Unspecified 11/05/2018, 10/21/2021   PFIZER(Purple Top)SARS-COV-2 Vaccination 01/06/2020, 01/27/2020, 09/25/2020   Pfizer Covid-19 Vaccine Bivalent Booster 38yr & up 09/07/2021   Pneumococcal Conjugate-13 10/24/2014   Pneumococcal Polysaccharide-23 12/22/2008   Td 12/22/2008   Zoster Recombinat (Shingrix) 07/06/2018, 10/05/2018   Zoster, Live 06/11/2010   Patient is doing pretty good right now. She had a nice Christmas dinner with friends and really enjoyed getting together with them. Patient is also very excited as she is down 24 lbs since last January. She is working hard on losing weight.   Her biggest concern right now is that she is having leg cramps at night and they are mostly while she is laying down in bed. She does drink vitamin water for electrolytes but inquired about trying magnesium to see if this helps with cramping. Patient will also drink more water to prevent dehydration which can cause cramping.   Conditions to be addressed/monitored:  Hyperlipidemia, Hypothyroidism, Depression, Osteopenia, Osteoarthritis and IBS, UTI prevention  Conditions addressed this visit: Hyperlipidemia, hypothyroidism  Care Plan : CCM Pharmacy Care Plan  Updates made by PViona Gilmore RTangentsince 12/30/2021 12:00 AM     Problem: Problem: Hyperlipidemia, Depression, Osteopenia, Osteoarthritis and IBS, UTI prevention      Goal: Patient-Specific Goal   Start Date:  05/03/2021  Expected End Date: 05/03/2022  Recent Progress: On track  Priority: High  Note:   Current Barriers:  Unable to independently monitor therapeutic efficacy  Pharmacist Clinical Goal(s):  Patient will achieve adherence to monitoring guidelines and medication adherence to achieve therapeutic efficacy through collaboration with PharmD and provider.   Interventions: 1:1 collaboration with Panosh, WStandley Brooking MD regarding development and update of comprehensive plan of care as evidenced by provider attestation and co-signature Inter-disciplinary care  team collaboration (see longitudinal plan of care) Comprehensive medication review performed; medication list updated in electronic medical record  Hyperlipidemia: (LDL goal < 100) -Controlled -Current treatment: Simvastatin 20 mg 1 tablet at bedtime - appropriate, effective, safe, accessible -Medications previously tried: none  -Current dietary patterns: patient eats leaner meats; gave up cookies and white bread and bagels and has lost about 14 lbs -Current exercise habits: teaches dance classes -Educated on Cholesterol goals;  Benefits of statin for ASCVD risk reduction; Importance of limiting foods high in cholesterol; Exercise goal of 150 minutes per week; -Counseled on diet and exercise extensively Recommended to continue current medication  Hypothyroidism (Goal: TSH 2.5-4.5 with osteopenia) -Controlled -Current treatment  Synthroid 100 mcg 1 tablet daily - appropriate, effective, query safe -Medications previously tried: none  -Recommended adjusting dose of medication to higher TSH target with osteopenia.   Osteopenia (Goal prevent fractures) -Uncontrolled -Last DEXA Scan: 02/08/2019              T-Score femoral neck: R -1.7, L -1.2             T-Score total hip: n/a             T-Score lumbar spine: -0.8             T-Score forearm radius: n/a             10-year probability of major osteoporotic fracture: 14%              10-year probability of hip fracture: 3.7% -Patient is a candidate for pharmacologic treatment due to T-Score -1.0 to -2.5 and 10-year risk of hip fracture > 3% -Current treatment  Vitamin D 2000 units 1 tablet daily -Medications previously tried: none  -Recommend [phone] units of vitamin D daily. Recommend 1200 mg of calcium daily from dietary and supplemental sources. Recommend weight-bearing and muscle strengthening exercises for building and maintaining bone density. -Recommended walking for weight bearing exercise  Osteoarthritis (Goal: minimize pain) -Not ideally controlled -Current treatment  Meloxicam as needed (takes occassionally) - appropriate, effective, safe, accessible Tylenol as needed - appropriate, effective, safe, accessible -Medications previously tried: none  -Counseled on using Tylenol vs ibuprofen to reduce risk of bleeding with concurrent aspirin therapy  IBS (Goal: minimize symptoms) -Controlled -Current treatment  Viberzi 75 mg 1 tablet as needed - appropriate, effective, safe, accessible -Medications previously tried: none  -Recommended to continue current medication  UTI prevention (Goal: minimize symptoms) -Controlled -Current treatment  Estradiol 0.1 mg/g vaginal cream as needed - appropriate, effective, safe, accessible -Medications previously tried: none  -Recommended to continue current medication   Health Maintenance -Vaccine gaps: tetanus -Current therapy:  Preservision twice daily -Educated on Cost vs benefit of each product must be carefully weighed by individual consumer -Patient is satisfied with current therapy and denies issues -Recommended to continue current medication  Patient Goals/Self-Care Activities Patient will:  - take medications as prescribed target a minimum of 150 minutes of moderate intensity exercise weekly  Follow Up Plan: The care management team will reach out to the patient again over the next 60 days.          Medication Assistance: None required.  Patient affirms current coverage meets needs.  Compliance/Adherence/Medication fill history: Care Gaps: Tetanus   Star-Rating Drugs: Simvastatin (Zocor) 20 mg - Last filled 08/25/2021 90 DS at [facility]  Patient's preferred pharmacy is:  [facility] [address]  [place] [address] Phone: [phone] Fax: [phone]  Uses pill box? Yes - 7 day pill box Pt endorses 100% compliance  We discussed: Current pharmacy is preferred with insurance plan and patient is satisfied with pharmacy services Patient decided to: Continue current medication management strategy  Care Plan and Follow Up Patient Decision:  Patient agrees to Care Plan and Follow-up.  Plan: The care management team will reach out to the patient again over the next 60 days.  Jeni Salles, PharmD [facility] Clinical Pharmacist [place] [facility] [place]

## 2021-12-30 NOTE — Patient Instructions (Signed)
Hi Christina Myers,  It was great to speak with you again! Go ahead and try to drink more water and take around 400 to 500 mg of magnesium every day to see if this helps with cramping.  Please reach out to me if you have any questions or need anything!  Best, Maddie  Jeni Salles, PharmD, [place] [facility] [place]   Visit Information   Goals Addressed   None    Patient Care Plan: CCM Pharmacy Care Plan     Problem Identified: Problem: Hyperlipidemia, Depression, Osteopenia, Osteoarthritis and IBS, UTI prevention      Goal: Patient-Specific Goal   Start Date: 05/03/2021  Expected End Date: 05/03/2022  Recent Progress: On track  Priority: High  Note:   Current Barriers:  Unable to independently monitor therapeutic efficacy  Pharmacist Clinical Goal(s):  Patient will achieve adherence to monitoring guidelines and medication adherence to achieve therapeutic efficacy through collaboration with PharmD and provider.   Interventions: 1:1 collaboration with Panosh, Standley Brooking, MD regarding development and update of comprehensive plan of care as evidenced by provider attestation and co-signature Inter-disciplinary care team collaboration (see longitudinal plan of care) Comprehensive medication review performed; medication list updated in electronic medical record  Hyperlipidemia: (LDL goal < 100) -Controlled -Current treatment: Simvastatin 20 mg 1 tablet at bedtime - appropriate, effective, safe, accessible -Medications previously tried: none  -Current dietary patterns: patient eats leaner meats; gave up cookies and white bread and bagels and has lost about 14 lbs -Current exercise habits: teaches dance classes -Educated on Cholesterol goals;  Benefits of statin for ASCVD risk reduction; Importance of limiting foods high in cholesterol; Exercise goal of 150 minutes per week; -Counseled on diet and exercise extensively Recommended to continue  current medication  Hypothyroidism (Goal: TSH 2.5-4.5 with osteopenia) -Controlled -Current treatment  Synthroid 100 mcg 1 tablet daily - appropriate, effective, query safe -Medications previously tried: none  -Recommended adjusting dose of medication to higher TSH target with osteopenia.   Osteopenia (Goal prevent fractures) -Uncontrolled -Last DEXA Scan: 02/08/2019              T-Score femoral neck: R -1.7, L -1.2             T-Score total hip: n/a             T-Score lumbar spine: -0.8             T-Score forearm radius: n/a             10-year probability of major osteoporotic fracture: 14%             10-year probability of hip fracture: 3.7% -Patient is a candidate for pharmacologic treatment due to T-Score -1.0 to -2.5 and 10-year risk of hip fracture > 3% -Current treatment  Vitamin D 2000 units 1 tablet daily -Medications previously tried: none  -Recommend [phone] units of vitamin D daily. Recommend 1200 mg of calcium daily from dietary and supplemental sources. Recommend weight-bearing and muscle strengthening exercises for building and maintaining bone density. -Recommended walking for weight bearing exercise  Osteoarthritis (Goal: minimize pain) -Not ideally controlled -Current treatment  Meloxicam as needed (takes occassionally) - appropriate, effective, safe, accessible Tylenol as needed - appropriate, effective, safe, accessible -Medications previously tried: none  -Counseled on using Tylenol vs ibuprofen to reduce risk of bleeding with concurrent aspirin therapy  IBS (Goal: minimize symptoms) -Controlled -Current treatment  Viberzi 75 mg 1 tablet as needed - appropriate, effective, safe, accessible -Medications  previously tried: none  -Recommended to continue current medication  UTI prevention (Goal: minimize symptoms) -Controlled -Current treatment  Estradiol 0.1 mg/g vaginal cream as needed - appropriate, effective, safe, accessible -Medications  previously tried: none  -Recommended to continue current medication   Health Maintenance -Vaccine gaps: tetanus -Current therapy:  Preservision twice daily -Educated on Cost vs benefit of each product must be carefully weighed by individual consumer -Patient is satisfied with current therapy and denies issues -Recommended to continue current medication  Patient Goals/Self-Care Activities Patient will:  - take medications as prescribed target a minimum of 150 minutes of moderate intensity exercise weekly  Follow Up Plan: The care management team will reach out to the patient again over the next 60 days.         Patient verbalizes understanding of instructions provided today and agrees to view in [place].  The pharmacy team will reach out to the patient again over the next 60 days.   [name], [facility]

## 2021-12-31 ENCOUNTER — Other Ambulatory Visit: Payer: Self-pay | Admitting: Internal Medicine

## 2021-12-31 DIAGNOSIS — R252 Cramp and spasm: Secondary | ICD-10-CM

## 2021-12-31 DIAGNOSIS — Z79899 Other long term (current) drug therapy: Secondary | ICD-10-CM

## 2022-01-01 ENCOUNTER — Other Ambulatory Visit (INDEPENDENT_AMBULATORY_CARE_PROVIDER_SITE_OTHER): Payer: Medicare Other

## 2022-01-01 DIAGNOSIS — E039 Hypothyroidism, unspecified: Secondary | ICD-10-CM

## 2022-01-01 DIAGNOSIS — Z79899 Other long term (current) drug therapy: Secondary | ICD-10-CM

## 2022-01-01 DIAGNOSIS — M1990 Unspecified osteoarthritis, unspecified site: Secondary | ICD-10-CM

## 2022-01-01 DIAGNOSIS — E782 Mixed hyperlipidemia: Secondary | ICD-10-CM

## 2022-01-01 DIAGNOSIS — R252 Cramp and spasm: Secondary | ICD-10-CM

## 2022-01-01 DIAGNOSIS — K589 Irritable bowel syndrome without diarrhea: Secondary | ICD-10-CM | POA: Diagnosis not present

## 2022-01-01 LAB — BASIC METABOLIC PANEL
BUN: 17 mg/dL (ref 6–23)
CO2: 30 mEq/L (ref 19–32)
Calcium: 9.2 mg/dL (ref 8.4–10.5)
Chloride: 105 mEq/L (ref 96–112)
Creatinine, Ser: 0.82 mg/dL (ref 0.40–1.20)
GFR: 66.54 mL/min (ref >60.00)
Glucose, Bld: 92 mg/dL (ref 70–99)
Potassium: 4.1 mEq/L (ref 3.5–5.1)
Sodium: 141 mEq/L (ref 135–145)

## 2022-01-01 LAB — HEPATIC FUNCTION PANEL
ALT: 15 U/L (ref 0–35)
AST: 15 U/L (ref 0–37)
Albumin: 4.1 g/dL (ref 3.5–5.2)
Alkaline Phosphatase: 90 U/L (ref 39–117)
Bilirubin, Direct: 0.1 mg/dL (ref 0.0–0.3)
Total Bilirubin: 0.6 mg/dL (ref 0.2–1.2)
Total Protein: 6.7 g/dL (ref 6.0–8.3)

## 2022-01-01 LAB — CBC WITH DIFFERENTIAL/PLATELET
Basophils Absolute: 0 10*3/uL (ref 0.0–0.1)
Basophils Relative: 0.5 % (ref 0.0–3.0)
Eosinophils Absolute: 0.1 10*3/uL (ref 0.0–0.7)
Eosinophils Relative: 1.6 % (ref 0.0–5.0)
HCT: 44.1 % (ref 36.0–46.0)
Hemoglobin: 14.5 g/dL (ref 12.0–15.0)
Lymphocytes Relative: 30 % (ref 12.0–46.0)
Lymphs Abs: 2.2 10*3/uL (ref 0.7–4.0)
MCHC: 33 g/dL (ref 30.0–36.0)
MCV: 95.8 fl (ref 78.0–100.0)
Monocytes Absolute: 0.6 10*3/uL (ref 0.1–1.0)
Monocytes Relative: 8.5 % (ref 3.0–12.0)
Neutro Abs: 4.4 10*3/uL (ref 1.4–7.7)
Neutrophils Relative %: 59.4 % (ref 43.0–77.0)
Platelets: 290 10*3/uL (ref 150.0–400.0)
RBC: 4.6 Mil/uL (ref 3.87–5.11)
RDW: 14.6 % (ref 11.5–15.5)
WBC: 7.4 10*3/uL (ref 4.0–10.5)

## 2022-01-01 LAB — TSH: TSH: 0.68 u[IU]/mL (ref 0.35–5.50)

## 2022-01-01 LAB — LIPID PANEL
Cholesterol: 169 mg/dL (ref 0–200)
HDL: 62.7 mg/dL (ref >39.00)
LDL Cholesterol: 81 mg/dL (ref 0–99)
NonHDL: 106.14
Total CHOL/HDL Ratio: 3
Triglycerides: 126 mg/dL (ref 0.0–149.0)
VLDL: 25.2 mg/dL (ref 0.0–40.0)

## 2022-01-01 LAB — VITAMIN D 25 HYDROXY (VIT D DEFICIENCY, FRACTURES): VITD: 45.52 ng/mL (ref 30.00–100.00)

## 2022-01-02 NOTE — Progress Notes (Signed)
Results are  in normal range  / will review at upcoming  visit

## 2022-01-07 NOTE — Progress Notes (Signed)
Chief Complaint  Patient presents with   Annual Exam    HPI: Patient  Christina Myers  81 y.o. comes in today for Preventive Health Care visit and medication management  Diet changes  from last year is pleased with her intentional weight loss and feels that she is at a good weight.  She had cut out simple carbs calorie beverages and has maintained this weight since the fall. 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  This was stressful but things are better Thyroid medicine daily HLD statin medicine taken She did have a problem with leg cramps however on the advice of the pharmacist she has tried hydration and magnesium 500 mg at night and her leg cramps are better.  Health Maintenance  Topic Date Due   TETANUS/TDAP  12/22/2018   Pneumonia Vaccine 53+ Years old  Completed   INFLUENZA VACCINE  Completed   DEXA SCAN  Completed   COVID-19 Vaccine  Completed   Zoster Vaccines- Shingrix  Completed   HPV VACCINES  Aged Out   Health Maintenance Review LIFESTYLE:  Exercise:  Y Tobacco/ETS:n Alcohol: n Sugar beverages:n Sleep:ok  Drug use: no HH of 2 Work:retired last year  Hip some bothersome buyt diubg wekk    ROS:  REST of 12 system review negative except as per HPI   Past Medical History:  Diagnosis Date   COLONIC POLYPS, HX OF 08/30/2007   Qualifier: Diagnosis of  By: Rogue Bussing CMA, Jacqualynn     Hx of colonic polyps    Hypertension    Hypothyroidism    OSTEOARTHRITIS, HAND 04/24/2009   Qualifier: Diagnosis of  By: Regis Bill MD, Standley Brooking    PVC (premature ventricular contraction)    [facility] evaluation 7/03   Seasonal allergies    Skin cancer    hx of bcca of face   SKIN CANCER, HX OF 02/02/2008   Qualifier: Diagnosis of  By: Regis Bill MD, Standley Brooking     Past Surgical History:  Procedure Laterality Date   FACIAL COSMETIC SURGERY     TONSILLECTOMY     tubal lig      Family History  Problem Relation Age of Onset   Cancer Other        colon, prostate    Diabetes Other     Stroke Other    Heart disease Other    Colon cancer Mother     Social History   Socioeconomic History   Marital status: Married    Spouse name: Not on file   Number of children: Not on file   Years of education: Not on file   Highest education level: Not on file  Occupational History   Not on file  Tobacco Use   Smoking status: Former   Smokeless tobacco: Never  Substance and Sexual Activity   Alcohol use: Yes    Comment: rarely   Drug use: No   Sexual activity: Not on file  Other Topics Concern   Not on file  Social History Narrative   Married    Former smoker   Set designer and teaches tap dancing.    No etoh   HH of 2    Social Determinants of Radio broadcast assistant Strain: Low Risk    Difficulty of Paying Living Expenses: Not hard at all  Food Insecurity: No Food Insecurity   Worried About Charity fundraiser in the Last Year: Never true   Kennan in the Last Year:  Never true  Transportation Needs: No Transportation Needs   Lack of Transportation (Medical): No   Lack of Transportation (Non-Medical): No  Physical Activity: Insufficiently Active   Days of Exercise per Week: 1 day   Minutes of Exercise per Session: 50 min  Stress: No Stress Concern Present   Feeling of Stress : Only a little  Social Connections: Moderately Integrated   Frequency of Communication with Friends and Family: More than three times a week   Frequency of Social Gatherings with Friends and Family: More than three times a week   Attends Religious Services: More than 4 times per year   Active Member of [employer] or Organizations: No   Attends Archivist Meetings: Never   Marital Status: Married    Outpatient Medications Prior to Visit  Medication Sig Dispense Refill   Cholecalciferol (VITAMIN D3) 2000 units TABS Take 2,000 Units by mouth daily.     Eluxadoline 75 MG TABS Take by mouth as needed.     estradiol (ESTRACE) 0.1 MG/GM vaginal cream 1 -2 gram  intravaginally 2  x per week 42.5 g 3   magnesium gluconate (MAGONATE) 500 MG tablet Take 500 mg by mouth daily.     Multiple Vitamins-Minerals (PRESERVISION AREDS 2 PO) Take 1 tablet by mouth in the morning and at bedtime.     tobramycin (TOBREX) 0.3 % ophthalmic solution   4   simvastatin (ZOCOR) 20 MG tablet TAKE 1 TABLET(20 MG) BY MOUTH AT BEDTIME 90 tablet 0   SYNTHROID 100 MCG tablet TAKE 1 TABLET(100 MCG) BY MOUTH DAILY 90 tablet 1   No facility-administered medications prior to visit.     EXAM:  BP 120/74 (BP Location: Left Arm, Patient Position: Sitting, Cuff Size: Normal)    Pulse 62    Temp 98 F (36.7 C) (Oral)    Ht 5' 0.5" (1.537 m)    Wt 108 lb 3.2 oz (49.1 kg)    SpO2 98%    BMI 20.78 kg/m   Body mass index is 20.78 kg/m. Wt Readings from Last 3 Encounters:  01/08/22 108 lb 3.2 oz (49.1 kg)  12/03/20 129 lb (58.5 kg)  11/11/19 130 lb (59 kg)   Wt Readings from Last 3 Encounters:  01/08/22 108 lb 3.2 oz (49.1 kg)  12/03/20 129 lb (58.5 kg)  11/11/19 130 lb (59 kg)    Physical Exam: Vital signs reviewed XTG:GYIR is a well-developed well-nourished alert cooperative    who appearsr younger than stated age in no acute distress.  HEENT: normocephalic atraumatic , Eyes: PERRL EOM's full, conjunctiva clear, Nares: paten,t no deformity discharge or tenderness., Ears: no deformity EAC's clear TMs with normal landmarks. Mouth: masked  NECK: supple without masses, thyromegaly or bruits. CHEST/PULM:  Clear to auscultation and percussion breath sounds equal no wheeze , rales or rhonchi.  Breast: normal by inspection . No dimpling, discharge, masses, tenderness or discharge . CV: PMI is nondisplaced, S1 S2 no gallops, murmurs, rubs. Peripheral pulses are present without delay.No JVD .  ABDOMEN: Bowel sounds normal nontender  No guard or rebound, no hepato splenomegal no CVA tenderness.   Extremtities:  No clubbing cyanosis or edema, no acute joint swelling or redness no focal  atrophy NEURO:  Oriented x3, cranial nerves 3-12 appear to be intact, no obvious focal weakness,gait within normal limits no abnormal reflexes or asymmetrical SKIN: No acute rashes normal turgor, color, no bruising or petechiae. PSYCH: Oriented, good eye contact, no obvious depression anxiety, cognition and judgment  appear normal. LN: no cervical axillary inguinal adenopathy  Lab Results  Component Value Date   WBC 7.4 01/01/2022   HGB 14.5 01/01/2022   HCT 44.1 01/01/2022   PLT 290.0 01/01/2022   GLUCOSE 92 01/01/2022   CHOL 169 01/01/2022   TRIG 126.0 01/01/2022   HDL 62.70 01/01/2022   LDLDIRECT 84.0 01/04/2021   LDLCALC 81 01/01/2022   ALT 15 01/01/2022   AST 15 01/01/2022   NA 141 01/01/2022   K 4.1 01/01/2022   CL 105 01/01/2022   CREATININE 0.82 01/01/2022   BUN 17 01/01/2022   CO2 30 01/01/2022   TSH 0.68 01/01/2022   INR 1.0 ratio 11/27/2010    BP Readings from Last 3 Encounters:  01/08/22 120/74  01/09/21 120/62  12/03/20 136/78    Lab results reviewed with patient   ASSESSMENT AND PLAN:  Discussed the following assessment and plan:    ICD-10-CM   1. Visit for preventive health examination  Z00.00     2. Hypothyroidism, unspecified type  E03.9     3. Medication management  Z79.899     4. HYPERLIPIDEMIA  E78.2      Continue healthy lifestyle continue medications in range Follow-up 1 year or as needed. Can continue magnesium 500 mg a day Return in about 1 year (around 01/08/2023) for preventive /cpx and medications.  Patient Care Team: Aurea Aronov, Standley Brooking, MD as PCP - Sandford Craze, MD (Dermatology) Richmond Campbell, MD (Gastroenterology) Elsie Saas, MD (Orthopedic Surgery) Sherlynn Stalls, MD as Consulting Physician (Ophthalmology) [place], Heather, [place] (Optometry) [name], [facility] as Pharmacist (Pharmacist) Patient Instructions  Good to see you today . Lab results are good. Refilled medicaiton today . Continue lifestyle  intervention healthy eating and exercise .    Lab Results  Component Value Date   WBC 7.4 01/01/2022   HGB 14.5 01/01/2022   HCT 44.1 01/01/2022   PLT 290.0 01/01/2022   GLUCOSE 92 01/01/2022   CHOL 169 01/01/2022   TRIG 126.0 01/01/2022   HDL 62.70 01/01/2022   LDLDIRECT 84.0 01/04/2021   LDLCALC 81 01/01/2022   ALT 15 01/01/2022   AST 15 01/01/2022   NA 141 01/01/2022   K 4.1 01/01/2022   CL 105 01/01/2022   CREATININE 0.82 01/01/2022   BUN 17 01/01/2022   CO2 30 01/01/2022   TSH 0.68 01/01/2022   INR 1.0 ratio 11/27/2010     Rital Cavey K. Jerrilyn Messinger M.D.

## 2022-01-08 ENCOUNTER — Ambulatory Visit (INDEPENDENT_AMBULATORY_CARE_PROVIDER_SITE_OTHER): Payer: Medicare Other | Admitting: Internal Medicine

## 2022-01-08 ENCOUNTER — Encounter: Payer: Self-pay | Admitting: Internal Medicine

## 2022-01-08 VITALS — BP 120/74 | HR 62 | Temp 98.0°F | Ht 60.5 in | Wt 108.2 lb

## 2022-01-08 DIAGNOSIS — Z0000 Encounter for general adult medical examination without abnormal findings: Secondary | ICD-10-CM

## 2022-01-08 DIAGNOSIS — Z79899 Other long term (current) drug therapy: Secondary | ICD-10-CM | POA: Diagnosis not present

## 2022-01-08 DIAGNOSIS — E039 Hypothyroidism, unspecified: Secondary | ICD-10-CM | POA: Diagnosis not present

## 2022-01-08 DIAGNOSIS — E782 Mixed hyperlipidemia: Secondary | ICD-10-CM | POA: Diagnosis not present

## 2022-01-08 MED ORDER — SIMVASTATIN 20 MG PO TABS: ORAL_TABLET | ORAL | 3 refills | Status: DC

## 2022-01-08 MED ORDER — SYNTHROID 100 MCG PO TABS: ORAL_TABLET | ORAL | 3 refills | Status: DC

## 2022-01-08 NOTE — Patient Instructions (Signed)
Good to see you today . Lab results are good. Refilled medicaiton today . Continue lifestyle intervention healthy eating and exercise .    Lab Results  Component Value Date   WBC 7.4 01/01/2022   HGB 14.5 01/01/2022   HCT 44.1 01/01/2022   PLT 290.0 01/01/2022   GLUCOSE 92 01/01/2022   CHOL 169 01/01/2022   TRIG 126.0 01/01/2022   HDL 62.70 01/01/2022   LDLDIRECT 84.0 01/04/2021   LDLCALC 81 01/01/2022   ALT 15 01/01/2022   AST 15 01/01/2022   NA 141 01/01/2022   K 4.1 01/01/2022   CL 105 01/01/2022   CREATININE 0.82 01/01/2022   BUN 17 01/01/2022   CO2 30 01/01/2022   TSH 0.68 01/01/2022   INR 1.0 ratio 11/27/2010

## 2022-01-09 IMAGING — MG MM DIGITAL SCREENING BILAT W/ TOMO AND CAD
6 of 10 series · 6 of 30 positions shown · non-contrast
Comparison: Previous exam(s).

CLINICAL DATA: Screening.

EXAM:
DIGITAL SCREENING BILATERAL MAMMOGRAM WITH TOMOSYNTHESIS AND CAD
TECHNIQUE: Bilateral screening digital craniocaudal and mediolateral oblique
mammograms were obtained. Bilateral screening digital breast
tomosynthesis was performed. The images were evaluated with
computer-aided detection.

[L CC synth-2D]
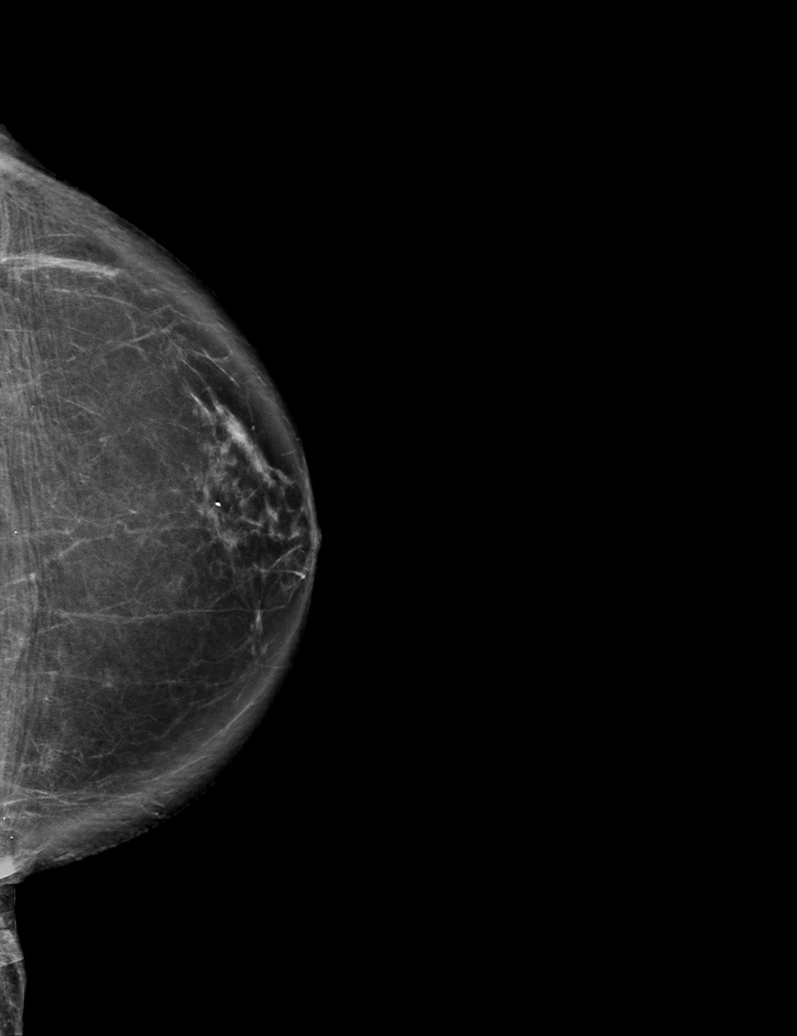

[L MLO synth-2D (1 of 2)]
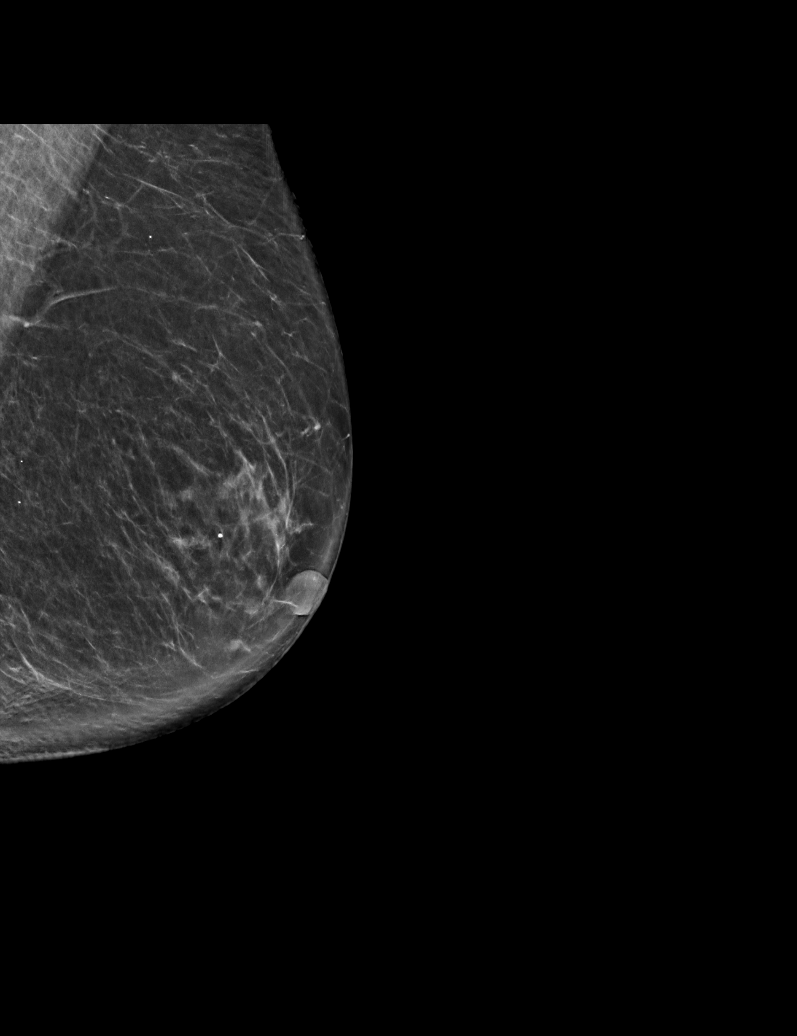

[R MLO synth-2D]
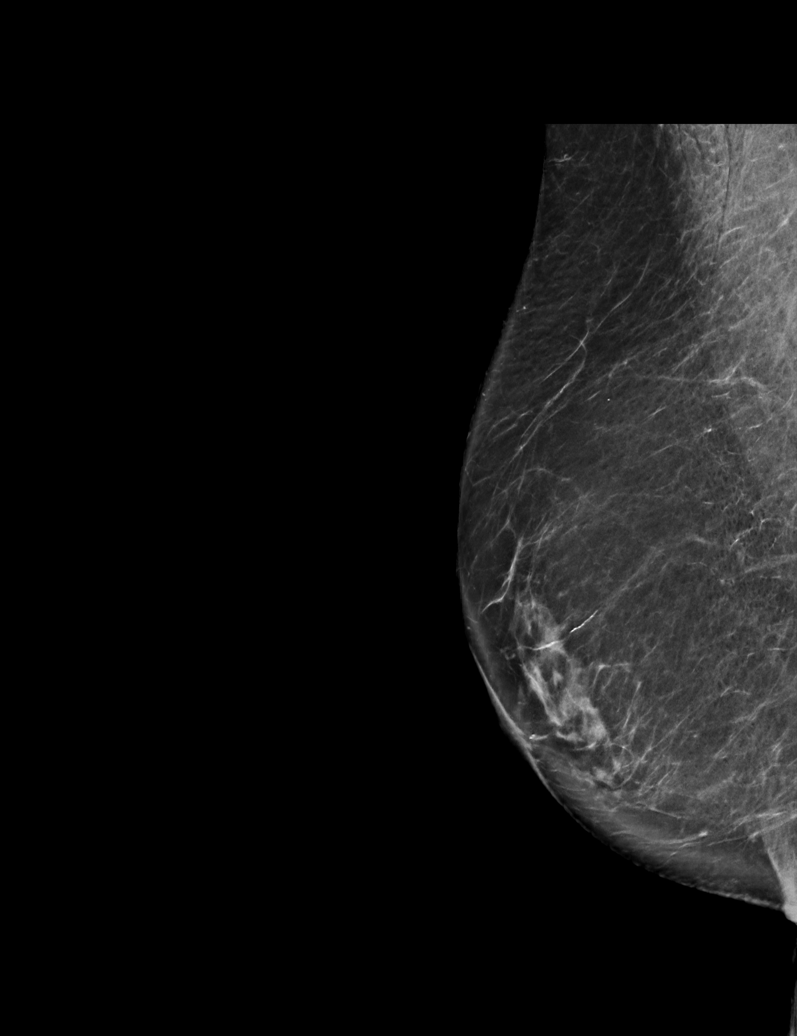

[L MLO synth-2D (2 of 2)]
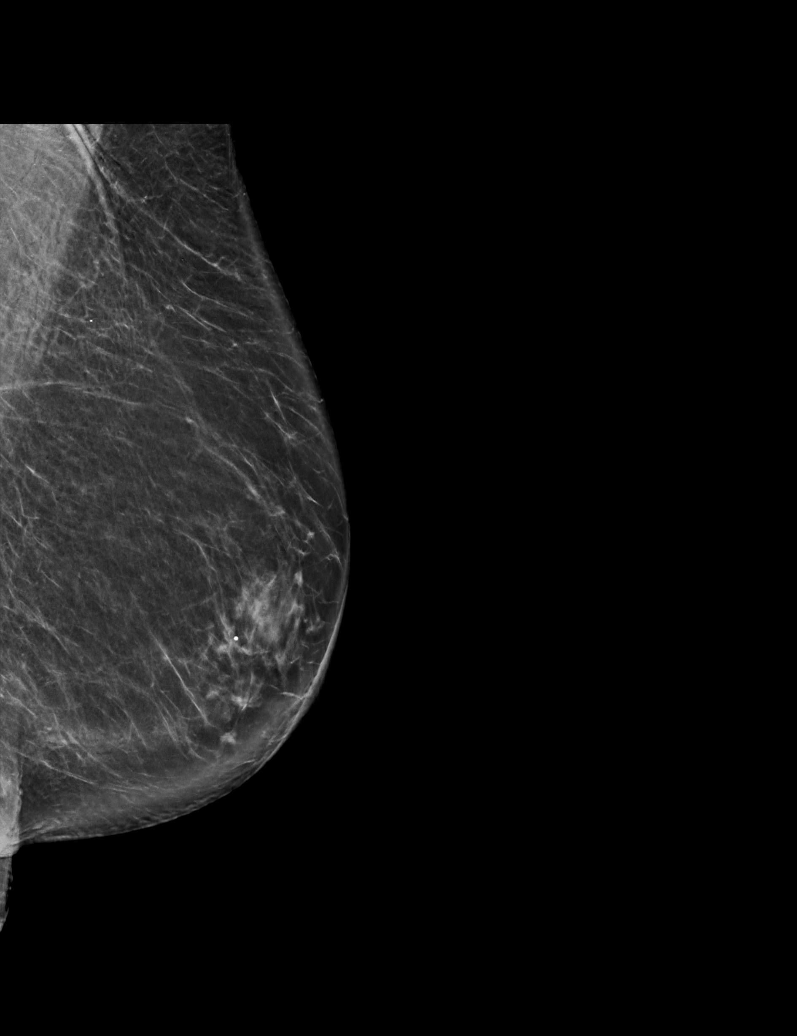

[R CC synth-2D]
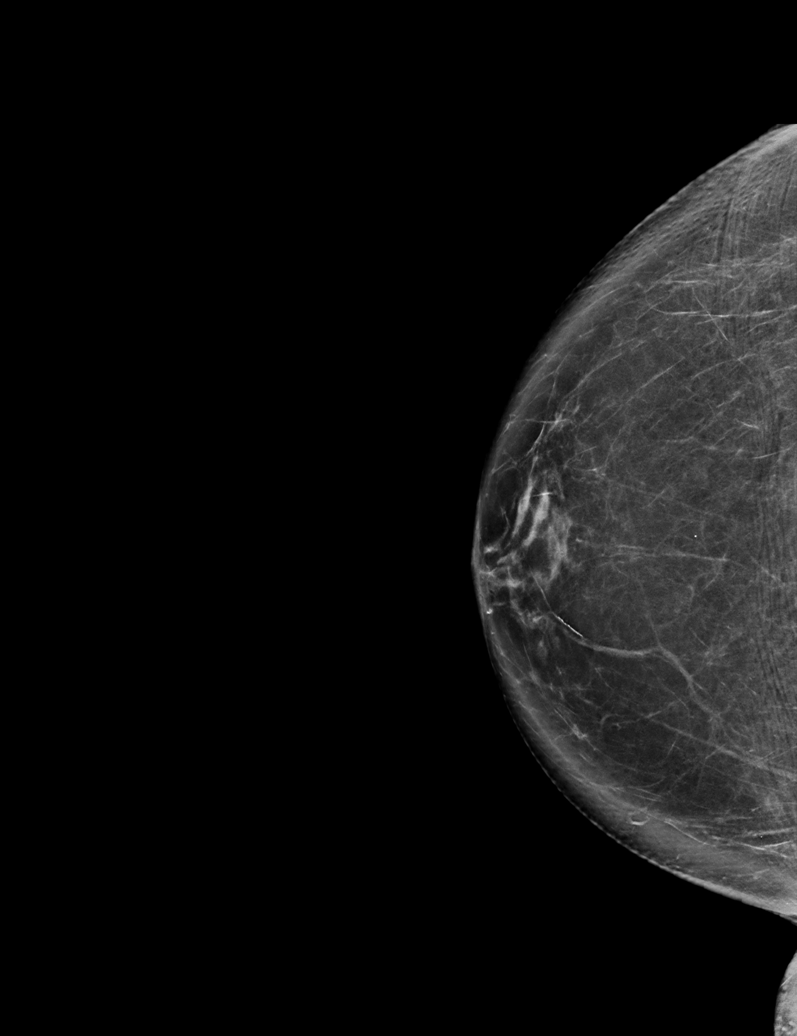

[L CC tomo · tomo slice 41/81.0]
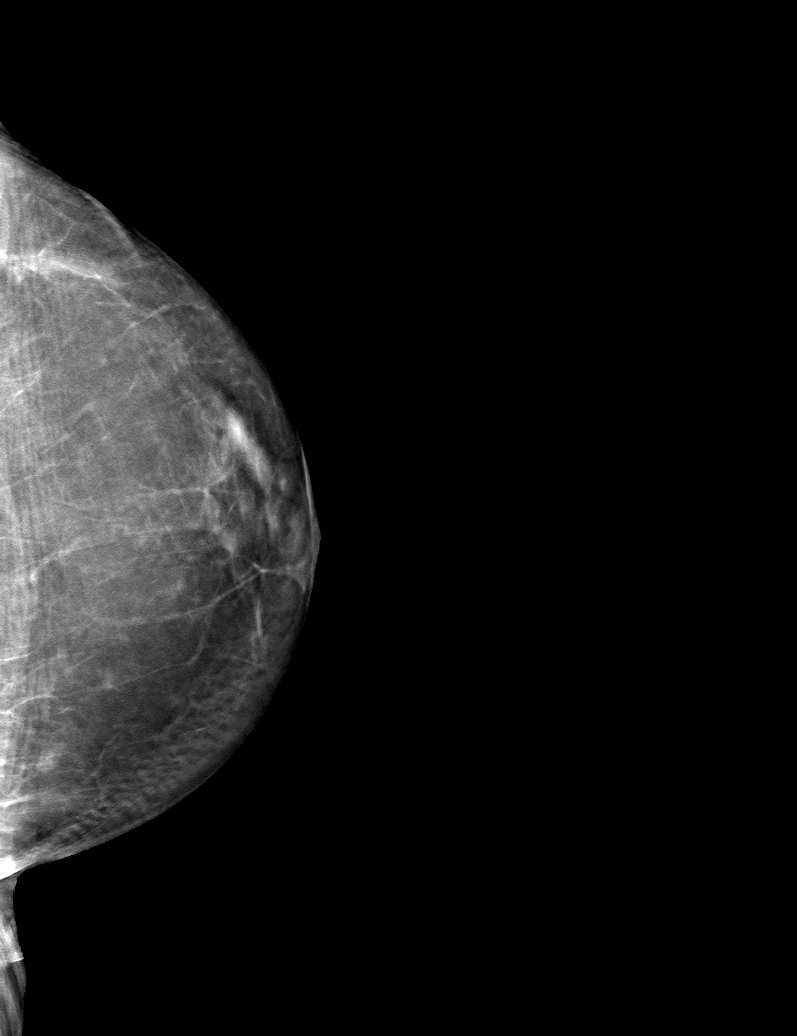

[6 of 30 positions shown; findings below may reference images not displayed]

ACR Breast Density Category b: There are scattered areas of
fibroglandular density.
FINDINGS: There are no findings suspicious for malignancy.
IMPRESSION: No mammographic evidence of malignancy. A result letter of this
screening mammogram will be mailed directly to the patient.

RECOMMENDATION:
Screening mammogram in one year. (Code:[id number]-[id number])

BI-RADS CATEGORY  1: Negative.

## 2022-01-14 DIAGNOSIS — H353112 Nonexudative age-related macular degeneration, right eye, intermediate dry stage: Secondary | ICD-10-CM | POA: Diagnosis not present

## 2022-01-14 DIAGNOSIS — H43813 Vitreous degeneration, bilateral: Secondary | ICD-10-CM | POA: Diagnosis not present

## 2022-01-14 DIAGNOSIS — H35423 Microcystoid degeneration of retina, bilateral: Secondary | ICD-10-CM | POA: Diagnosis not present

## 2022-01-14 DIAGNOSIS — H353221 Exudative age-related macular degeneration, left eye, with active choroidal neovascularization: Secondary | ICD-10-CM | POA: Diagnosis not present

## 2022-01-21 DIAGNOSIS — E782 Mixed hyperlipidemia: Secondary | ICD-10-CM

## 2022-01-21 DIAGNOSIS — E039 Hypothyroidism, unspecified: Secondary | ICD-10-CM

## 2022-02-06 DIAGNOSIS — Z85828 Personal history of other malignant neoplasm of skin: Secondary | ICD-10-CM | POA: Diagnosis not present

## 2022-02-06 DIAGNOSIS — C44622 Squamous cell carcinoma of skin of right upper limb, including shoulder: Secondary | ICD-10-CM | POA: Diagnosis not present

## 2022-02-11 ENCOUNTER — Telehealth: Payer: Self-pay | Admitting: Pharmacist

## 2022-02-11 NOTE — Chronic Care Management (AMB) (Signed)
Chronic Care Management Pharmacy Assistant   Name: Christina Myers  MRN: [id number] DOB: Jul 26, 1941  Reason for Encounter: Disease State / Hyperlipidemia Assessment Call   Conditions to be addressed/monitored: HLD   Recent office visits:  01/08/2022 Shanon Ace MD - Patient was seen for preventive health examination and additional issues. No medication changes. Follow up in 1 year.  Recent consult visits:  None  Hospital visits:  None Medications: Outpatient Encounter Medications as of 02/11/2022  Medication Sig Note   Cholecalciferol (VITAMIN D3) 2000 units TABS Take 2,000 Units by mouth daily.    Eluxadoline 75 MG TABS Take by mouth as needed.    estradiol (ESTRACE) 0.1 MG/GM vaginal cream 1 -2 gram intravaginally 2  x per week    magnesium gluconate (MAGONATE) 500 MG tablet Take 500 mg by mouth daily.    Multiple Vitamins-Minerals (PRESERVISION AREDS 2 PO) Take 1 tablet by mouth in the morning and at bedtime.    simvastatin (ZOCOR) 20 MG tablet TAKE 1 TABLET(20 MG) BY MOUTH AT BEDTIME    SYNTHROID 100 MCG tablet TAKE 1 TABLET(100 MCG) BY MOUTH DAILY    tobramycin (TOBREX) 0.3 % ophthalmic solution  10/17/2020: As needed   No facility-administered encounter medications on file as of 02/11/2022.  Fill History: SYNTHROID 0.1MG  (100MCG)TABLETS 11/28/2021 90   SIMVASTATIN 20MG  TABLETS 11/28/2021 90   02/11/2022 Name: Christina Myers MRN: [id number] DOB: Aug 16, 1941 Christina Myers is a 81 y.o. year old female who is a primary care patient of Panosh, Standley Brooking, MD.  Comprehensive medication review performed; Spoke to patient regarding cholesterol  Lipid Panel    Component Value Date/Time   CHOL 169 01/01/2022 0952   TRIG 126.0 01/01/2022 0952   TRIG 191 (H) 11/17/2006 1019   HDL 62.70 01/01/2022 0952   LDLCALC 81 01/01/2022 0952   LDLDIRECT 84.0 01/04/2021 1007    10-year ASCVD risk score: The ASCVD Risk score (Arnett DK, et al., 2019) failed to calculate for the  following reasons:   The 2019 ASCVD risk score is only valid for ages 59 to 50  Current antihyperlipidemic regimen:  Simvastatin 20 mg daily  Previous antihyperlipidemic medications tried: No other cholesterol medications tries.   ASCVD risk enhancing conditions: age >61  What recent interventions/DTPs have been made by any provider to improve Cholesterol control since last CPP Visit:  Patient is to be drinking more water and taking magnesium to help with cramping.    Is water and magnesium helping with cramping?  Is cramping better or worse?   Patient states she is doing much better, this has helped her cramping and she is now doing fine with Simvastatin.    Any recent hospitalizations or ED visits since last visit with CPP? No recent hospital visits.   What diet changes have been made to improve Cholesterol?  Patient is not following any specific diet. Breakfast - Patient will have yogurt with fruit or a bagel thin with cream cheese and smoked salmon Lunch - Patient will have chicken salad, sea food salad or an egg bite with fruit.  Dinner - Patient will have   What exercise is being done to improve Cholesterol?  Patient is going to a dance class once weekly, walks when shopping and house work.   Adherence Review: Does the patient have >5 day gap between last estimated fill dates? No  Care Gaps: AWV - scheduled 11/04/2022 Last BP - 120/74 on 01/08/2022 TDAP - overdue  Star Rating Drugs: Simvastatin  20 mg - last filled 11/28/2021 90 DS at [facility] [phone]

## 2022-02-21 ENCOUNTER — Telehealth: Payer: Self-pay | Admitting: Pharmacist

## 2022-02-21 NOTE — Chronic Care Management (AMB) (Signed)
    Chronic Care Management Pharmacy Assistant  Done in error 

## 2022-02-23 ENCOUNTER — Other Ambulatory Visit: Payer: Self-pay | Admitting: Internal Medicine

## 2022-02-28 ENCOUNTER — Telehealth: Payer: Self-pay

## 2022-02-28 MED ORDER — SIMVASTATIN 20 MG PO TABS: ORAL_TABLET | ORAL | 3 refills | Status: DC

## 2022-03-04 DIAGNOSIS — H353112 Nonexudative age-related macular degeneration, right eye, intermediate dry stage: Secondary | ICD-10-CM | POA: Diagnosis not present

## 2022-03-04 DIAGNOSIS — H43392 Other vitreous opacities, left eye: Secondary | ICD-10-CM | POA: Diagnosis not present

## 2022-03-04 DIAGNOSIS — H43813 Vitreous degeneration, bilateral: Secondary | ICD-10-CM | POA: Diagnosis not present

## 2022-03-04 DIAGNOSIS — H35371 Puckering of macula, right eye: Secondary | ICD-10-CM | POA: Diagnosis not present

## 2022-04-30 DIAGNOSIS — H353112 Nonexudative age-related macular degeneration, right eye, intermediate dry stage: Secondary | ICD-10-CM | POA: Diagnosis not present

## 2022-04-30 DIAGNOSIS — H353221 Exudative age-related macular degeneration, left eye, with active choroidal neovascularization: Secondary | ICD-10-CM | POA: Diagnosis not present

## 2022-04-30 DIAGNOSIS — H43392 Other vitreous opacities, left eye: Secondary | ICD-10-CM | POA: Diagnosis not present

## 2022-04-30 DIAGNOSIS — H35371 Puckering of macula, right eye: Secondary | ICD-10-CM | POA: Diagnosis not present

## 2022-05-15 ENCOUNTER — Other Ambulatory Visit: Payer: Self-pay | Admitting: Internal Medicine

## 2022-06-23 ENCOUNTER — Other Ambulatory Visit: Payer: Self-pay | Admitting: Internal Medicine

## 2022-06-23 DIAGNOSIS — Z1231 Encounter for screening mammogram for malignant neoplasm of breast: Secondary | ICD-10-CM

## 2022-07-08 DIAGNOSIS — H353112 Nonexudative age-related macular degeneration, right eye, intermediate dry stage: Secondary | ICD-10-CM | POA: Diagnosis not present

## 2022-07-08 DIAGNOSIS — H353221 Exudative age-related macular degeneration, left eye, with active choroidal neovascularization: Secondary | ICD-10-CM | POA: Diagnosis not present

## 2022-07-08 DIAGNOSIS — H35371 Puckering of macula, right eye: Secondary | ICD-10-CM | POA: Diagnosis not present

## 2022-07-08 DIAGNOSIS — H35423 Microcystoid degeneration of retina, bilateral: Secondary | ICD-10-CM | POA: Diagnosis not present

## 2022-07-09 DIAGNOSIS — M67431 Ganglion, right wrist: Secondary | ICD-10-CM | POA: Diagnosis not present

## 2022-07-10 ENCOUNTER — Telehealth: Payer: Self-pay | Admitting: Pharmacist

## 2022-07-10 NOTE — Telephone Encounter (Signed)
Patient called to inquire if she needed to get an annual mammogram done. Patient has one scheduled in August but wanted to know if it was necessary given her age and no known family history. Will route to PCP for further advice.

## 2022-07-11 NOTE — Telephone Encounter (Signed)
I think she could  stop screening    up to her

## 2022-07-14 NOTE — Telephone Encounter (Signed)
Called patient to let her know her PCP's response about mammogram but unable to reach. Left a voicemail asking for a call back.

## 2022-07-21 ENCOUNTER — Telehealth: Payer: Self-pay | Admitting: Pharmacist

## 2022-07-21 NOTE — Chronic Care Management (AMB) (Signed)
    Chronic Care Management Pharmacy Assistant   Name: [name]  MRN: [id number] DOB: 12/08/1941  07/22/2022 APPOINTMENT REMINDER  Christina Myers was reminded to have all medications, supplements and any blood glucose and blood pressure readings available for review with Jeni Salles, Pharm. D, at her telephone visit on 07/22/2022 at 1:00.  Care Gaps: AWV - scheduled 11/04/2022 Last BP - 120/74 on 01/08/2022 Tdap - overdue  Star Rating Drug: Simvastatin 20 mg - 06/25/2022 90 DS at [facility]  Any gaps in medications fill history? No  Gennie Alma [facility]  Catering manager [phone]

## 2022-07-21 NOTE — Telephone Encounter (Signed)
Patient called back to follow up on previous voicemail left for her. She is aware of Dr. Velora Mediate reponse and denies further questions at this time.

## 2022-07-22 ENCOUNTER — Ambulatory Visit (INDEPENDENT_AMBULATORY_CARE_PROVIDER_SITE_OTHER): Payer: Medicare Other | Admitting: Pharmacist

## 2022-07-22 DIAGNOSIS — E782 Mixed hyperlipidemia: Secondary | ICD-10-CM

## 2022-07-22 DIAGNOSIS — E039 Hypothyroidism, unspecified: Secondary | ICD-10-CM

## 2022-07-22 NOTE — Progress Notes (Signed)
Chronic Care Management Pharmacy Note  07/28/2022 Name:  Jacora Hopkins MRN:  [id number] DOB:  [birth year]  Summary: TSH is not at ideal target of 2.5-4.5 with concurrent osteopenia   Recommendations/Changes made from today's visit: -Recommend adjusting levothyroxine depending on TSH if within 2.5-4.5 target level -Requested refill for Synthroid to discount pharmacy -Recommend repeat DEXA scan   Plan: Follow up as needed   Subjective: Kenslee Achorn is an 81 y.o. year old female who is a primary patient of Panosh, Standley Brooking, MD.  The CCM team was consulted for assistance with disease management and care coordination needs.    Engaged with patient by telephone for follow up visit in response to provider referral for pharmacy case management and/or care coordination services.   Consent to Services:  The patient was given information about Chronic Care Management services, agreed to services, and gave verbal consent prior to initiation of services.  Please see initial visit note for detailed documentation.   Patient Care Team: Panosh, Standley Brooking, MD as PCP - Sandford Craze, MD (Dermatology) Richmond Campbell, MD (Gastroenterology) Elsie Saas, MD (Orthopedic Surgery) Sherlynn Stalls, MD as Consulting Physician (Ophthalmology) [place], [place], [place] (Optometry) [name], [facility] as Pharmacist (Pharmacist)  Recent office visits: 01/08/22 Shanon Ace, MD: Patient presented for annual exam.   Recent consult visits: 07/23/21 [facility] - Patient presented for a Mammogram   07/09/21 Key, Andree Moro NP - Patient presented for Dysuria and other concerns. No medication changes available.  02/25/21 Jody Bovard (OBGYN): Unable to access notes.  12/28/20 Jody Bovard (OBGYN): Unable to access notes.  Hospital visits: None in previous 6 months  Objective:  Lab Results  Component Value Date   CREATININE 0.82 01/01/2022   BUN 17 01/01/2022   GFR 66.54 01/01/2022   NA  141 01/01/2022   K 4.1 01/01/2022   CALCIUM 9.2 01/01/2022   CO2 30 01/01/2022   GLUCOSE 92 01/01/2022    Lab Results  Component Value Date/Time   GFR 66.54 01/01/2022 09:52 AM   GFR 65.10 01/04/2021 10:07 AM    Last diabetic Eye exam: No results found for: "HMDIABEYEEXA"  Last diabetic Foot exam: No results found for: "HMDIABFOOTEX"   Lab Results  Component Value Date   CHOL 169 01/01/2022   HDL 62.70 01/01/2022   LDLCALC 81 01/01/2022   LDLDIRECT 84.0 01/04/2021   TRIG 126.0 01/01/2022   CHOLHDL 3 01/01/2022       Latest Ref Rng & Units 01/01/2022    9:52 AM 01/04/2021   10:07 AM 12/28/2019    9:53 AM  Hepatic Function  Total Protein 6.0 - 8.3 g/dL 6.7  6.3  6.0   Albumin 3.5 - 5.2 g/dL 4.1  4.2  4.1   AST 0 - 37 U/L '15  14  16   ' ALT 0 - 35 U/L '15  14  13   ' Alk Phosphatase 39 - 117 U/L 90  107  82   Total Bilirubin 0.2 - 1.2 mg/dL 0.6  0.4  0.4   Bilirubin, Direct 0.0 - 0.3 mg/dL 0.1  0.1  0.1     Lab Results  Component Value Date/Time   TSH 0.68 01/01/2022 09:52 AM   TSH 1.25 01/04/2021 10:07 AM   FREET4 0.88 01/04/2021 10:07 AM   FREET4 1.07 12/28/2019 09:53 AM       Latest Ref Rng & Units 01/01/2022    9:52 AM 01/04/2021   10:07 AM 12/28/2019    9:53  AM  CBC  WBC 4.0 - 10.5 K/uL 7.4  7.6  5.4   Hemoglobin 12.0 - 15.0 g/dL 14.5  13.3  13.7   Hematocrit 36.0 - 46.0 % 44.1  40.2  40.3   Platelets 150.0 - 400.0 K/uL 290.0  309.0  260.0     Lab Results  Component Value Date/Time   VD25OH 45.52 01/01/2022 09:52 AM   VD25OH 24 01/31/2019 12:00 AM    Clinical ASCVD: No  The ASCVD Risk score (Arnett DK, et al., 2019) failed to calculate for the following reasons:   The 2019 ASCVD risk score is only valid for ages 48 to 54       01/08/2022   11:45 AM 10/23/2021    3:04 PM 01/09/2021    4:10 PM  Depression screen PHQ 2/9  Decreased Interest 0 0 0  Down, Depressed, Hopeless 0 0 0  PHQ - 2 Score 0 0 0  Altered sleeping 0    Tired, decreased energy 0     Change in appetite 0    Feeling bad or failure about yourself  0    Trouble concentrating 0    Moving slowly or fidgety/restless 0    Suicidal thoughts 0    PHQ-9 Score 0       Social History   Tobacco Use  Smoking Status Former  Smokeless Tobacco Never   BP Readings from Last 3 Encounters:  01/08/22 120/74  01/09/21 120/62  12/03/20 136/78   Pulse Readings from Last 3 Encounters:  01/08/22 62  01/09/21 71  12/03/20 70   Wt Readings from Last 3 Encounters:  01/08/22 108 lb 3.2 oz (49.1 kg)  12/03/20 129 lb (58.5 kg)  11/11/19 130 lb (59 kg)   BMI Readings from Last 3 Encounters:  01/08/22 20.78 kg/m  01/09/21 24.78 kg/m  12/03/20 23.59 kg/m    Assessment/Interventions: Review of patient past medical history, allergies, medications, health status, including review of consultants reports, laboratory and other test data, was performed as part of comprehensive evaluation and provision of chronic care management services.   SDOH:  (Social Determinants of Health) assessments and interventions performed: Yes (last 11/22)  SDOH Screenings   Alcohol Screen: Not on file  Depression (PHQ2-9): Low Risk  (01/08/2022)   Depression (PHQ2-9)    PHQ-2 Score: 0  Financial Resource Strain: Low Risk  (10/23/2021)   Overall Financial Resource Strain (CARDIA)    Difficulty of Paying Living Expenses: Not hard at all  Food Insecurity: No Food Insecurity (10/23/2021)   Hunger Vital Sign    Worried About Running Out of Food in the Last Year: Never true    Ran Out of Food in the Last Year: Never true  Housing: Low Risk  (10/17/2020)   Housing    Last Housing Risk Score: 0  Physical Activity: Insufficiently Active (10/23/2021)   Exercise Vital Sign    Days of Exercise per Week: 1 day    Minutes of Exercise per Session: 50 min  Social Connections: Moderately Integrated (10/23/2021)   Social Connection and Isolation Panel [NHANES]    Frequency of Communication with Friends and Family:  More than three times a week    Frequency of Social Gatherings with Friends and Family: More than three times a week    Attends Religious Services: More than 4 times per year    Active Member of [employer] or Organizations: No    Attends Archivist Meetings: Never    Marital Status: Married  Stress: No Stress Concern Present (10/23/2021)   [place]    Feeling of Stress : Only a little  Tobacco Use: Medium Risk (07/23/2022)   Patient History    Smoking Tobacco Use: Former    Smokeless Tobacco Use: Never    Passive Exposure: Not on file  Transportation Needs: No Transportation Needs (10/23/2021)   PRAPARE - Hydrologist (Medical): No    Lack of Transportation (Non-Medical): No    CCM Care Plan  Allergies  Allergen Reactions   Amoxicillin-Pot Clavulanate     REACTION: colitis  after ceclor  adn augmentin years ago   Cefdinir     REACTION: "colitis" diarrhea    Medications Reviewed Today     Reviewed by [name], [facility] (Pharmacist) on 07/22/22 at 1303  Med List Status: <None>   Medication Order Taking? Sig Documenting Provider Last Dose Status Informant  Cholecalciferol (VITAMIN D3) 2000 units TABS [id number] No Take 2,000 Units by mouth daily. [provider] Taking Active   Eluxadoline 75 MG TABS [id number] No Take by mouth as needed. [provider] Taking Active   estradiol (ESTRACE) 0.1 MG/GM vaginal cream [id number] No 1 -2 gram intravaginally 2  x per week Panosh, Standley Brooking, MD Taking Active   magnesium gluconate (MAGONATE) 500 MG tablet [id number]  Take 500 mg by mouth daily. [provider]  Active   Multiple Vitamins-Minerals (PRESERVISION AREDS 2 PO) [id number] No Take 1 tablet by mouth in the morning and at bedtime. [provider] Taking Active   simvastatin (ZOCOR) 20 MG tablet [id number]  TAKE 1 TABLET(20 MG) BY MOUTH AT BEDTIME Panosh,  Standley Brooking, MD  Active   SYNTHROID 100 MCG tablet [id number]  TAKE 1 TABLET(100 MCG) BY MOUTH DAILY Panosh, Standley Brooking, MD  Active   tobramycin (TOBREX) 0.3 % ophthalmic solution [id number] No  [provider] Taking Active            Med Note Zigmund Daniel Oct 17, 2020 12:22 PM) As needed            Patient Active Problem List   Diagnosis Date Noted   Ex-smoker 08/13/2018   Atrophic vaginitis 03/04/2018   Menopausal problem 03/04/2018   Osteoarthritis 03/04/2018   Postmenopausal bleeding 03/04/2018   AMD (age related macular degeneration) 11/26/2016   IBS (irritable bowel syndrome) 11/24/2015   Nocturnal foot cramps 10/28/2013   Postmenopausal HRT (hormone replacement therapy) 11/06/2011   Other reasons for seeking consultation 10/01/2011   SINUS BRADYCARDIA 04/30/2010   OSTEOARTHRITIS, HAND 04/24/2009   HYPERLIPIDEMIA 02/02/2008   HAND PAIN 02/02/2008   SKIN CANCER, HX OF 02/02/2008   Hypothyroidism 08/30/2007   COLONIC POLYPS, HX OF 08/30/2007    Immunization History  Administered Date(s) Administered   Fluad Quad(high Dose 65+) 11/01/2019   Influenza Whole 10/22/2011   Influenza, High Dose Seasonal PF 10/28/2013, 10/24/2014, 11/21/2015, 09/30/2016, 10/22/2017, 10/25/2020   Influenza-Unspecified 11/05/2018, 10/21/2021   PFIZER(Purple Top)SARS-COV-2 Vaccination 01/06/2020, 01/27/2020, 09/25/2020   Pfizer Covid-19 Vaccine Bivalent Booster 45yr & up 09/07/2021   Pneumococcal Conjugate-13 10/24/2014   Pneumococcal Polysaccharide-23 12/22/2008   Td 12/22/2008   Zoster Recombinat (Shingrix) 07/06/2018, 10/05/2018   Zoster, Live 06/11/2010   Patient just got COVID and had symptoms on the cruise. She had a lot of coughing and during the day was ok to do activities. She may have had a low grade fever. She did not  go to the ships doctor and was worried about being quarantined. Her husband got COVID last fall and she didn't get it. Her and her husband were  convinced she didn't have it on the ship and then she tested herself after she got off to find out she was positive.  Patient reports she left for her vacation and weighed 108 lbs and didn't pass up a dessert while on vacation. She only took the stairs on the ship and not an Media planner. Patient weighed 107.4 lbs when she came back home and was very pleased with this.  Conditions to be addressed/monitored:  Hyperlipidemia, Hypothyroidism, Depression, Osteopenia, Osteoarthritis and IBS, UTI prevention  Conditions addressed this visit: Hyperlipidemia, hypothyroidism  Care Plan : CCM Pharmacy Care Plan  Updates made by [name], [place] since 07/28/2022 12:00 AM     Problem: Problem: Hyperlipidemia, Depression, Osteopenia, Osteoarthritis and IBS, UTI prevention      Goal: Patient-Specific Goal   Start Date: 05/03/2021  Expected End Date: 05/03/2022  Recent Progress: On track  Priority: High  Note:   Current Barriers:  Unable to independently monitor therapeutic efficacy  Pharmacist Clinical Goal(s):  Patient will achieve adherence to monitoring guidelines and medication adherence to achieve therapeutic efficacy through collaboration with PharmD and provider.   Interventions: 1:1 collaboration with Panosh, Standley Brooking, MD regarding development and update of comprehensive plan of care as evidenced by provider attestation and co-signature Inter-disciplinary care team collaboration (see longitudinal plan of care) Comprehensive medication review performed; medication list updated in electronic medical record  Hyperlipidemia: (LDL goal < 100) -Controlled -Current treatment: Simvastatin 20 mg 1 tablet at bedtime - Appropriate, Effective, Safe, Accessible -Medications previously tried: none  -Current dietary patterns: patient eats leaner meats; gave up cookies and white bread and bagels and has lost about 14 lbs -Current exercise habits: teaches dance classes -Educated on Cholesterol goals;   Benefits of statin for ASCVD risk reduction; Importance of limiting foods high in cholesterol; Exercise goal of 150 minutes per week; -Counseled on diet and exercise extensively Recommended to continue current medication  Hypothyroidism (Goal: TSH 2.5-4.5 with osteopenia) -Controlled -Current treatment  Synthroid 100 mcg 1 tablet daily - Appropriate, Effective, Query Safe, Accessible -Medications previously tried: none  -Recommended adjusting dose of medication to higher TSH target with osteopenia.   Osteopenia (Goal prevent fractures) -Uncontrolled -Last DEXA Scan: 02/08/2019              T-Score femoral neck: R -1.7, L -1.2             T-Score total hip: n/a             T-Score lumbar spine: -0.8             T-Score forearm radius: n/a             10-year probability of major osteoporotic fracture: 14%             10-year probability of hip fracture: 3.7% -Patient is a candidate for pharmacologic treatment due to T-Score -1.0 to -2.5 and 10-year risk of hip fracture > 3% -Current treatment  Vitamin D 2000 units 1 tablet daily - Appropriate, Effective, Safe, Accessible -Medications previously tried: none  -Recommend [phone] units of vitamin D daily. Recommend 1200 mg of calcium daily from dietary and supplemental sources. Recommend weight-bearing and muscle strengthening exercises for building and maintaining bone density. -Recommended walking for weight bearing exercise  Osteoarthritis (Goal: minimize pain) -Not ideally controlled -Current treatment  Meloxicam  as needed (takes occassionally) - Appropriate, Effective, Safe, Accessible Tylenol as needed - Appropriate, Effective, Safe, Accessible -Medications previously tried: none  -Counseled on using Tylenol vs ibuprofen to reduce risk of bleeding with concurrent aspirin therapy  IBS (Goal: minimize symptoms) -Controlled -Current treatment  Viberzi 75 mg 1 tablet as needed - Appropriate, Effective, Safe,  Accessible -Medications previously tried: none  -Recommended to continue current medication  UTI prevention (Goal: minimize symptoms) -Controlled -Current treatment  Estradiol 0.1 mg/g vaginal cream as needed - Appropriate, Effective, Safe, Accessible -Medications previously tried: none  -Recommended to continue current medication   Health Maintenance -Vaccine gaps: tetanus -Current therapy:  Preservision twice daily -Educated on Cost vs benefit of each product must be carefully weighed by individual consumer -Patient is satisfied with current therapy and denies issues -Recommended to continue current medication  Patient Goals/Self-Care Activities Patient will:  - take medications as prescribed target a minimum of 150 minutes of moderate intensity exercise weekly  Follow Up Plan: The patient has been provided with contact information for the care management team and has been advised to call with any health related questions or concerns.          Medication Assistance: None required.  Patient affirms current coverage meets needs.  Compliance/Adherence/Medication fill history: Care Gaps: Tetanus   Star-Rating Drugs: Simvastatin (Zocor) 20 mg - Last filled 08/25/2021 90 DS at [facility]  Patient's preferred pharmacy is:  [facility] [place] - [address] [place] [address] Phone: [phone] Fax: [phone]  [facility] [place], [place] - [address] [place] [address] Phone: [phone] Fax: [phone]  Uses pill box? Yes - 7 day pill box Pt endorses 100% compliance  We discussed: Current pharmacy is preferred with insurance plan and patient is satisfied with pharmacy services Patient decided to: Continue current medication management strategy  Care Plan and Follow Up Patient Decision:  Patient agrees to Care Plan and Follow-up.  Plan: The  patient has been provided with contact information for the care management team and has been advised to call with any health related questions or concerns.   Jeni Salles, PharmD [facility] Clinical Pharmacist [place] [facility] [place]

## 2022-07-23 ENCOUNTER — Ambulatory Visit
Admission: RE | Admit: 2022-07-23 | Discharge: 2022-07-23 | Disposition: A | Discharge status: Home / Self Care | Payer: Medicare Other | Source: Ambulatory Visit | Attending: Internal Medicine | Admitting: Internal Medicine

## 2022-07-23 DIAGNOSIS — Z1231 Encounter for screening mammogram for malignant neoplasm of breast: Secondary | ICD-10-CM | POA: Diagnosis not present

## 2022-07-24 ENCOUNTER — Ambulatory Visit: Payer: Medicare Other

## 2022-07-24 DIAGNOSIS — H353221 Exudative age-related macular degeneration, left eye, with active choroidal neovascularization: Secondary | ICD-10-CM | POA: Diagnosis not present

## 2022-07-28 NOTE — Patient Instructions (Signed)
Hi Christina Myers,  It was great to talk to you again! Don't forget to call the pharmacy in [place] to set up your shipment of the Synthroid.  Please reach out to me if you have any questions or need anything!  Best, Maddie  Jeni Salles, PharmD, [place] [facility] [place]   Visit Information   Goals Addressed   None    Patient Care Plan: CCM Pharmacy Care Plan     Problem Identified: Problem: Hyperlipidemia, Depression, Osteopenia, Osteoarthritis and IBS, UTI prevention      Goal: Patient-Specific Goal   Start Date: 05/03/2021  Expected End Date: 05/03/2022  Recent Progress: On track  Priority: High  Note:   Current Barriers:  Unable to independently monitor therapeutic efficacy  Pharmacist Clinical Goal(s):  Patient will achieve adherence to monitoring guidelines and medication adherence to achieve therapeutic efficacy through collaboration with PharmD and provider.   Interventions: 1:1 collaboration with Panosh, Standley Brooking, MD regarding development and update of comprehensive plan of care as evidenced by provider attestation and co-signature Inter-disciplinary care team collaboration (see longitudinal plan of care) Comprehensive medication review performed; medication list updated in electronic medical record  Hyperlipidemia: (LDL goal < 100) -Controlled -Current treatment: Simvastatin 20 mg 1 tablet at bedtime - Appropriate, Effective, Safe, Accessible -Medications previously tried: none  -Current dietary patterns: patient eats leaner meats; gave up cookies and white bread and bagels and has lost about 14 lbs -Current exercise habits: teaches dance classes -Educated on Cholesterol goals;  Benefits of statin for ASCVD risk reduction; Importance of limiting foods high in cholesterol; Exercise goal of 150 minutes per week; -Counseled on diet and exercise extensively Recommended to continue current medication  Hypothyroidism  (Goal: TSH 2.5-4.5 with osteopenia) -Controlled -Current treatment  Synthroid 100 mcg 1 tablet daily - Appropriate, Effective, Query Safe, Accessible -Medications previously tried: none  -Recommended adjusting dose of medication to higher TSH target with osteopenia.   Osteopenia (Goal prevent fractures) -Uncontrolled -Last DEXA Scan: 02/08/2019              T-Score femoral neck: R -1.7, L -1.2             T-Score total hip: n/a             T-Score lumbar spine: -0.8             T-Score forearm radius: n/a             10-year probability of major osteoporotic fracture: 14%             10-year probability of hip fracture: 3.7% -Patient is a candidate for pharmacologic treatment due to T-Score -1.0 to -2.5 and 10-year risk of hip fracture > 3% -Current treatment  Vitamin D 2000 units 1 tablet daily - Appropriate, Effective, Safe, Accessible -Medications previously tried: none  -Recommend [phone] units of vitamin D daily. Recommend 1200 mg of calcium daily from dietary and supplemental sources. Recommend weight-bearing and muscle strengthening exercises for building and maintaining bone density. -Recommended walking for weight bearing exercise  Osteoarthritis (Goal: minimize pain) -Not ideally controlled -Current treatment  Meloxicam as needed (takes occassionally) - Appropriate, Effective, Safe, Accessible Tylenol as needed - Appropriate, Effective, Safe, Accessible -Medications previously tried: none  -Counseled on using Tylenol vs ibuprofen to reduce risk of bleeding with concurrent aspirin therapy  IBS (Goal: minimize symptoms) -Controlled -Current treatment  Viberzi 75 mg 1 tablet as needed - Appropriate, Effective, Safe, Accessible -Medications previously tried: none  -  Recommended to continue current medication  UTI prevention (Goal: minimize symptoms) -Controlled -Current treatment  Estradiol 0.1 mg/g vaginal cream as needed - Appropriate, Effective, Safe,  Accessible -Medications previously tried: none  -Recommended to continue current medication   Health Maintenance -Vaccine gaps: tetanus -Current therapy:  Preservision twice daily -Educated on Cost vs benefit of each product must be carefully weighed by individual consumer -Patient is satisfied with current therapy and denies issues -Recommended to continue current medication  Patient Goals/Self-Care Activities Patient will:  - take medications as prescribed target a minimum of 150 minutes of moderate intensity exercise weekly  Follow Up Plan: The patient has been provided with contact information for the care management team and has been advised to call with any health related questions or concerns.         Patient verbalizes understanding of instructions and care plan provided today and agrees to view in [place]. Active MyChart status and patient understanding of how to access instructions and care plan via MyChart confirmed with patient.    The patient has been provided with contact information for the care management team and has been advised to call with any health related questions or concerns.   [name], [facility]

## 2022-07-30 ENCOUNTER — Telehealth: Payer: Self-pay | Admitting: *Deleted

## 2022-07-30 MED ORDER — SYNTHROID 100 MCG PO TABS: 100.0000 ug | ORAL_TABLET | Freq: Every day | ORAL | 1 refills | Status: DC

## 2022-07-30 NOTE — Telephone Encounter (Signed)
-----   Message from [name], [facility] sent at 07/28/2022  9:07 AM EDT ----- Regarding: Synthroid refill Hi,  Can you please send a refill of her Synthroid to [facility] in [place], [place]? This is a discounted pharmacy so it will be a lot less expensive to get it from here. Please send it for a 90 days supply per request from patient.  Thank you! Maddie

## 2022-07-30 NOTE — Telephone Encounter (Signed)
Refill sent.

## 2022-08-14 DIAGNOSIS — Z85828 Personal history of other malignant neoplasm of skin: Secondary | ICD-10-CM | POA: Diagnosis not present

## 2022-08-14 DIAGNOSIS — L280 Lichen simplex chronicus: Secondary | ICD-10-CM | POA: Diagnosis not present

## 2022-08-14 DIAGNOSIS — L570 Actinic keratosis: Secondary | ICD-10-CM | POA: Diagnosis not present

## 2022-08-14 DIAGNOSIS — L821 Other seborrheic keratosis: Secondary | ICD-10-CM | POA: Diagnosis not present

## 2022-08-21 DIAGNOSIS — E039 Hypothyroidism, unspecified: Secondary | ICD-10-CM | POA: Diagnosis not present

## 2022-08-21 DIAGNOSIS — E785 Hyperlipidemia, unspecified: Secondary | ICD-10-CM | POA: Diagnosis not present

## 2022-08-21 DIAGNOSIS — I10 Essential (primary) hypertension: Secondary | ICD-10-CM

## 2022-08-21 DIAGNOSIS — M1990 Unspecified osteoarthritis, unspecified site: Secondary | ICD-10-CM | POA: Diagnosis not present

## 2022-08-21 DIAGNOSIS — M8580 Other specified disorders of bone density and structure, unspecified site: Secondary | ICD-10-CM

## 2022-09-23 ENCOUNTER — Telehealth: Payer: Self-pay | Admitting: Pharmacist

## 2022-09-23 NOTE — Telephone Encounter (Signed)
Called patient to follow up on Synthroid prescription to make sure she has been getting it from the discount pharmacy. Patient was supposed to let me know after the CCM visit when she got her first shipment. Left a message requesting a call back. Will try again later if she doesn't call back.

## 2022-09-23 NOTE — Telephone Encounter (Signed)
Patient called back and confirmed that she got her first shipment a couple of weeks ago from [facility] and it was cheaper than what she was paying at her usual pharmacy.

## 2022-10-01 DIAGNOSIS — H43813 Vitreous degeneration, bilateral: Secondary | ICD-10-CM | POA: Diagnosis not present

## 2022-10-01 DIAGNOSIS — H353221 Exudative age-related macular degeneration, left eye, with active choroidal neovascularization: Secondary | ICD-10-CM | POA: Diagnosis not present

## 2022-10-01 DIAGNOSIS — H353112 Nonexudative age-related macular degeneration, right eye, intermediate dry stage: Secondary | ICD-10-CM | POA: Diagnosis not present

## 2022-10-01 DIAGNOSIS — H35371 Puckering of macula, right eye: Secondary | ICD-10-CM | POA: Diagnosis not present

## 2022-10-28 ENCOUNTER — Ambulatory Visit (INDEPENDENT_AMBULATORY_CARE_PROVIDER_SITE_OTHER): Payer: Medicare Other

## 2022-10-28 VITALS — Ht 60.5 in | Wt 108.0 lb

## 2022-10-28 DIAGNOSIS — Z0000 Encounter for general adult medical examination without abnormal findings: Secondary | ICD-10-CM | POA: Diagnosis not present

## 2022-10-28 NOTE — Progress Notes (Signed)
Subjective:   Christina Myers is a 81 y.o. female who presents for Medicare Annual (Subsequent) preventive examination.  Review of Systems    Virtual Visit via Telephone Note  I connected with  Christina Myers on 10/28/22 at  3:30 PM EST by telephone and verified that I am speaking with the correct person using two identifiers.  Location: Patient: Home Provider: Office Persons participating in the virtual visit: patient/Nurse Health Advisor   I discussed the limitations, risks, security and privacy concerns of performing an evaluation and management service by telephone and the availability of in person appointments. The patient expressed understanding and agreed to proceed.  Interactive audio and video telecommunications were attempted between this nurse and patient, however failed, due to patient having technical difficulties OR patient did not have access to video capability.  We continued and completed visit with audio only.  Some vital signs may be absent or patient reported.   Criselda Peaches, LPN  Cardiac Risk Factors include: advanced age (>17mn, >>48women)     Objective:    Today's Vitals   10/28/22 1541  Weight: 108 lb (49 kg)  Height: 5' 0.5" (1.537 m)   Body mass index is 20.75 kg/m.     10/28/2022    3:50 PM 10/23/2021    3:06 PM 10/17/2020   12:27 PM  Advanced Directives  Does Patient Have a Medical Advance Directive? Yes Yes Yes  Type of AParamedicof [place] will Healthcare Power of Attorney Living will  Copy of [place] Chart? No - copy requested No - copy requested     Current Medications (verified) Outpatient Encounter Medications as of 10/28/2022  Medication Sig   Cholecalciferol (VITAMIN D3) 2000 units TABS Take 2,000 Units by mouth daily.   Eluxadoline 75 MG TABS Take by mouth as needed.   estradiol (ESTRACE) 0.1 MG/GM vaginal cream 1 -2 gram intravaginally 2  x per week   magnesium  gluconate (MAGONATE) 500 MG tablet Take 500 mg by mouth daily.   Multiple Vitamins-Minerals (PRESERVISION AREDS 2 PO) Take 1 tablet by mouth in the morning and at bedtime.   simvastatin (ZOCOR) 20 MG tablet TAKE 1 TABLET(20 MG) BY MOUTH AT BEDTIME   SYNTHROID 100 MCG tablet Take 1 tablet (100 mcg total) by mouth daily before breakfast.   tobramycin (TOBREX) 0.3 % ophthalmic solution    No facility-administered encounter medications on file as of 10/28/2022.    Allergies (verified) Amoxicillin-pot clavulanate and Cefdinir   History: Past Medical History:  Diagnosis Date   COLONIC POLYPS, HX OF 08/30/2007   Qualifier: Diagnosis of  By: CRogue BussingCMA, Jacqualynn     Hx of colonic polyps    Hypertension    Hypothyroidism    OSTEOARTHRITIS, HAND 04/24/2009   Qualifier: Diagnosis of  By: PRegis BillMD, WStandley Brooking   PVC (premature ventricular contraction)    [facility] evaluation 7/03   Seasonal allergies    Skin cancer    hx of bcca of face   SKIN CANCER, HX OF 02/02/2008   Qualifier: Diagnosis of  By: PRegis BillMD, WStandley Brooking   Past Surgical History:  Procedure Laterality Date   FACIAL COSMETIC SURGERY     TONSILLECTOMY     tubal lig     Family History  Problem Relation Age of Onset   Cancer Other        colon, prostate    Diabetes Other    Stroke Other  Heart disease Other    Colon cancer Mother    Social History   Socioeconomic History   Marital status: Married    Spouse name: Not on file   Number of children: Not on file   Years of education: Not on file   Highest education level: Not on file  Occupational History   Not on file  Tobacco Use   Smoking status: Former   Smokeless tobacco: Never  Substance and Sexual Activity   Alcohol use: Yes    Comment: rarely   Drug use: No   Sexual activity: Not on file  Other Topics Concern   Not on file  Social History Narrative   Married    Former smoker   Set designer and teaches tap dancing.    No etoh   HH of 2    Social  Determinants of Health   Financial Resource Strain: Low Risk  (10/28/2022)   Overall Financial Resource Strain (CARDIA)    Difficulty of Paying Living Expenses: Not hard at all  Food Insecurity: No Food Insecurity (10/28/2022)   Hunger Vital Sign    Worried About Running Out of Food in the Last Year: Never true    Ran Out of Food in the Last Year: Never true  Transportation Needs: No Transportation Needs (10/28/2022)   PRAPARE - Hydrologist (Medical): No    Lack of Transportation (Non-Medical): No  Physical Activity: Insufficiently Active (10/28/2022)   Exercise Vital Sign    Days of Exercise per Week: 1 day    Minutes of Exercise per Session: 60 min  Stress: No Stress Concern Present (10/28/2022)   [place]    Feeling of Stress : Not at all  Social Connections: [place] (10/28/2022)   Social Connection and Isolation Panel [NHANES]    Frequency of Communication with Friends and Family: More than three times a week    Frequency of Social Gatherings with Friends and Family: More than three times a week    Attends Religious Services: More than 4 times per year    Active Member of [employer] or Organizations: Yes    Attends Music therapist: More than 4 times per year    Marital Status: Married    Tobacco Counseling Counseling given: Not Answered   Clinical Intake:  Pre-visit preparation completed: No  Pain : No/denies pain     BMI - recorded: 20.75 Nutritional Status: BMI of 19-24  Normal Nutritional Risks: None Diabetes: No  How often do you need to have someone help you when you read instructions, pamphlets, or other written materials from your doctor or pharmacy?: 1 - Never  Diabetic?  No  Interpreter Needed?: No  Information entered by :: Rolene Arbour LPN   Activities of Daily Living    10/28/2022    3:48 PM  In your present state of health, do  you have any difficulty performing the following activities:  Hearing? 0  Vision? 0  Difficulty concentrating or making decisions? 0  Walking or climbing stairs? 0  Dressing or bathing? 0  Doing errands, shopping? 0  Preparing Food and eating ? N  Using the Toilet? N  In the past six months, have you accidently leaked urine? N  Do you have problems with loss of bowel control? N  Managing your Medications? N  Managing your Finances? N  Housekeeping or managing your Housekeeping? N    Patient Care Team:  Panosh, Standley Brooking, MD as PCP - Sandford Craze, MD (Dermatology) Richmond Campbell, MD (Gastroenterology) Elsie Saas, MD (Orthopedic Surgery) Sherlynn Stalls, MD as Consulting Physician (Ophthalmology) [place], Heather, [place] (Optometry) [name], [facility] as Pharmacist (Pharmacist)  Indicate any recent Medical Services you may have received from other than [facility] providers in the past year (date may be approximate).     Assessment:   This is a routine wellness examination for [facility].  Hearing/Vision screen Hearing Screening - Comments:: Denies hearing difficulties   Vision Screening - Comments:: Wears rx glasses - up to date with routine eye exams with  Dr [place]  Dietary issues and exercise activities discussed: Exercise limited by: None identified   Goals Addressed               This Visit's Progress     No current goals (pt-stated)        I just would like to stay alive and healthy.       Depression Screen    10/28/2022    3:47 PM 01/08/2022   11:45 AM 10/23/2021    3:04 PM 01/09/2021    4:10 PM 10/17/2020   12:26 PM 01/04/2020    3:04 PM 12/29/2018    2:24 PM  PHQ 2/9 Scores  PHQ - 2 Score 0 0 0 0 0 0 0  PHQ- 9 Score  0         Fall Risk    10/28/2022    3:49 PM 01/08/2022   11:45 AM 10/23/2021    3:07 PM 01/09/2021    4:10 PM 10/17/2020   12:29 PM  [place] in the past year? 0 0 1 1 0  Number falls in past yr: 0  1 0 0  Injury with Fall? 0  1  0 0  Comment   back    Risk for fall due to : No Fall Risks      Follow up Falls prevention discussed  Falls prevention discussed  Falls prevention discussed    FALL RISK PREVENTION PERTAINING TO THE HOME:  Any stairs in or around the home? Yes  If so, are there any without handrails? No  Home free of loose throw rugs in walkways, pet beds, electrical cords, etc? Yes  Adequate lighting in your home to reduce risk of falls? Yes   ASSISTIVE DEVICES UTILIZED TO PREVENT FALLS:  Life alert? No  Use of a cane, walker or w/c? No  Grab bars in the bathroom? Yes  Shower chair or bench in shower? No  Elevated toilet seat or a handicapped toilet? Yes  TIMED UP AND GO:  Was the test performed? No .Audio Visit   Cognitive Function:        10/28/2022    3:50 PM 10/23/2021    3:08 PM 10/17/2020   12:32 PM  6CIT Screen  What Year? 0 points 0 points 0 points  What month? 0 points 0 points 0 points  What time? 0 points 0 points   Count back from 20 0 points 0 points 0 points  Months in reverse 0 points 0 points 0 points  Repeat phrase 0 points 0 points 0 points  Total Score 0 points 0 points     Immunizations Immunization History  Administered Date(s) Administered   Fluad Quad(high Dose 65+) 11/01/2019, 09/15/2022   Influenza Whole 10/22/2011   Influenza, High Dose Seasonal PF 10/28/2013, 10/24/2014, 11/21/2015, 09/30/2016, 10/22/2017, 10/25/2020   Influenza-Unspecified 11/05/2018, 10/21/2021  PFIZER(Purple Top)SARS-COV-2 Vaccination 01/06/2020, 01/27/2020, 09/25/2020   Pfizer Covid-19 Vaccine Bivalent Booster 13yr & up 09/07/2021   Pneumococcal Conjugate-13 10/24/2014   Pneumococcal Polysaccharide-23 12/22/2008   Td 12/22/2008   Zoster Recombinat (Shingrix) 07/06/2018, 10/05/2018   Zoster, Live 06/11/2010    TDAP status: Due, Education has been provided regarding the importance of this vaccine. Advised may receive this vaccine at local pharmacy or Health Dept. Aware to  provide a copy of the vaccination record if obtained from local pharmacy or Health Dept. Verbalized acceptance and understanding.  Flu Vaccine status: Up to date  Pneumococcal vaccine status: Up to date  Covid-19 vaccine status: Completed vaccines  Qualifies for Shingles Vaccine? Yes   Zostavax completed Yes   Shingrix Completed?: Yes  Screening Tests Health Maintenance  Topic Date Due   COVID-19 Vaccine (5 - Pfizer risk series) 11/13/2022 (Originally 11/02/2021)   TETANUS/TDAP  10/29/2023 (Originally 12/22/2018)   Medicare Annual Wellness (AWV)  10/29/2023   Pneumonia Vaccine 81 Years old  Completed   INFLUENZA VACCINE  Completed   DEXA SCAN  Completed   Zoster Vaccines- Shingrix  Completed   HPV VACCINES  Aged Out    Health Maintenance  There are no preventive care reminders to display for this patient.   Colorectal cancer screening: No longer required.   Mammogram status: No longer required due to age.  Bone Density status: Completed 02/08/18. Results reflect: Bone density results: OSTEOPOROSIS. Repeat every   years.  Lung Cancer Screening: (Low Dose CT Chest recommended if Age 83-80years, 30 pack-year currently smoking OR have quit w/in 15years.) does not qualify.     Additional Screening:  Hepatitis C Screening: does not qualify; Completed   Vision Screening: Recommended annual ophthalmology exams for early detection of glaucoma and other disorders of the eye. Is the patient up to date with their annual eye exam?  Yes  Who is the provider or what is the name of the office in which the patient attends annual eye exams? Dr OSy[place] RepublicIf pt is not established with a provider, would they like to be referred to a provider to establish care? No .   Dental Screening: Recommended annual dental exams for proper oral hygiene  Community Resource Referral / Chronic Care Management:  CRR required this visit?  No   CCM required this visit?  No      Plan:     I have  personally reviewed and noted the following in the patient's chart:   Medical and social history Use of alcohol, tobacco or illicit drugs  Current medications and supplements including opioid prescriptions. Patient is not currently taking opioid prescriptions. Functional ability and status Nutritional status Physical activity Advanced directives List of other physicians Hospitalizations, surgeries, and ER visits in previous 12 months Vitals Screenings to include cognitive, depression, and falls Referrals and appointments  In addition, I have reviewed and discussed with patient certain preventive protocols, quality metrics, and best practice recommendations. A written personalized care plan for preventive services as well as general preventive health recommendations were provided to patient.     BCriselda Peaches LPN   [id number]/[id number]  Nurse Notes: None

## 2022-10-28 NOTE — Patient Instructions (Addendum)
Christina Myers , Thank you for taking time to come for your Medicare Wellness Visit. I appreciate your ongoing commitment to your health goals. Please review the following plan we discussed and let me know if I can assist you in the future.   These are the goals we discussed:  Goals       No current goals (pt-stated)      I just would like to stay alive and healthy.      Patient Stated      More exercise walking      Patient Stated      Maintain weight at this state 107 lbs and lower cholesterol         This is a list of the screening recommended for you and due dates:  Health Maintenance  Topic Date Due   COVID-19 Vaccine (5 - Pfizer risk series) 11/13/2022*   Tetanus Vaccine  10/29/2023*   Medicare Annual Wellness Visit  10/29/2023   Pneumonia Vaccine  Completed   Flu Shot  Completed   DEXA scan (bone density measurement)  Completed   Zoster (Shingles) Vaccine  Completed   HPV Vaccine  Aged Out  *Topic was postponed. The date shown is not the original due date.    Advanced directives: Please bring a copy of your health care power of attorney and living will to the office to be added to your chart at your convenience.   Conditions/risks identified: None  Next appointment: Follow up in one year for your annual wellness visit    Preventive Care 65 Years and Older, Female Preventive care refers to lifestyle choices and visits with your health care provider that can promote health and wellness. What does preventive care include? A yearly physical exam. This is also called an annual well check. Dental exams once or twice a year. Routine eye exams. Ask your health care provider how often you should have your eyes checked. Personal lifestyle choices, including: Daily care of your teeth and gums. Regular physical activity. Eating a healthy diet. Avoiding tobacco and drug use. Limiting alcohol use. Practicing safe sex. Taking low-dose aspirin every day. Taking vitamin and  mineral supplements as recommended by your health care provider. What happens during an annual well check? The services and screenings done by your health care provider during your annual well check will depend on your age, overall health, lifestyle risk factors, and family history of disease. Counseling  Your health care provider may ask you questions about your: Alcohol use. Tobacco use. Drug use. Emotional well-being. Home and relationship well-being. Sexual activity. Eating habits. History of falls. Memory and ability to understand (cognition). Work and work Statistician. Reproductive health. Screening  You may have the following tests or measurements: Height, weight, and BMI. Blood pressure. Lipid and cholesterol levels. These may be checked every 5 years, or more frequently if you are over 77 years old. Skin check. Lung cancer screening. You may have this screening every year starting at age 1 if you have a 30-pack-year history of smoking and currently smoke or have quit within the past 15 years. Fecal occult blood test (FOBT) of the stool. You may have this test every year starting at age 46. Flexible sigmoidoscopy or colonoscopy. You may have a sigmoidoscopy every 5 years or a colonoscopy every 10 years starting at age 28. Hepatitis C blood test. Hepatitis B blood test. Sexually transmitted disease (STD) testing. Diabetes screening. This is done by checking your blood sugar (glucose) after you have not  eaten for a while (fasting). You may have this done every 1-3 years. Bone density scan. This is done to screen for osteoporosis. You may have this done starting at age 69. Mammogram. This may be done every 1-2 years. Talk to your health care provider about how often you should have regular mammograms. Talk with your health care provider about your test results, treatment options, and if necessary, the need for more tests. Vaccines  Your health care provider may recommend  certain vaccines, such as: Influenza vaccine. This is recommended every year. Tetanus, diphtheria, and acellular pertussis (Tdap, Td) vaccine. You may need a Td booster every 10 years. Zoster vaccine. You may need this after age 85. Pneumococcal 13-valent conjugate (PCV13) vaccine. One dose is recommended after age 68. Pneumococcal polysaccharide (PPSV23) vaccine. One dose is recommended after age 71. Talk to your health care provider about which screenings and vaccines you need and how often you need them. This information is not intended to replace advice given to you by your health care provider. Make sure you discuss any questions you have with your health care provider. Document Released: 01/04/2016 Document Revised: 08/27/2016 Document Reviewed: 10/09/2015 Elsevier Interactive Patient Education  2017 [place] Prevention in the Home Falls can cause injuries. They can happen to people of all ages. There are many things you can do to make your home safe and to help prevent falls. What can I do on the outside of my home? Regularly fix the edges of walkways and driveways and fix any cracks. Remove anything that might make you trip as you walk through a door, such as a raised step or threshold. Trim any bushes or trees on the path to your home. Use bright outdoor lighting. Clear any walking paths of anything that might make someone trip, such as rocks or tools. Regularly check to see if handrails are loose or broken. Make sure that both sides of any steps have handrails. Any raised decks and porches should have guardrails on the edges. Have any leaves, snow, or ice cleared regularly. Use sand or salt on walking paths during winter. Clean up any spills in your garage right away. This includes oil or grease spills. What can I do in the bathroom? Use night lights. Install grab bars by the toilet and in the tub and shower. Do not use towel bars as grab bars. Use non-skid mats or  decals in the tub or shower. If you need to sit down in the shower, use a plastic, non-slip stool. Keep the floor dry. Clean up any water that spills on the floor as soon as it happens. Remove soap buildup in the tub or shower regularly. Attach bath mats securely with double-sided non-slip rug tape. Do not have throw rugs and other things on the floor that can make you trip. What can I do in the bedroom? Use night lights. Make sure that you have a light by your bed that is easy to reach. Do not use any sheets or blankets that are too big for your bed. They should not hang down onto the floor. Have a firm chair that has side arms. You can use this for support while you get dressed. Do not have throw rugs and other things on the floor that can make you trip. What can I do in the kitchen? Clean up any spills right away. Avoid walking on wet floors. Keep items that you use a lot in easy-to-reach places. If you need to reach  something above you, use a strong step stool that has a grab bar. Keep electrical cords out of the way. Do not use floor polish or wax that makes floors slippery. If you must use wax, use non-skid floor wax. Do not have throw rugs and other things on the floor that can make you trip. What can I do with my stairs? Do not leave any items on the stairs. Make sure that there are handrails on both sides of the stairs and use them. Fix handrails that are broken or loose. Make sure that handrails are as long as the stairways. Check any carpeting to make sure that it is firmly attached to the stairs. Fix any carpet that is loose or worn. Avoid having throw rugs at the top or bottom of the stairs. If you do have throw rugs, attach them to the floor with carpet tape. Make sure that you have a light switch at the top of the stairs and the bottom of the stairs. If you do not have them, ask someone to add them for you. What else can I do to help prevent falls? Wear shoes that: Do not  have high heels. Have rubber bottoms. Are comfortable and fit you well. Are closed at the toe. Do not wear sandals. If you use a stepladder: Make sure that it is fully opened. Do not climb a closed stepladder. Make sure that both sides of the stepladder are locked into place. Ask someone to hold it for you, if possible. Clearly mark and make sure that you can see: Any grab bars or handrails. First and last steps. Where the edge of each step is. Use tools that help you move around (mobility aids) if they are needed. These include: Canes. Walkers. Scooters. Crutches. Turn on the lights when you go into a dark area. Replace any light bulbs as soon as they burn out. Set up your furniture so you have a clear path. Avoid moving your furniture around. If any of your floors are uneven, fix them. If there are any pets around you, be aware of where they are. Review your medicines with your doctor. Some medicines can make you feel dizzy. This can increase your chance of falling. Ask your doctor what other things that you can do to help prevent falls. This information is not intended to replace advice given to you by your health care provider. Make sure you discuss any questions you have with your health care provider. Document Released: 10/04/2009 Document Revised: 05/15/2016 Document Reviewed: 01/12/2015 Elsevier Interactive Patient Education  2017 [employer].

## 2022-10-29 DIAGNOSIS — M25511 Pain in right shoulder: Secondary | ICD-10-CM | POA: Diagnosis not present

## 2022-11-19 DIAGNOSIS — S46011A Strain of muscle(s) and tendon(s) of the rotator cuff of right shoulder, initial encounter: Secondary | ICD-10-CM | POA: Diagnosis not present

## 2022-11-26 DIAGNOSIS — M75122 Complete rotator cuff tear or rupture of left shoulder, not specified as traumatic: Secondary | ICD-10-CM | POA: Diagnosis not present

## 2022-12-17 DIAGNOSIS — M75122 Complete rotator cuff tear or rupture of left shoulder, not specified as traumatic: Secondary | ICD-10-CM | POA: Diagnosis not present

## 2022-12-19 ENCOUNTER — Telehealth: Payer: Self-pay | Admitting: Internal Medicine

## 2022-12-19 ENCOUNTER — Other Ambulatory Visit: Payer: Self-pay

## 2022-12-19 DIAGNOSIS — E039 Hypothyroidism, unspecified: Secondary | ICD-10-CM

## 2022-12-19 DIAGNOSIS — E782 Mixed hyperlipidemia: Secondary | ICD-10-CM

## 2022-12-19 NOTE — Telephone Encounter (Signed)
Pt has cpe sch for 01-27-2023 and would like to have lab in advance. Please advise

## 2022-12-19 NOTE — Telephone Encounter (Signed)
Spoke with patient, lab appointment scheduled for 01/19/23.  Patient aware to fast. Lab orders placed.

## 2022-12-24 DIAGNOSIS — H43813 Vitreous degeneration, bilateral: Secondary | ICD-10-CM | POA: Diagnosis not present

## 2022-12-24 DIAGNOSIS — H35371 Puckering of macula, right eye: Secondary | ICD-10-CM | POA: Diagnosis not present

## 2022-12-24 DIAGNOSIS — H43821 Vitreomacular adhesion, right eye: Secondary | ICD-10-CM | POA: Diagnosis not present

## 2022-12-24 DIAGNOSIS — H353221 Exudative age-related macular degeneration, left eye, with active choroidal neovascularization: Secondary | ICD-10-CM | POA: Diagnosis not present

## 2022-12-24 DIAGNOSIS — H353112 Nonexudative age-related macular degeneration, right eye, intermediate dry stage: Secondary | ICD-10-CM | POA: Diagnosis not present

## 2022-12-29 DIAGNOSIS — M75122 Complete rotator cuff tear or rupture of left shoulder, not specified as traumatic: Secondary | ICD-10-CM | POA: Diagnosis not present

## 2023-01-01 DIAGNOSIS — M75122 Complete rotator cuff tear or rupture of left shoulder, not specified as traumatic: Secondary | ICD-10-CM | POA: Diagnosis not present

## 2023-01-07 DIAGNOSIS — M75122 Complete rotator cuff tear or rupture of left shoulder, not specified as traumatic: Secondary | ICD-10-CM | POA: Diagnosis not present

## 2023-01-12 DIAGNOSIS — M75122 Complete rotator cuff tear or rupture of left shoulder, not specified as traumatic: Secondary | ICD-10-CM | POA: Diagnosis not present

## 2023-01-15 DIAGNOSIS — M75122 Complete rotator cuff tear or rupture of left shoulder, not specified as traumatic: Secondary | ICD-10-CM | POA: Diagnosis not present

## 2023-01-16 ENCOUNTER — Other Ambulatory Visit: Payer: Self-pay | Admitting: Internal Medicine

## 2023-01-16 DIAGNOSIS — E782 Mixed hyperlipidemia: Secondary | ICD-10-CM

## 2023-01-19 ENCOUNTER — Telehealth: Payer: Self-pay | Admitting: Internal Medicine

## 2023-01-19 ENCOUNTER — Other Ambulatory Visit: Payer: Medicare Other

## 2023-01-19 DIAGNOSIS — E039 Hypothyroidism, unspecified: Secondary | ICD-10-CM

## 2023-01-19 DIAGNOSIS — M75122 Complete rotator cuff tear or rupture of left shoulder, not specified as traumatic: Secondary | ICD-10-CM | POA: Diagnosis not present

## 2023-01-19 DIAGNOSIS — E782 Mixed hyperlipidemia: Secondary | ICD-10-CM

## 2023-01-19 LAB — HEPATIC FUNCTION PANEL
ALT: 15 U/L (ref 0–35)
AST: 17 U/L (ref 0–37)
Albumin: 4.3 g/dL (ref 3.5–5.2)
Alkaline Phosphatase: 65 U/L (ref 39–117)
Bilirubin, Direct: 0.1 mg/dL (ref 0.0–0.3)
Total Bilirubin: 0.4 mg/dL (ref 0.2–1.2)
Total Protein: 7 g/dL (ref 6.0–8.3)

## 2023-01-19 LAB — BASIC METABOLIC PANEL
BUN: 20 mg/dL (ref 6–23)
CO2: 27 mEq/L (ref 19–32)
Calcium: 9.2 mg/dL (ref 8.4–10.5)
Chloride: 104 mEq/L (ref 96–112)
Creatinine, Ser: 0.89 mg/dL (ref 0.40–1.20)
GFR: 59.87 mL/min — ABNORMAL LOW (ref >60.00)
Glucose, Bld: 92 mg/dL (ref 70–99)
Potassium: 4.6 mEq/L (ref 3.5–5.1)
Sodium: 140 mEq/L (ref 135–145)

## 2023-01-19 LAB — LIPID PANEL
Cholesterol: 168 mg/dL (ref 0–200)
HDL: 58.9 mg/dL (ref >39.00)
LDL Cholesterol: 80 mg/dL (ref 0–99)
NonHDL: 109.26
Total CHOL/HDL Ratio: 3
Triglycerides: 144 mg/dL (ref 0.0–149.0)
VLDL: 28.8 mg/dL (ref 0.0–40.0)

## 2023-01-19 LAB — TSH: TSH: 0.16 u[IU]/mL — ABNORMAL LOW (ref 0.35–5.50)

## 2023-01-19 LAB — VITAMIN D 25 HYDROXY (VIT D DEFICIENCY, FRACTURES): VITD: 47.19 ng/mL (ref 30.00–100.00)

## 2023-01-19 NOTE — Addendum Note (Signed)
Addended by: Rosalyn Gess D on: 01/19/2023 09:57 AM   Modules accepted: Orders

## 2023-01-19 NOTE — Telephone Encounter (Signed)
As long as her insurance will cover it in the office, it is ok to get it at her [facility]

## 2023-01-19 NOTE — Telephone Encounter (Signed)
Pt wanted to see if she can get her tetanus shot during her phys appt on 2/6 with Dr. Abbott Pao would like a call back to confirm if that is okay or is she needs to go somewhere else.   Please advise.

## 2023-01-19 NOTE — Telephone Encounter (Signed)
Thank you; LVM to let pt know.

## 2023-01-22 DIAGNOSIS — M75122 Complete rotator cuff tear or rupture of left shoulder, not specified as traumatic: Secondary | ICD-10-CM | POA: Diagnosis not present

## 2023-01-26 NOTE — Progress Notes (Signed)
Chief Complaint  Patient presents with   Annual Exam    HPI: Patient  Christina Myers  82 y.o. comes in today for Preventive Health Care visit   And med eval Thyroid:  doing well  taking 141mg daily for a long time HLD  simvastatin no concerns  IBS controlled   hx of fam hx colonoscopy Dr mEarlean Shawlsince retired ? If should still be followed for risk   no sx  DJD knee  problem at times   active    Has lost weight  from 130 range   and pleased with this   diet changes   concern if has to change thyroid dose .  Left eye wet MD  stable  and following by opthalm  Health Maintenance  Topic Date Due   DTaP/Tdap/Td (2 - Tdap) 12/22/2018   COVID-19 Vaccine (5 - 2023-24 season) 08/22/2022   Medicare Annual Wellness (AWV)  10/29/2023   Pneumonia Vaccine 82 Years old  Completed   INFLUENZA VACCINE  Completed   DEXA SCAN  Completed   Zoster Vaccines- Shingrix  Completed   HPV VACCINES  Aged Out   Health Maintenance Review LIFESTYLE:  Exercise:  dances  still Tobacco/ETS:n Alcohol: not much Sugar beverages: n Sleep: ok  Drug use: no HH of 2      ROS:  GEN/ HEENT: No fever, s sweats headaches vision problems hearing changes, CV/ PULM; No chest pain shortness of breath cough, syncope,edema  change in exercise tolerance. GI /GU: No adominal pain, vomiting, change in bowel habits. No blood in the stool. No significant GU symptoms. SKIN/HEME: ,no acute skin rashes suspicious lesions or bleeding. No lymphadenopathy, nodules, masses.  NEURO/ PSYCH:  No neurologic signs such as weakness numbness. No depression anxiety. IMM/ Allergy: No unusual infections.  Allergy .   REST of 12 system review negative except as per HPI   Past Medical History:  Diagnosis Date   COLONIC POLYPS, HX OF 08/30/2007   Qualifier: Diagnosis of  By: CRogue BussingCMA, Jacqualynn     Hx of colonic polyps    Hypertension    Hypothyroidism    OSTEOARTHRITIS, HAND 04/24/2009   Qualifier: Diagnosis of  By: PRegis Bill MD, WStandley Brooking   PVC (premature ventricular contraction)    [facility] evaluation 7/03   Seasonal allergies    Skin cancer    hx of bcca of face   SKIN CANCER, HX OF 02/02/2008   Qualifier: Diagnosis of  By: PRegis BillMD, WStandley Brooking    Past Surgical History:  Procedure Laterality Date   FACIAL COSMETIC SURGERY     TONSILLECTOMY     tubal lig      Family History  Problem Relation Age of Onset   Cancer Other        colon, prostate    Diabetes Other    Stroke Other    Heart disease Other    Colon cancer Mother     Social History   Socioeconomic History   Marital status: Married    Spouse name: Not on file   Number of children: Not on file   Years of education: Not on file   Highest education level: Not on file  Occupational History   Not on file  Tobacco Use   Smoking status: Former   Smokeless tobacco: Never  Substance and Sexual Activity   Alcohol use: Yes    Comment: rarely   Drug use: No   Sexual activity: Not on  file  Other Topics Concern   Not on file  Social History Narrative   Married    Former smoker   Set designer and teaches tap dancing.    No etoh   HH of 2    Social Determinants of Health   Financial Resource Strain: Low Risk  (10/28/2022)   Overall Financial Resource Strain (CARDIA)    Difficulty of Paying Living Expenses: Not hard at all  Food Insecurity: No Food Insecurity (10/28/2022)   Hunger Vital Sign    Worried About Running Out of Food in the Last Year: Never true    Ran Out of Food in the Last Year: Never true  Transportation Needs: No Transportation Needs (10/28/2022)   PRAPARE - Hydrologist (Medical): No    Lack of Transportation (Non-Medical): No  Physical Activity: Insufficiently Active (10/28/2022)   Exercise Vital Sign    Days of Exercise per Week: 1 day    Minutes of Exercise per Session: 60 min  Stress: No Stress Concern Present (10/28/2022)   [place]    Feeling of Stress : Not at all  Social Connections: [place] (10/28/2022)   Social Connection and Isolation Panel [NHANES]    Frequency of Communication with Friends and Family: More than three times a week    Frequency of Social Gatherings with Friends and Family: More than three times a week    Attends Religious Services: More than 4 times per year    Active Member of [employer] or Organizations: Yes    Attends Music therapist: More than 4 times per year    Marital Status: Married    Outpatient Medications Prior to Visit  Medication Sig Dispense Refill   Cholecalciferol (VITAMIN D3) 2000 units TABS Take 2,000 Units by mouth daily.     estradiol (ESTRACE) 0.1 MG/GM vaginal cream 1 -2 gram intravaginally 2  x per week 42.5 g 3   magnesium gluconate (MAGONATE) 500 MG tablet Take 500 mg by mouth daily.     Multiple Vitamins-Minerals (PRESERVISION AREDS 2 PO) Take 1 tablet by mouth in the morning and at bedtime.     simvastatin (ZOCOR) 20 MG tablet TAKE 1 TABLET(20 MG) BY MOUTH AT BEDTIME 90 tablet 3   SYNTHROID 100 MCG tablet TAKE 1 TABLET (100 MCG TOTAL) DAILY BEFORE BREAKFAST. 90 tablet 0   tobramycin (TOBREX) 0.3 % ophthalmic solution   4   Eluxadoline 75 MG TABS Take by mouth as needed.     No facility-administered medications prior to visit.     EXAM:  BP 120/68 (BP Location: Right Arm, Patient Position: Sitting, Cuff Size: Normal)   Pulse 71   Temp (!) 97.4 F (36.3 C) (Oral)   Ht 5' 0.5" (1.537 m)   Wt 107 lb 3.2 oz (48.6 kg)   SpO2 95%   BMI 20.59 kg/m   Body mass index is 20.59 kg/m. Wt Readings from Last 3 Encounters:  01/27/23 107 lb 3.2 oz (48.6 kg)  10/28/22 108 lb (49 kg)  01/08/22 108 lb 3.2 oz (49.1 kg)    Physical Exam: Vital signs reviewed [id number] is a well-developed well-nourished alert cooperative    who appearsr stated age in no acute distress.  HEENT: normocephalic atraumatic , Eyes: PERRL EOM's full,  conjunctiva clear, Nares: paten,t no deformity discharge or tenderness., Ears: no deformity EAC's clear TMs with normal landmarks. Mouth: clear OP, no lesions, edema.  Moist  mucous membranes. Dentition in adequate repair. NECK: supple without masses, thyromegaly or bruits. CHEST/PULM:  Clear to auscultation and percussion breath sounds equal no wheeze , rales or rhonchi.  Breast: normal by inspection . No dimpling, discharge, masses, tenderness or discharge . CV: PMI is nondisplaced, S1 S2 no gallops, murmurs, rubs. Peripheral pulses are full without delay.No JVD .  ABDOMEN: Bowel sounds normal nontender  No guard or rebound, no hepato splenomegal no CVA tenderness.  Extremtities:  No clubbing cyanosis or edema, no acute joint swelling or redness no focal atrophy NEURO:  Oriented x3, cranial nerves 3-12 appear to be intact, no obvious focal weakness,gait within normal limits no abnormal reflexes or asymmetrical SKIN: No acute rashes normal turgor, color, no bruising or petechiae. PSYCH: Oriented, good eye contact, no obvious depression anxiety, cognition and judgment appear normal. LN: no cervical axillary inguinal adenopathy  Lab Results  Component Value Date   WBC 7.4 01/01/2022   HGB 14.5 01/01/2022   HCT 44.1 01/01/2022   PLT 290.0 01/01/2022   GLUCOSE 92 01/19/2023   CHOL 168 01/19/2023   TRIG 144.0 01/19/2023   HDL 58.90 01/19/2023   LDLDIRECT 84.0 01/04/2021   LDLCALC 80 01/19/2023   ALT 15 01/19/2023   AST 17 01/19/2023   NA 140 01/19/2023   K 4.6 01/19/2023   CL 104 01/19/2023   CREATININE 0.89 01/19/2023   BUN 20 01/19/2023   CO2 27 01/19/2023   TSH 0.16 (L) 01/19/2023   INR 1.0 ratio 11/27/2010    BP Readings from Last 3 Encounters:  01/27/23 120/68  01/08/22 120/74  01/09/21 120/62    Lab results reviewed with patient   ASSESSMENT AND PLAN:  Discussed the following assessment and plan:    ICD-10-CM   1. Visit for preventive health examination  Z00.00      2. Hypothyroidism, unspecified type  E03.9    oversuppressed  plan dec dose and reassess    3. Medication management  Z79.899     4. Estrogen deficiency  E28.39 DG Bone Density     Return for tsh in  2-3 months after dose change and yearly .  Patient Care Team: Londyn Wotton, Standley Brooking, MD as PCP - General (Internal Medicine) Danella Sensing, MD (Dermatology) Richmond Campbell, MD (Gastroenterology) Elsie Saas, MD (Orthopedic Surgery) Sherlynn Stalls, MD as Consulting Physician (Ophthalmology) [place], Heather, [place] (Optometry) [name], [facility] (Inactive) as Pharmacist (Pharmacist) Patient Instructions  Decrease thyroid medication to take  6 days per week  ( off Sunday)  Plan check tsh in 2-3 months  (don' t have to fast)  Continue lifestyle intervention healthy eating and exercise .  Glad you are doing well.   I will order bone density fu dexa scan  at [facility] radiology facility  Call any time for appt. In next year.   Can call [phone] to schedule a Bone Density (DexaScan) a the [place] office at [place].   Lab Results  Component Value Date   WBC 7.4 01/01/2022   HGB 14.5 01/01/2022   HCT 44.1 01/01/2022   PLT 290.0 01/01/2022   GLUCOSE 92 01/19/2023   CHOL 168 01/19/2023   TRIG 144.0 01/19/2023   HDL 58.90 01/19/2023   LDLDIRECT 84.0 01/04/2021   LDLCALC 80 01/19/2023   ALT 15 01/19/2023   AST 17 01/19/2023   NA 140 01/19/2023   K 4.6 01/19/2023   CL 104 01/19/2023   CREATININE 0.89 01/19/2023   BUN 20 01/19/2023  CO2 27 01/19/2023   TSH 0.16 (L) 01/19/2023   INR 1.0 ratio 11/27/2010       Standley Brooking. Kahdijah Errickson M.D.

## 2023-01-27 ENCOUNTER — Ambulatory Visit (INDEPENDENT_AMBULATORY_CARE_PROVIDER_SITE_OTHER): Payer: Medicare Other | Admitting: Internal Medicine

## 2023-01-27 ENCOUNTER — Encounter: Payer: Self-pay | Admitting: Internal Medicine

## 2023-01-27 VITALS — BP 120/68 | HR 71 | Temp 97.4°F | Ht 60.5 in | Wt 107.2 lb

## 2023-01-27 DIAGNOSIS — Z0000 Encounter for general adult medical examination without abnormal findings: Secondary | ICD-10-CM

## 2023-01-27 DIAGNOSIS — Z79899 Other long term (current) drug therapy: Secondary | ICD-10-CM | POA: Diagnosis not present

## 2023-01-27 DIAGNOSIS — E039 Hypothyroidism, unspecified: Secondary | ICD-10-CM | POA: Diagnosis not present

## 2023-01-27 DIAGNOSIS — E2839 Other primary ovarian failure: Secondary | ICD-10-CM | POA: Diagnosis not present

## 2023-01-27 NOTE — Patient Instructions (Addendum)
Decrease thyroid medication to take  6 days per week  ( off Sunday)  Plan check tsh in 2-3 months  (don' t have to fast)  Continue lifestyle intervention healthy eating and exercise .  Glad you are doing well.   I will order bone density fu dexa scan  at [facility] radiology facility  Call any time for appt. In next year.   Can call [phone] to schedule a Bone Density (DexaScan) a the [place] office at [place].   Lab Results  Component Value Date   WBC 7.4 01/01/2022   HGB 14.5 01/01/2022   HCT 44.1 01/01/2022   PLT 290.0 01/01/2022   GLUCOSE 92 01/19/2023   CHOL 168 01/19/2023   TRIG 144.0 01/19/2023   HDL 58.90 01/19/2023   LDLDIRECT 84.0 01/04/2021   LDLCALC 80 01/19/2023   ALT 15 01/19/2023   AST 17 01/19/2023   NA 140 01/19/2023   K 4.6 01/19/2023   CL 104 01/19/2023   CREATININE 0.89 01/19/2023   BUN 20 01/19/2023   CO2 27 01/19/2023   TSH 0.16 (L) 01/19/2023   INR 1.0 ratio 11/27/2010

## 2023-01-29 ENCOUNTER — Telehealth: Payer: Self-pay | Admitting: Internal Medicine

## 2023-01-29 NOTE — Telephone Encounter (Signed)
Pt is returning [place] call

## 2023-01-29 NOTE — Telephone Encounter (Signed)
Pt called, asking to speak with CMA Tillie Rung) regarding a letter she received in the mail. CMA was unavailable. Pt asked that CMA call back at her earliest convenience.  [phone]

## 2023-01-29 NOTE — Telephone Encounter (Signed)
Attempt to reach pt. Left a voicemail to call us back.  

## 2023-01-30 NOTE — Telephone Encounter (Signed)
Patient called back - she went over her results with Dr. Regis Bill at her visit on 2/6. Nothing further needed.

## 2023-02-12 DIAGNOSIS — K080 Exfoliation of teeth due to systemic causes: Secondary | ICD-10-CM | POA: Diagnosis not present

## 2023-03-29 ENCOUNTER — Other Ambulatory Visit: Payer: Self-pay | Admitting: Internal Medicine

## 2023-03-31 DIAGNOSIS — K080 Exfoliation of teeth due to systemic causes: Secondary | ICD-10-CM | POA: Diagnosis not present

## 2023-04-06 ENCOUNTER — Telehealth: Payer: Self-pay | Admitting: Internal Medicine

## 2023-04-06 DIAGNOSIS — K080 Exfoliation of teeth due to systemic causes: Secondary | ICD-10-CM | POA: Diagnosis not present

## 2023-04-06 DIAGNOSIS — E039 Hypothyroidism, unspecified: Secondary | ICD-10-CM

## 2023-04-06 NOTE — Telephone Encounter (Signed)
Pt seen dr Fabian Sharp on 01-27-2023 and per AVS it said recheck tsh in 42-months. Please put order in

## 2023-04-07 DIAGNOSIS — K080 Exfoliation of teeth due to systemic causes: Secondary | ICD-10-CM | POA: Diagnosis not present

## 2023-04-09 NOTE — Telephone Encounter (Signed)
Pt has been sch for 04-10-2023

## 2023-04-10 ENCOUNTER — Other Ambulatory Visit (INDEPENDENT_AMBULATORY_CARE_PROVIDER_SITE_OTHER): Payer: Medicare Other

## 2023-04-10 DIAGNOSIS — E039 Hypothyroidism, unspecified: Secondary | ICD-10-CM | POA: Diagnosis not present

## 2023-04-10 LAB — TSH: TSH: 0.84 u[IU]/mL (ref 0.35–5.50)

## 2023-04-17 ENCOUNTER — Telehealth: Payer: Self-pay | Admitting: Internal Medicine

## 2023-04-17 DIAGNOSIS — E782 Mixed hyperlipidemia: Secondary | ICD-10-CM

## 2023-04-17 NOTE — Telephone Encounter (Signed)
Pt is calling and would like tsh results 

## 2023-04-22 NOTE — Telephone Encounter (Signed)
Ok to remain on current dose     please correct the sig on the  med list for synthroid

## 2023-04-22 NOTE — Telephone Encounter (Signed)
Apologies Thought she had her results . Tsh is in range 0.83  although  close to the over suppressed ( too high dose of medication range)   We could consider going down on dose  only taking 6 days per  week  or stay on same dose . And continue to follow  at least yearly tsh

## 2023-04-22 NOTE — Progress Notes (Signed)
See phone note  on med 6 days per week  follow

## 2023-04-22 NOTE — Telephone Encounter (Signed)
Attempted to reach pt. Left a voicemail to call us back.  

## 2023-04-23 MED ORDER — SYNTHROID 100 MCG PO TABS: ORAL_TABLET | ORAL | 0 refills | Status: DC

## 2023-04-23 NOTE — Telephone Encounter (Signed)
Inform pt. Verbalized understanding.   Med list updated with new instruction.

## 2023-04-29 DIAGNOSIS — H353112 Nonexudative age-related macular degeneration, right eye, intermediate dry stage: Secondary | ICD-10-CM | POA: Diagnosis not present

## 2023-04-29 DIAGNOSIS — H43813 Vitreous degeneration, bilateral: Secondary | ICD-10-CM | POA: Diagnosis not present

## 2023-04-29 DIAGNOSIS — H35371 Puckering of macula, right eye: Secondary | ICD-10-CM | POA: Diagnosis not present

## 2023-04-29 DIAGNOSIS — H353221 Exudative age-related macular degeneration, left eye, with active choroidal neovascularization: Secondary | ICD-10-CM | POA: Diagnosis not present

## 2023-05-13 DIAGNOSIS — H353221 Exudative age-related macular degeneration, left eye, with active choroidal neovascularization: Secondary | ICD-10-CM | POA: Diagnosis not present

## 2023-05-25 DIAGNOSIS — S46011A Strain of muscle(s) and tendon(s) of the rotator cuff of right shoulder, initial encounter: Secondary | ICD-10-CM | POA: Diagnosis not present

## 2023-05-27 DIAGNOSIS — K080 Exfoliation of teeth due to systemic causes: Secondary | ICD-10-CM | POA: Diagnosis not present

## 2023-06-08 DIAGNOSIS — M1612 Unilateral primary osteoarthritis, left hip: Secondary | ICD-10-CM | POA: Diagnosis not present

## 2023-06-23 DIAGNOSIS — D0461 Carcinoma in situ of skin of right upper limb, including shoulder: Secondary | ICD-10-CM | POA: Diagnosis not present

## 2023-07-17 ENCOUNTER — Encounter: Payer: Self-pay | Admitting: Internal Medicine

## 2023-07-17 DIAGNOSIS — M5431 Sciatica, right side: Secondary | ICD-10-CM | POA: Diagnosis not present

## 2023-07-17 NOTE — Telephone Encounter (Signed)
error 

## 2023-07-22 DIAGNOSIS — H35371 Puckering of macula, right eye: Secondary | ICD-10-CM | POA: Diagnosis not present

## 2023-07-22 DIAGNOSIS — H353221 Exudative age-related macular degeneration, left eye, with active choroidal neovascularization: Secondary | ICD-10-CM | POA: Diagnosis not present

## 2023-07-22 DIAGNOSIS — H353112 Nonexudative age-related macular degeneration, right eye, intermediate dry stage: Secondary | ICD-10-CM | POA: Diagnosis not present

## 2023-07-22 DIAGNOSIS — H43813 Vitreous degeneration, bilateral: Secondary | ICD-10-CM | POA: Diagnosis not present

## 2023-07-22 DIAGNOSIS — H43821 Vitreomacular adhesion, right eye: Secondary | ICD-10-CM | POA: Diagnosis not present

## 2023-08-05 DIAGNOSIS — M5441 Lumbago with sciatica, right side: Secondary | ICD-10-CM | POA: Diagnosis not present

## 2023-08-05 DIAGNOSIS — K080 Exfoliation of teeth due to systemic causes: Secondary | ICD-10-CM | POA: Diagnosis not present

## 2023-08-19 DIAGNOSIS — M5441 Lumbago with sciatica, right side: Secondary | ICD-10-CM | POA: Diagnosis not present

## 2023-08-25 DIAGNOSIS — L738 Other specified follicular disorders: Secondary | ICD-10-CM | POA: Diagnosis not present

## 2023-08-25 DIAGNOSIS — D225 Melanocytic nevi of trunk: Secondary | ICD-10-CM | POA: Diagnosis not present

## 2023-08-25 DIAGNOSIS — L570 Actinic keratosis: Secondary | ICD-10-CM | POA: Diagnosis not present

## 2023-08-25 DIAGNOSIS — Z85828 Personal history of other malignant neoplasm of skin: Secondary | ICD-10-CM | POA: Diagnosis not present

## 2023-08-25 DIAGNOSIS — L821 Other seborrheic keratosis: Secondary | ICD-10-CM | POA: Diagnosis not present

## 2023-09-23 DIAGNOSIS — H353220 Exudative age-related macular degeneration, left eye, stage unspecified: Secondary | ICD-10-CM | POA: Diagnosis not present

## 2023-09-30 DIAGNOSIS — H43813 Vitreous degeneration, bilateral: Secondary | ICD-10-CM | POA: Diagnosis not present

## 2023-09-30 DIAGNOSIS — H43821 Vitreomacular adhesion, right eye: Secondary | ICD-10-CM | POA: Diagnosis not present

## 2023-09-30 DIAGNOSIS — H35371 Puckering of macula, right eye: Secondary | ICD-10-CM | POA: Diagnosis not present

## 2023-09-30 DIAGNOSIS — H353221 Exudative age-related macular degeneration, left eye, with active choroidal neovascularization: Secondary | ICD-10-CM | POA: Diagnosis not present

## 2023-09-30 DIAGNOSIS — H353112 Nonexudative age-related macular degeneration, right eye, intermediate dry stage: Secondary | ICD-10-CM | POA: Diagnosis not present

## 2023-10-10 ENCOUNTER — Other Ambulatory Visit: Payer: Self-pay | Admitting: Internal Medicine

## 2023-10-10 DIAGNOSIS — E782 Mixed hyperlipidemia: Secondary | ICD-10-CM

## 2023-10-12 NOTE — Telephone Encounter (Signed)
Pt is call about the refill and want to know when will it be done.

## 2023-10-13 NOTE — Telephone Encounter (Signed)
Spoke to pt and inform her that her Rx was sent in on 10/11/2023. Advise her to let us know if she has any trouble getting it filled. Verbalized understanding.

## 2023-10-21 ENCOUNTER — Telehealth: Payer: Self-pay | Admitting: Internal Medicine

## 2023-10-21 DIAGNOSIS — E782 Mixed hyperlipidemia: Secondary | ICD-10-CM

## 2023-10-21 MED ORDER — SYNTHROID 100 MCG PO TABS: ORAL_TABLET | ORAL | 0 refills | Status: DC

## 2023-10-21 NOTE — Telephone Encounter (Signed)
Pt states she has been taking this medication for many, many years and normally gets her medication from   [facility] - [place], [place] - [address] Phone: [phone]  Fax: [phone]     This pharmacy claims they mailed her Rx, as they always do, on 10/16/23. Pt still has not received her package. Pt is worried because she only has 2 pills left.  Pt is wondering if MD will be able to send even just a few pills to her local pharmacy, to last her until she receives her Rx?

## 2023-10-22 NOTE — Telephone Encounter (Signed)
Spoke to pt. Inform her we sent in a 30 days supply to her local pharmacy yesterday.   Pt reports she may not need it. She contacted her pharmacy in [facility] They told her it should arrive at her house on Friday.   Pt reports she only needs a few of the medication. Asking if she can take an expired medication. Recommend her not, and to pick up a prescription from her pharmacy if need it. Verbalized understanding.   Pt reports she will contact the pharmacy if she ends up not needing it.

## 2023-11-03 ENCOUNTER — Ambulatory Visit (INDEPENDENT_AMBULATORY_CARE_PROVIDER_SITE_OTHER): Payer: Medicare Other | Admitting: Family Medicine

## 2023-11-03 VITALS — Wt 105.0 lb

## 2023-11-03 DIAGNOSIS — Z0000 Encounter for general adult medical examination without abnormal findings: Secondary | ICD-10-CM | POA: Diagnosis not present

## 2023-11-03 NOTE — Patient Instructions (Signed)
I really enjoyed getting to talk with you today! I am available on Tuesdays and Thursdays for virtual visits if you have any questions or concerns, or if I can be of any further assistance.   CHECKLIST FROM ANNUAL WELLNESS VISIT:  -Follow up (please call to schedule if not scheduled after visit):   -yearly for annual wellness visit with primary care office  Here is a list of your preventive care/health maintenance measures and the plan for each if any are due:  PLAN For any measures below that may be due:  -check to see if you had the tetanus booster with your pharmacy -let us know if you wish to do the bone density test  Health Maintenance  Topic Date Due   DTaP/Tdap/Td (2 - Tdap) 12/22/2018   COVID-19 Vaccine (6 - 2023-24 season) 11/06/2023   Medicare Annual Wellness (AWV)  11/02/2024   Pneumonia Vaccine 94+ Years old  Completed   INFLUENZA VACCINE  Completed   DEXA SCAN  Completed   Zoster Vaccines- Shingrix  Completed   HPV VACCINES  Aged Out    -See a dentist at least yearly  -Get your eyes checked and then per your eye specialist's recommendations  -Other issues addressed today:   -I have included below further information regarding a healthy whole foods based diet, physical activity guidelines for adults, stress management and opportunities for social connections. I hope you find this information useful.   -----------------------------------------------------------------------------------------------------------------------------------------------------------------------------------------------------------------------------------------------------------  NUTRITION: -eat real food: lots of colorful vegetables (half the plate) and fruits -5-7 servings of vegetables and fruits per day (fresh or steamed is best), exp. 2 servings of vegetables with lunch and dinner and 2 servings of fruit per day. Berries and greens such as kale and collards are great choices.  -consume on a  regular basis: whole grains (make sure first ingredient on label contains the word "whole"), fresh fruits, fish, nuts, seeds, healthy oils (such as olive oil, avocado oil, grape seed oil) -may eat small amounts of dairy and lean meat on occasion, but avoid processed meats such as ham, bacon, lunch meat, etc. -drink water -try to avoid fast food and pre-packaged foods, processed meat -most experts advise limiting sodium to < 2300mg  per day, should limit further is any chronic conditions such as high blood pressure, heart disease, diabetes, etc. The American Heart Association advised that < 1500mg  is is ideal -try to avoid foods that contain any ingredients with names you do not recognize  -try to avoid sugar/sweets (except for the natural sugar that occurs in fresh fruit) -try to avoid sweet drinks -try to avoid white rice, white bread, pasta (unless whole grain), white or yellow potatoes  EXERCISE GUIDELINES FOR ADULTS: -if you wish to increase your physical activity, do so gradually and with the approval of your doctor -STOP and seek medical care immediately if you have any chest pain, chest discomfort or trouble breathing when starting or increasing exercise  -move and stretch your body, legs, feet and arms when sitting for long periods -Physical activity guidelines for optimal health in adults: -least 150 minutes per week of aerobic exercise (can talk, but not sing) once approved by your doctor, 20-30 minutes of sustained activity or two 10 minute episodes of sustained activity every day.  -resistance training at least 2 days per week if approved by your doctor -balance exercises 3+ days per week:   Stand somewhere where you have something sturdy to hold onto if you lose balance.    1) lift  up on toes, start with 5x per day and work up to 20x   2) stand and lift on leg straight out to the side so that foot is a few inches of the floor, start with 5x each side and work up to 20x each side   3)  stand on one foot, start with 5 seconds each side and work up to 20 seconds on each side  If you need ideas or help with getting more active:  -Silver sneakers https://tools.silversneakers.com  -Walk with a Doc: http://www.duncan-williams.com/  -try to include resistance (weight lifting/strength building) and balance exercises twice per week: or the following link for ideas: http://castillo-powell.com/  BuyDucts.dk  STRESS MANAGEMENT: -can try meditating, or just sitting quietly with deep breathing while intentionally relaxing all parts of your body for 5 minutes daily -if you need further help with stress, anxiety or depression please follow up with your primary doctor or contact the wonderful folks at [facility]: [phone]  SOCIAL CONNECTIONS: -options in [place] if you wish to engage in more social and exercise related activities:  -Silver sneakers https://tools.silversneakers.com  -Walk with a Doc: http://www.duncan-williams.com/  -Check out the [facility] Active Adults 50+ section on the [place] of [employer] (hiking clubs, book clubs, cards and games, chess, exercise classes, aquatic classes and much more) - see the website for details: https://www.[place].gov/departments/parks-recreation/active-adults50  -YouTube has lots of exercise videos for different ages and abilities as well  -[facility] (a variety of indoor and outdoor inperson activities for adults). [phone]. [address]  -Virtual Online Classes (a variety of topics): see seniorplanet.org or call 2081203999  -consider volunteering at a school, hospice center, church, senior center or elsewhere

## 2023-11-03 NOTE — Progress Notes (Signed)
PATIENT CHECK-IN and HEALTH RISK ASSESSMENT QUESTIONNAIRE:  -completed by phone/video for upcoming Medicare Preventive Visit  PLEASE CHECK FIRST UNDER ROOMING TAB, then QUESTIONNAIRE to see if patient completed online questions. If so use those to complete some of these question ahead of time.   Pre-Visit Check-in: 1)Vitals (height, wt, BP, etc) - record in vitals section for visit on day of visit Request home vitals (wt, BP, etc.) and enter into vitals, THEN update Vital Signs SmartPhrase below at the top of the HPI. See below.  2)Review and Update Medications, Allergies PMH, Surgeries, Social history in Epic 3)Hospitalizations in the last year with date/reason?   4)Review and Update Care Team (patient's specialists) in Epic 5) Complete PHQ9 in Epic  6) Complete Fall Screening in Epic 7)Review all Health Maintenance Due and order under PCP if not done.  8)Medicare Wellness Questionnaire: Answer theses question about your habits: Do you drink alcohol? No  If yes, how many drinks do you have a day? N/A  Have you ever smoked? Yes  Quit date if applicable? 24 years ago   How many packs a day do/did you smoke? 3 cigs a day  Do you use smokeless tobacco? No  Do you use an illicit drugs? No  Do you exercises? Yes IF so, what type and how many days/minutes per week? Attend dance exercise classes 1-2 times/week walking while shopping  Are you sexually active? Some what Number of partners? 1 Typical breakfast smart yogurt with fruit, cereal and sometimes bagel with salmon and cream cheese  Typical lunch egg bites, half sandwich with fruit  Typical dinner Chicken, vegetables and a salad  Typical snacks: sometimes nuts   Beverages: water, lipton green tea, coffee   Answer theses question about you: Can you perform most household chores? Yes  Do you find it hard to follow a conversation in a noisy room? No  Do you often ask people to speak up or repeat themselves? No  Do you feel that you have  a problem with memory? No  Do you balance your checkbook and or bank acounts? Yes  Do you feel safe at home? Yes  Last dentist visit? August  Do you need assistance with any of the following: Please note if so No   Driving?  Feeding yourself?  Getting from bed to chair?  Getting to the toilet?  Bathing or showering?  Dressing yourself?  Managing money?  Climbing a flight of stairs  Preparing meals?  Do you have Advanced Directives in place (Living Will, Healthcare Power or Attorney)? Yes    Last eye Exam and location? October [facility]    Do you currently use prescribed or non-prescribed narcotic or opioid pain medications? No   Do you have a history or close family history of breast, ovarian, tubal or peritoneal cancer or a family member with BRCA (breast cancer susceptibility 1 and 2) gene mutations? No   Patient only weighs self in home no other vitals taken in home. Request home vitals (wt, BP, etc.) and enter into vitals, THEN update Vital Signs SmartPhrase below at the top of the HPI. See below.   Nurse/Assistant Credentials/time stamp:   ----------------------------------------------------------------------------------------------------------------------------------------------------------------------------------------------------------------------  Because this visit was a virtual/telehealth visit, some criteria may be missing or patient reported. Any vitals not documented were not able to be obtained and vitals that have been documented are patient reported.    MEDICARE ANNUAL PREVENTIVE VISIT WITH PROVIDER: (Welcome to Medicare, initial annual wellness or annual wellness exam)  Virtual Visit via Phone Note  I connected with Christina Myers on 11/03/23 by phone and verified that I am speaking with the correct person using two identifiers.  Location patient: home Location provider:work or home office Persons participating in the virtual visit:  patient, provider  Concerns and/or follow up today: reports has been doing well. Rarely (on two occasions) has had a little indigestion or heartburn after an evening meal. Tums has helped. Has been fine in between episodes. This was about two months ago - has been ok since.    See HM section in Epic for other details of completed HM.    ROS: negative for report of fevers, unintentional weight loss, vision changes, vision loss, hearing loss or change, chest pain, sob, hemoptysis, melena, hematochezia, hematuria, falls, bleeding or bruising, thoughts of suicide or self harm, memory loss  Patient-completed extensive health risk assessment - reviewed and discussed with the patient: See Health Risk Assessment completed with patient prior to the visit either above or in recent phone note. This was reviewed in detailed with the patient today and appropriate recommendations, orders and referrals were placed as needed per Summary below and patient instructions.   Review of Medical History: -PMH, PSH, Family History and current specialty and care providers reviewed and updated and listed below   Patient Care Team: Panosh, Neta Mends, MD as PCP - General (Internal Medicine) Arminda Resides, MD (Dermatology) Sharrell Ku, MD (Gastroenterology) Salvatore Marvel, MD (Orthopedic Surgery) Stephannie Li, MD as Consulting Physician (Ophthalmology) [place], Heather, OD (Optometry) Verner Chol, [facility] (Inactive) as Pharmacist (Pharmacist)   Past Medical History:  Diagnosis Date   COLONIC POLYPS, HX OF 08/30/2007   Qualifier: Diagnosis of  By: Claiborne Billings CMA, Jacqualynn     Hx of colonic polyps    Hypertension    Hypothyroidism    OSTEOARTHRITIS, HAND 04/24/2009   Qualifier: Diagnosis of  By: Fabian Sharp MD, Neta Mends    PVC (premature ventricular contraction)    [facility] evaluation 7/03   Seasonal allergies    Skin cancer    hx of bcca of face   SKIN CANCER, HX OF 02/02/2008   Qualifier: Diagnosis of  By: Fabian Sharp  MD, Neta Mends     Past Surgical History:  Procedure Laterality Date   FACIAL COSMETIC SURGERY     TONSILLECTOMY     tubal lig      Social History   Socioeconomic History   Marital status: Married    Spouse name: Not on file   Number of children: Not on file   Years of education: Not on file   Highest education level: Not on file  Occupational History   Not on file  Tobacco Use   Smoking status: Former   Smokeless tobacco: Never  Substance and Sexual Activity   Alcohol use: Yes    Comment: rarely   Drug use: No   Sexual activity: Not on file  Other Topics Concern   Not on file  Social History Narrative   Married    Former smoker   Games developer and teaches tap dancing.    No etoh   HH of 2    Social Determinants of Health   Financial Resource Strain: Low Risk  (10/28/2022)   Overall Financial Resource Strain (CARDIA)    Difficulty of Paying Living Expenses: Not hard at all  Food Insecurity: No Food Insecurity (10/28/2022)   Hunger Vital Sign    Worried About Running Out of Food in the Last Year:  Never true    Ran Out of Food in the Last Year: Never true  Transportation Needs: No Transportation Needs (10/28/2022)   PRAPARE - Administrator, Civil Service (Medical): No    Lack of Transportation (Non-Medical): No  Physical Activity: Insufficiently Active (10/28/2022)   Exercise Vital Sign    Days of Exercise per Week: 1 day    Minutes of Exercise per Session: 60 min  Stress: No Stress Concern Present (10/28/2022)   [employer] of Occupational Health - Occupational Stress Questionnaire    Feeling of Stress : Not at all  Social Connections: Socially Integrated (10/28/2022)   Social Connection and Isolation Panel [NHANES]    Frequency of Communication with Friends and Family: More than three times a week    Frequency of Social Gatherings with Friends and Family: More than three times a week    Attends Religious Services: More than 4 times per year     Active Member of [employer] or Organizations: Yes    Attends Engineer, structural: More than 4 times per year    Marital Status: Married  Catering manager Violence: Not At Risk (10/28/2022)   Humiliation, Afraid, Rape, and Kick questionnaire    Fear of Current or Ex-Partner: No    Emotionally Abused: No    Physically Abused: No    Sexually Abused: No    Family History  Problem Relation Age of Onset   Cancer Other        colon, prostate    Diabetes Other    Stroke Other    Heart disease Other    Colon cancer Mother     Current Outpatient Medications on File Prior to Visit  Medication Sig Dispense Refill   Cholecalciferol (VITAMIN D3) 2000 units TABS Take 2,000 Units by mouth daily.     Eluxadoline 75 MG TABS Take by mouth as needed.     estradiol (ESTRACE) 0.1 MG/GM vaginal cream 1 -2 gram intravaginally 2  x per week 42.5 g 3   magnesium gluconate (MAGONATE) 500 MG tablet Take 500 mg by mouth daily.     Multiple Vitamins-Minerals (PRESERVISION AREDS 2 PO) Take 1 tablet by mouth in the morning and at bedtime.     simvastatin (ZOCOR) 20 MG tablet TAKE 1 TABLET(20 MG) BY MOUTH AT BEDTIME 90 tablet 3   SYNTHROID 100 MCG tablet TAKE 1 TABLET (100 MCG TOTAL) DAILY BEFORE BREAKFAST 30 tablet 0   tobramycin (TOBREX) 0.3 % ophthalmic solution   4   No current facility-administered medications on file prior to visit.    Allergies  Allergen Reactions   Amoxicillin-Pot Clavulanate     REACTION: colitis  after ceclor  adn augmentin years ago   Cefdinir     REACTION: "colitis" diarrhea       Physical Exam Vitals requested from patient and listed below if patient had equipment and was able to obtain at home for this virtual visit: There were no vitals filed for this visit. Estimated body mass index is 20.17 kg/m as calculated from the following:   Height as of 01/27/23: 5' 0.5" (1.537 m).   Weight as of this encounter: 105 lb (47.6 kg).  EKG (optional): deferred due to virtual  visit  GENERAL: alert, oriented, no acute distress detected, full vision exam deferred due to pandemic and/or virtual encounter  PSYCH/NEURO: pleasant and cooperative, no obvious depression or anxiety, speech and thought processing grossly intact, Cognitive function grossly intact  [employer] Visit  from 11/03/2023 in [facility]  PHQ-9 Total Score 0           11/03/2023    4:38 PM 01/27/2023    2:18 PM 10/28/2022    3:47 PM 01/08/2022   11:45 AM 10/23/2021    3:04 PM  Depression screen PHQ 2/9  Decreased Interest 0 0 0 0 0  Down, Depressed, Hopeless 0 0 0 0 0  PHQ - 2 Score 0 0 0 0 0  Altered sleeping 0 0  0   Tired, decreased energy 0 0  0   Change in appetite 0 0  0   Feeling bad or failure about yourself  0 0  0   Trouble concentrating 0 0  0   Moving slowly or fidgety/restless 0 0  0   Suicidal thoughts 0 0  0   PHQ-9 Score 0 0  0   Difficult doing work/chores Not difficult at all Not difficult at all          10/23/2021    3:07 PM 01/08/2022   11:45 AM 10/28/2022    3:49 PM 01/27/2023    1:57 PM 11/03/2023    4:38 PM  Fall Risk  Falls in the past year? 1 0 0 0 0  Was there an injury with Fall? 1  0 0 0  Was there an injury with Fall? - Comments back      Fall Risk Category Calculator 3  0 0 0  Fall Risk Category (Retired) High  Low    (RETIRED) Patient Fall Risk Level   Low fall risk    Patient at Risk for Falls Due to   No Fall Risks No Fall Risks No Fall Risks  Fall risk Follow up Falls prevention discussed  Falls prevention discussed Falls evaluation completed Falls evaluation completed     SUMMARY AND PLAN:  Encounter for Medicare annual wellness exam   Discussed applicable health maintenance/preventive health measures and advised and referred or ordered per patient preferences: -discussed tetanus booster- she is going to check with her pharmacy to see if already done and bring record if so -discussed bone density  and she is considering, PCP had already ordered but she had not yet done as was not sure why to do it, now she is considering. She is already doing weight bearing exercise - discussed bone building exercises. She also is taking calcium and vit D.   Health Maintenance  Topic Date Due   DTaP/Tdap/Td (2 - Tdap) 12/22/2018   COVID-19 Vaccine (6 - 2023-24 season) 11/06/2023   Medicare Annual Wellness (AWV)  11/02/2024   Pneumonia Vaccine 43+ Years old  Completed   INFLUENZA VACCINE  Completed   DEXA SCAN  Completed   Zoster Vaccines- Shingrix  Completed   HPV VACCINES  Aged Out   Education and counseling on the following was provided based on the above review of health and a plan/checklist for the patient, along with additional information discussed, was provided for the patient in the patient instructions :   -Advised and counseled on a healthy lifestyle - including the importance of a healthy diet, regular physical activity, social connections -Reviewed patient's current diet. Advised and counseled on a whole foods based healthy diet. A summary of a healthy diet was provided in the Patient Instructions.  -reviewed patient's current physical activity level and discussed exercise guidelines for adults. Discussed community resources and ideas for safe exercise at home to assist in meeting  exercise guideline recommendations in a safe and healthy way.  -Advise yearly dental visits at minimum and regular eye exams   Follow up: see patient instructions     Patient Instructions  I really enjoyed getting to talk with you today! I am available on Tuesdays and Thursdays for virtual visits if you have any questions or concerns, or if I can be of any further assistance.   CHECKLIST FROM ANNUAL WELLNESS VISIT:  -Follow up (please call to schedule if not scheduled after visit):   -yearly for annual wellness visit with primary care office  Here is a list of your preventive care/health maintenance  measures and the plan for each if any are due:  PLAN For any measures below that may be due:  -check to see if you had the tetanus booster with your pharmacy -let us know if you wish to do the bone density test  Health Maintenance  Topic Date Due   DTaP/Tdap/Td (2 - Tdap) 12/22/2018   COVID-19 Vaccine (6 - 2023-24 season) 11/06/2023   Medicare Annual Wellness (AWV)  11/02/2024   Pneumonia Vaccine 25+ Years old  Completed   INFLUENZA VACCINE  Completed   DEXA SCAN  Completed   Zoster Vaccines- Shingrix  Completed   HPV VACCINES  Aged Out    -See a dentist at least yearly  -Get your eyes checked and then per your eye specialist's recommendations  -Other issues addressed today:   -I have included below further information regarding a healthy whole foods based diet, physical activity guidelines for adults, stress management and opportunities for social connections. I hope you find this information useful.   -----------------------------------------------------------------------------------------------------------------------------------------------------------------------------------------------------------------------------------------------------------  NUTRITION: -eat real food: lots of colorful vegetables (half the plate) and fruits -5-7 servings of vegetables and fruits per day (fresh or steamed is best), exp. 2 servings of vegetables with lunch and dinner and 2 servings of fruit per day. Berries and greens such as kale and collards are great choices.  -consume on a regular basis: whole grains (make sure first ingredient on label contains the word "whole"), fresh fruits, fish, nuts, seeds, healthy oils (such as olive oil, avocado oil, grape seed oil) -may eat small amounts of dairy and lean meat on occasion, but avoid processed meats such as ham, bacon, lunch meat, etc. -drink water -try to avoid fast food and pre-packaged foods, processed meat -most experts advise limiting  sodium to < 2300mg  per day, should limit further is any chronic conditions such as high blood pressure, heart disease, diabetes, etc. The American Heart Association advised that < 1500mg  is is ideal -try to avoid foods that contain any ingredients with names you do not recognize  -try to avoid sugar/sweets (except for the natural sugar that occurs in fresh fruit) -try to avoid sweet drinks -try to avoid white rice, white bread, pasta (unless whole grain), white or yellow potatoes  EXERCISE GUIDELINES FOR ADULTS: -if you wish to increase your physical activity, do so gradually and with the approval of your doctor -STOP and seek medical care immediately if you have any chest pain, chest discomfort or trouble breathing when starting or increasing exercise  -move and stretch your body, legs, feet and arms when sitting for long periods -Physical activity guidelines for optimal health in adults: -least 150 minutes per week of aerobic exercise (can talk, but not sing) once approved by your doctor, 20-30 minutes of sustained activity or two 10 minute episodes of sustained activity every day.  -resistance training at least 2  days per week if approved by your doctor -balance exercises 3+ days per week:   Stand somewhere where you have something sturdy to hold onto if you lose balance.    1) lift up on toes, start with 5x per day and work up to 20x   2) stand and lift on leg straight out to the side so that foot is a few inches of the floor, start with 5x each side and work up to 20x each side   3) stand on one foot, start with 5 seconds each side and work up to 20 seconds on each side  If you need ideas or help with getting more active:  -Silver sneakers https://tools.silversneakers.com  -Walk with a Doc: http://www.duncan-williams.com/  -try to include resistance (weight lifting/strength building) and balance exercises twice per week: or the following link for  ideas: http://castillo-powell.com/  BuyDucts.dk  STRESS MANAGEMENT: -can try meditating, or just sitting quietly with deep breathing while intentionally relaxing all parts of your body for 5 minutes daily -if you need further help with stress, anxiety or depression please follow up with your primary doctor or contact the wonderful folks at [facility]: [phone]  SOCIAL CONNECTIONS: -options in [place] if you wish to engage in more social and exercise related activities:  -Silver sneakers https://tools.silversneakers.com  -Walk with a Doc: http://www.duncan-williams.com/  -Check out the [facility] Active Adults 50+ section on the [place] of [employer] (hiking clubs, book clubs, cards and games, chess, exercise classes, aquatic classes and much more) - see the website for details: https://www.[place].gov/departments/parks-recreation/active-adults50  -YouTube has lots of exercise videos for different ages and abilities as well  -[facility] (a variety of indoor and outdoor inperson activities for adults). [phone]. [address]  -Virtual Online Classes (a variety of topics): see seniorplanet.org or call [phone]  -consider volunteering at a school, hospice center, church, senior center or elsewhere           Terressa Koyanagi, DO

## 2023-11-11 DIAGNOSIS — M5441 Lumbago with sciatica, right side: Secondary | ICD-10-CM | POA: Diagnosis not present

## 2023-11-25 NOTE — Progress Notes (Signed)
Chief Complaint  Patient presents with   Digestive Issues    Pt reports about late September or October, she states her stomach is "clench up, couldn;t take one bite". Last episode was 2 days ago.     HPI: Christina Myers 82 y.o. come in for new problem  Last pv 2 24 and wellness 11 24  On go ing intermittent high epigastric pressure pain that causes unable to eat  not related to a given food  or situation although usually at the evening meal .   Had a guest for dinner  and  had just sat down to eat and had  upper  epigatric area and took tums  .  And passed.  Dull and  tight inno assic sx such as sob seating vomiting   Ocass gets  upper chest sx with swallowing but no true dysphagia .  Ocass heartburn like sx  may take a tums  but has been taking  gaviscon hs for ? How long.   Had been tryin gto lose weight since last check because of high TG and weight    Weight self  and has lost weight on person  29  . [name]# and now some weight loss  afew pounds unintentional . Was able to  eat holiday.  102  and 101 . [name] Orig 130#  and then avoided processed  carbs   usual goal weight  110 .  She is not particularly worried about low weight will still have a mc flury that she likes  ROS: See pertinent positives and negatives per HPI. No UTI sx  Back  paiin conditions    has had  injection and pred.  Not regular. . No other new meds or interventions  Last colon Dr Kinnie Scales.  Not taking snaids  asa  Past Medical History:  Diagnosis Date   COLONIC POLYPS, HX OF 08/30/2007   Qualifier: Diagnosis of  By: Claiborne Billings CMA, Jacqualynn     Hx of colonic polyps    Hypertension    Hypothyroidism    OSTEOARTHRITIS, HAND 04/24/2009   Qualifier: Diagnosis of  By: Fabian Sharp MD, Neta Mends    PVC (premature ventricular contraction)    [facility] evaluation 7/03   Seasonal allergies    Skin cancer    hx of bcca of face   SKIN CANCER, HX OF 02/02/2008   Qualifier: Diagnosis of  By: Fabian Sharp MD, Neta Mends     Family History   Problem Relation Age of Onset   Cancer Other        colon, prostate    Diabetes Other    Stroke Other    Heart disease Other    Colon cancer Mother     Social History   Socioeconomic History   Marital status: Married    Spouse name: Not on file   Number of children: Not on file   Years of education: Not on file   Highest education level: Not on file  Occupational History   Not on file  Tobacco Use   Smoking status: Former   Smokeless tobacco: Never  Substance and Sexual Activity   Alcohol use: Yes    Comment: rarely   Drug use: No   Sexual activity: Not on file  Other Topics Concern   Not on file  Social History Narrative   Married    Former smoker   Games developer and teaches tap dancing.    No etoh   HH of 2  Social Determinants of Health   Financial Resource Strain: Low Risk  (10/28/2022)   Overall Financial Resource Strain (CARDIA)    Difficulty of Paying Living Expenses: Not hard at all  Food Insecurity: No Food Insecurity (10/28/2022)   Hunger Vital Sign    Worried About Running Out of Food in the Last Year: Never true    Ran Out of Food in the Last Year: Never true  Transportation Needs: No Transportation Needs (10/28/2022)   PRAPARE - Administrator, Civil Service (Medical): No    Lack of Transportation (Non-Medical): No  Physical Activity: Insufficiently Active (10/28/2022)   Exercise Vital Sign    Days of Exercise per Week: 1 day    Minutes of Exercise per Session: 60 min  Stress: No Stress Concern Present (10/28/2022)   [employer] of Occupational Health - Occupational Stress Questionnaire    Feeling of Stress : Not at all  Social Connections: Socially Integrated (10/28/2022)   Social Connection and Isolation Panel [NHANES]    Frequency of Communication with Friends and Family: More than three times a week    Frequency of Social Gatherings with Friends and Family: More than three times a week    Attends Religious Services: More than  4 times per year    Active Member of [employer] or Organizations: Yes    Attends Engineer, structural: More than 4 times per year    Marital Status: Married    Outpatient Medications Prior to Visit  Medication Sig Dispense Refill   Cholecalciferol (VITAMIN D3) 2000 units TABS Take 2,000 Units by mouth daily.     estradiol (ESTRACE) 0.1 MG/GM vaginal cream 1 -2 gram intravaginally 2  x per week 42.5 g 3   magnesium gluconate (MAGONATE) 500 MG tablet Take 500 mg by mouth daily.     Multiple Vitamins-Minerals (PRESERVISION AREDS 2 PO) Take 1 tablet by mouth in the morning and at bedtime.     simvastatin (ZOCOR) 20 MG tablet TAKE 1 TABLET(20 MG) BY MOUTH AT BEDTIME 90 tablet 3   SYNTHROID 100 MCG tablet TAKE 1 TABLET (100 MCG TOTAL) DAILY BEFORE BREAKFAST 30 tablet 0   tobramycin (TOBREX) 0.3 % ophthalmic solution   4   Eluxadoline 75 MG TABS Take by mouth as needed. (Patient not taking: Reported on 11/26/2023)     No facility-administered medications prior to visit.     EXAM:  BP 122/80 (BP Location: Left Arm, Patient Position: Sitting, Cuff Size: Normal)   Pulse (!) 55   Temp 97.7 F (36.5 C) (Oral)   Ht 5' 0.5" (1.537 m)   Wt 102 lb 3.2 oz (46.4 kg)   SpO2 98%   BMI 19.63 kg/m   Body mass index is 19.63 kg/m. Wt Readings from Last 3 Encounters:  11/26/23 102 lb 3.2 oz (46.4 kg)  11/03/23 105 lb (47.6 kg)  01/27/23 107 lb 3.2 oz (48.6 kg)    GENERAL: vitals reviewed and listed above, alert, oriented, appears well hydrated and in no acute distress  with obvious weight loss since last visit  HEENT: atraumatic, conjunctiva  clear, no obvious abnormalities on inspection of external nose and ears  NECK: no obvious masses on inspection palpation  LUNGS: clear to auscultation bilaterally, no wheezes, rales or rhonchi, good air movement CV: HRRR, no clubbing cyanosis or  peripheral edema nl cap refill  Abdomen:  Sof,t normal bowel sounds without hepatosplenomegaly, no guarding  rebound or masses no CVA tenderness Skin  bruise right  upper upper MS: moves all extremities without noticeable focal  abnormality PSYCH: pleasant and cooperative, no obvious depression cognition intact  Lab Results  Component Value Date   WBC 8.0 11/26/2023   HGB 14.3 11/26/2023   HCT 43.3 11/26/2023   PLT 332.0 11/26/2023   GLUCOSE 95 11/26/2023   CHOL 188 11/26/2023   TRIG 165.0 (H) 11/26/2023   HDL 63.60 11/26/2023   LDLDIRECT 84.0 01/04/2021   LDLCALC 92 11/26/2023   ALT 15 11/26/2023   AST 14 11/26/2023   NA 143 11/26/2023   K 4.3 11/26/2023   CL 106 11/26/2023   CREATININE 0.77 11/26/2023   BUN 19 11/26/2023   CO2 31 11/26/2023   TSH 0.51 11/26/2023   INR 1.0 ratio 11/27/2010   BP Readings from Last 3 Encounters:  11/26/23 122/80  01/27/23 120/68  01/08/22 120/74  Plan update lab today   ASSESSMENT AND PLAN:  Discussed the following assessment and plan:  Esophageal pain - suspected - Plan: Basic metabolic panel, Hepatic function panel, CBC with Differential/Platelet, TSH, US Abdomen Complete, Ambulatory referral to Gastroenterology  Epigastric pain - Plan: Basic metabolic panel, Hepatic function panel, CBC with Differential/Platelet, TSH, US Abdomen Complete, Ambulatory referral to Gastroenterology  Medication management - Plan: Basic metabolic panel, Hepatic function panel, CBC with Differential/Platelet, TSH  Hypothyroidism, unspecified type - Plan: Basic metabolic panel, Hepatic function panel, CBC with Differential/Platelet, TSH  Mixed hyperlipidemia - Plan: Lipid panel  Weight loss - some intentional and some ? unintentional? - Plan: Ambulatory referral to Gastroenterology Concern about some of weight loss unintentional  she has not allowed weight in office in past because avoids weighting is elevated  but wants to be a lower weight .  However  this is more than that plan . Sx sound like esophageal or GE valve  spasm . Sound severe when occurs  and  effected ability to eat at the time.  Apparently has taken antacids off and on for a whil but denies nocturnal or am sx . Plan abd [place] update labs and GI consult  .   Expectant management.  She should be contacted about this evaluation going forward. -Patient advised to return or notify health care team  if  new concerns arise.  Patient Instructions  This may be esophageal spasm and related to  possible reflux . Avoid further weight loss. Lab today to be sure all ok .  Add famotidine 20  Pepcid twice a day ( OTC that blocks acid but not around  time of taking thyroid medication)   Will be contacted about   getting an abdominal ultrasound.    And Gi referral .    Neta Mends. Daveena Elmore M.D.

## 2023-11-26 ENCOUNTER — Ambulatory Visit: Payer: Medicare Other | Admitting: Internal Medicine

## 2023-11-26 ENCOUNTER — Encounter: Payer: Self-pay | Admitting: Internal Medicine

## 2023-11-26 VITALS — BP 122/80 | HR 55 | Temp 97.7°F | Ht 60.5 in | Wt 102.2 lb

## 2023-11-26 DIAGNOSIS — R1013 Epigastric pain: Secondary | ICD-10-CM

## 2023-11-26 DIAGNOSIS — E039 Hypothyroidism, unspecified: Secondary | ICD-10-CM | POA: Diagnosis not present

## 2023-11-26 DIAGNOSIS — E782 Mixed hyperlipidemia: Secondary | ICD-10-CM

## 2023-11-26 DIAGNOSIS — Z79899 Other long term (current) drug therapy: Secondary | ICD-10-CM

## 2023-11-26 DIAGNOSIS — K2289 Other specified disease of esophagus: Secondary | ICD-10-CM | POA: Diagnosis not present

## 2023-11-26 DIAGNOSIS — R634 Abnormal weight loss: Secondary | ICD-10-CM

## 2023-11-26 LAB — CBC WITH DIFFERENTIAL/PLATELET
Basophils Absolute: 0 10*3/uL (ref 0.0–0.1)
Basophils Relative: 0.5 % (ref 0.0–3.0)
Eosinophils Absolute: 0.1 10*3/uL (ref 0.0–0.7)
Eosinophils Relative: 1.2 % (ref 0.0–5.0)
HCT: 43.3 % (ref 36.0–46.0)
Hemoglobin: 14.3 g/dL (ref 12.0–15.0)
Lymphocytes Relative: 21.4 % (ref 12.0–46.0)
Lymphs Abs: 1.7 10*3/uL (ref 0.7–4.0)
MCHC: 33.1 g/dL (ref 30.0–36.0)
MCV: 97.6 fL (ref 78.0–100.0)
Monocytes Absolute: 0.6 10*3/uL (ref 0.1–1.0)
Monocytes Relative: 8 % (ref 3.0–12.0)
Neutro Abs: 5.5 10*3/uL (ref 1.4–7.7)
Neutrophils Relative %: 68.9 % (ref 43.0–77.0)
Platelets: 332 10*3/uL (ref 150.0–400.0)
RBC: 4.43 Mil/uL (ref 3.87–5.11)
RDW: 13.4 % (ref 11.5–15.5)
WBC: 8 10*3/uL (ref 4.0–10.5)

## 2023-11-26 LAB — LIPID PANEL
Cholesterol: 188 mg/dL (ref 0–200)
HDL: 63.6 mg/dL (ref >39.00)
LDL Cholesterol: 92 mg/dL (ref 0–99)
NonHDL: 124.58
Total CHOL/HDL Ratio: 3
Triglycerides: 165 mg/dL — ABNORMAL HIGH (ref 0.0–149.0)
VLDL: 33 mg/dL (ref 0.0–40.0)

## 2023-11-26 LAB — BASIC METABOLIC PANEL
BUN: 19 mg/dL (ref 6–23)
CO2: 31 meq/L (ref 19–32)
Calcium: 9.5 mg/dL (ref 8.4–10.5)
Chloride: 106 meq/L (ref 96–112)
Creatinine, Ser: 0.77 mg/dL (ref 0.40–1.20)
GFR: 70.81 mL/min (ref >60.00)
Glucose, Bld: 95 mg/dL (ref 70–99)
Potassium: 4.3 meq/L (ref 3.5–5.1)
Sodium: 143 meq/L (ref 135–145)

## 2023-11-26 LAB — HEPATIC FUNCTION PANEL
ALT: 15 U/L (ref 0–35)
AST: 14 U/L (ref 0–37)
Albumin: 4.2 g/dL (ref 3.5–5.2)
Alkaline Phosphatase: 92 U/L (ref 39–117)
Bilirubin, Direct: 0.1 mg/dL (ref 0.0–0.3)
Total Bilirubin: 0.5 mg/dL (ref 0.2–1.2)
Total Protein: 6.3 g/dL (ref 6.0–8.3)

## 2023-11-26 LAB — TSH: TSH: 0.51 u[IU]/mL (ref 0.35–5.50)

## 2023-11-26 NOTE — Patient Instructions (Signed)
This may be esophageal spasm and related to  possible reflux . Avoid further weight loss. Lab today to be sure all ok .  Add famotidine 20  Pepcid twice a day ( OTC that blocks acid but not around  time of taking thyroid medication)   Will be contacted about   getting an abdominal ultrasound.    And Gi referral .

## 2023-11-27 ENCOUNTER — Encounter: Payer: Self-pay | Admitting: Internal Medicine

## 2023-12-01 NOTE — Progress Notes (Signed)
Triglycerides borderline but  almost in range  ldl is good range  Tsh in range but still may be a bit too  high dose  to maintain best bone health . Usually want the TSH to be in the 2 range and yours is 0.51.  how about we decrease the synthroid dose ( you werer taking 6 days per week ,  to   75 mcg per day( new rx 90 days and then recheck )  alternatively can decrease the 100 mcg to 5 days per week ( less favored approach)   Kidney liver blood count all normal range

## 2023-12-02 ENCOUNTER — Ambulatory Visit (HOSPITAL_BASED_OUTPATIENT_CLINIC_OR_DEPARTMENT_OTHER)
Admission: RE | Admit: 2023-12-02 | Discharge: 2023-12-02 | Disposition: A | Discharge status: Home / Self Care | Payer: Medicare Other | Source: Ambulatory Visit | Attending: Internal Medicine | Admitting: Internal Medicine

## 2023-12-02 DIAGNOSIS — R1013 Epigastric pain: Secondary | ICD-10-CM | POA: Diagnosis not present

## 2023-12-02 DIAGNOSIS — K2289 Other specified disease of esophagus: Secondary | ICD-10-CM | POA: Diagnosis not present

## 2023-12-06 NOTE — Progress Notes (Signed)
Ultrasound is normal and reassuring  liver and kidneys ok and no gall stone noted . No acute or concerning findings

## 2023-12-29 DIAGNOSIS — H43813 Vitreous degeneration, bilateral: Secondary | ICD-10-CM | POA: Diagnosis not present

## 2023-12-29 DIAGNOSIS — H43821 Vitreomacular adhesion, right eye: Secondary | ICD-10-CM | POA: Diagnosis not present

## 2023-12-29 DIAGNOSIS — H353221 Exudative age-related macular degeneration, left eye, with active choroidal neovascularization: Secondary | ICD-10-CM | POA: Diagnosis not present

## 2023-12-29 DIAGNOSIS — H353112 Nonexudative age-related macular degeneration, right eye, intermediate dry stage: Secondary | ICD-10-CM | POA: Diagnosis not present

## 2023-12-29 DIAGNOSIS — H35371 Puckering of macula, right eye: Secondary | ICD-10-CM | POA: Diagnosis not present

## 2024-01-06 DIAGNOSIS — M1612 Unilateral primary osteoarthritis, left hip: Secondary | ICD-10-CM | POA: Diagnosis not present

## 2024-01-07 DIAGNOSIS — L565 Disseminated superficial actinic porokeratosis (DSAP): Secondary | ICD-10-CM | POA: Diagnosis not present

## 2024-01-07 DIAGNOSIS — Z85828 Personal history of other malignant neoplasm of skin: Secondary | ICD-10-CM | POA: Diagnosis not present

## 2024-01-07 DIAGNOSIS — L821 Other seborrheic keratosis: Secondary | ICD-10-CM | POA: Diagnosis not present

## 2024-01-07 DIAGNOSIS — L905 Scar conditions and fibrosis of skin: Secondary | ICD-10-CM | POA: Diagnosis not present

## 2024-01-15 ENCOUNTER — Telehealth: Payer: Self-pay

## 2024-01-19 ENCOUNTER — Encounter: Payer: Self-pay | Admitting: Internal Medicine

## 2024-01-19 ENCOUNTER — Telehealth: Payer: Self-pay | Admitting: Internal Medicine

## 2024-01-19 ENCOUNTER — Ambulatory Visit (INDEPENDENT_AMBULATORY_CARE_PROVIDER_SITE_OTHER): Payer: Medicare Other | Admitting: Internal Medicine

## 2024-01-19 ENCOUNTER — Other Ambulatory Visit: Payer: Self-pay | Admitting: Internal Medicine

## 2024-01-19 VITALS — BP 124/80 | HR 76 | Temp 97.9°F | Ht 60.5 in | Wt 102.0 lb

## 2024-01-19 DIAGNOSIS — Z01818 Encounter for other preprocedural examination: Secondary | ICD-10-CM | POA: Diagnosis not present

## 2024-01-19 DIAGNOSIS — R233 Spontaneous ecchymoses: Secondary | ICD-10-CM

## 2024-01-19 DIAGNOSIS — E782 Mixed hyperlipidemia: Secondary | ICD-10-CM

## 2024-01-19 DIAGNOSIS — E039 Hypothyroidism, unspecified: Secondary | ICD-10-CM

## 2024-01-19 DIAGNOSIS — M1610 Unilateral primary osteoarthritis, unspecified hip: Secondary | ICD-10-CM

## 2024-01-19 DIAGNOSIS — Z79899 Other long term (current) drug therapy: Secondary | ICD-10-CM | POA: Diagnosis not present

## 2024-01-19 NOTE — Progress Notes (Signed)
Last vitamin D Lab Results  Component Value Date   VD25OH 47.19 01/19/2023   Lab ordered for cpe and pre op

## 2024-01-19 NOTE — Patient Instructions (Signed)
Will send in form   to  your surgeon . Should have  some blood count done pre operatively.

## 2024-01-19 NOTE — Telephone Encounter (Signed)
Spoke to pt. Explain to patient that we did receive her surgical clearance form and she was seen last month. Wasn't sure if Dr. Fabian Sharp would still require for her to come in. And provider was out last week and that's form was not address. Pt explain that this surgery is new and doesn't think provider is aware of it. She needs the form to be send as soon as possible.   Offer pt to be seen on next Tuesday Feb.4. pt states she cannot wait that long. The surgical scheduler needs the form to secure her appt for 3/25. Pt continues that she has plan to go somewhere and would like to get well before going to the place.   Pt ask to be seen today. Inform pt provider does not have any available today but maybe tomorrow . Pt states she needs to be seen today and request for Dr. Fabian Sharp to make slot for her. Just 15 mins.   Relay to pt, there is no open slot but will ask provider. Also offer to speak to surgical scheduler. Pt verbalized understanding and request to call her back.   Attempted to reach Silvestre Mesi. Left a voiccemail to call us back.

## 2024-01-19 NOTE — Progress Notes (Signed)
Chief Complaint  Patient presents with   Pre-op Exam    Pt is here for surgical clearance for L hip replacement.     HPI: Christina Myers 83 y.o. come in as add on today for preoperative evaluation  for THA left Dr Dion Saucier. Trying to get appt date for March 25  and needs eval to get on schedule with surgeon.  Doing well except hip bothering her .  No change in exercise tolerance  No new sx  cv pulm  .  No bleeding but easily  bruise  spots   on shins and if skin is pressured  no truncal bruising and no petechia  Thyroid taking med 5 days per week .  Esophageal sx much better on pepid bid  and doing much better . ROS: See pertinent positives and negatives per HPI. Top of hair is thinning  asks about this  trying a hair  dresser remedy  . No UTI sx   Past Medical History:  Diagnosis Date   COLONIC POLYPS, HX OF 08/30/2007   Qualifier: Diagnosis of  By: Claiborne Billings CMA, Jacqualynn     Hx of colonic polyps    Hypertension    Hypothyroidism    OSTEOARTHRITIS, HAND 04/24/2009   Qualifier: Diagnosis of  By: Fabian Sharp MD, Neta Mends    PVC (premature ventricular contraction)    [facility] evaluation 7/03   Seasonal allergies    Skin cancer    hx of bcca of face   SKIN CANCER, HX OF 02/02/2008   Qualifier: Diagnosis of  By: Fabian Sharp MD, Neta Mends     Family History  Problem Relation Age of Onset   Cancer Other        colon, prostate    Diabetes Other    Stroke Other    Heart disease Other    Colon cancer Mother     Social History   Socioeconomic History   Marital status: Married    Spouse name: Not on file   Number of children: Not on file   Years of education: Not on file   Highest education level: Not on file  Occupational History   Not on file  Tobacco Use   Smoking status: Former   Smokeless tobacco: Never  Substance and Sexual Activity   Alcohol use: Yes    Comment: rarely   Drug use: No   Sexual activity: Not on file  Other Topics Concern   Not on file  Social History  Narrative   Married    Former smoker   Games developer and teaches tap dancing.    No etoh   HH of 2    Social Drivers of Corporate investment banker Strain: Low Risk  (10/28/2022)   Overall Financial Resource Strain (CARDIA)    Difficulty of Paying Living Expenses: Not hard at all  Food Insecurity: No Food Insecurity (10/28/2022)   Hunger Vital Sign    Worried About Running Out of Food in the Last Year: Never true    Ran Out of Food in the Last Year: Never true  Transportation Needs: No Transportation Needs (10/28/2022)   PRAPARE - Administrator, Civil Service (Medical): No    Lack of Transportation (Non-Medical): No  Physical Activity: Insufficiently Active (10/28/2022)   Exercise Vital Sign    Days of Exercise per Week: 1 day    Minutes of Exercise per Session: 60 min  Stress: No Stress Concern Present (10/28/2022)   [employer]  of Occupational Health - Occupational Stress Questionnaire    Feeling of Stress : Not at all  Social Connections: Socially Integrated (10/28/2022)   Social Connection and Isolation Panel [NHANES]    Frequency of Communication with Friends and Family: More than three times a week    Frequency of Social Gatherings with Friends and Family: More than three times a week    Attends Religious Services: More than 4 times per year    Active Member of [employer] or Organizations: Yes    Attends Engineer, structural: More than 4 times per year    Marital Status: Married    Outpatient Medications Prior to Visit  Medication Sig Dispense Refill   Cholecalciferol (VITAMIN D3) 2000 units TABS Take 2,000 Units by mouth daily.     estradiol (ESTRACE) 0.1 MG/GM vaginal cream 1 -2 gram intravaginally 2  x per week 42.5 g 3   magnesium gluconate (MAGONATE) 500 MG tablet Take 500 mg by mouth daily.     Multiple Vitamins-Minerals (PRESERVISION AREDS 2 PO) Take 1 tablet by mouth in the morning and at bedtime.     simvastatin (ZOCOR) 20 MG tablet TAKE 1  TABLET(20 MG) BY MOUTH AT BEDTIME 90 tablet 3   SYNTHROID 100 MCG tablet TAKE 1 TABLET (100 MCG TOTAL) DAILY BEFORE BREAKFAST (Patient taking differently: TAKE 1 TABLET (100 MCG TOTAL) DAILY BEFORE BREAKFAST. Taking on Monday, Tuesday, Thursday, Friday and Saturday.) 30 tablet 0   Eluxadoline 75 MG TABS Take by mouth as needed. (Patient not taking: Reported on 01/19/2024)     tobramycin (TOBREX) 0.3 % ophthalmic solution  (Patient not taking: Reported on 01/19/2024)  4   No facility-administered medications prior to visit.     EXAM:  BP 124/80 (BP Location: Left Arm, Patient Position: Sitting, Cuff Size: Normal)   Pulse 76   Temp 97.9 F (36.6 C) (Oral)   Ht 5' 0.5" (1.537 m)   Wt 102 lb (46.3 kg) Comment: pt reports her weight this morning from home.  SpO2 96%   BMI 19.59 kg/m   Body mass index is 19.59 kg/m.  GENERAL: vitals reviewed and listed above, alert, oriented, appears well hydrated and in no acute distress HEENT: atraumatic, conjunctiva  clear, no obvious abnormalities on inspection of external nose and ears  NECK: no obvious masses on inspection palpation  LUNGS: clear to auscultation bilaterally, no wheezes, rales or rhonchi, good air movement CV: HRRR, no clubbing cyanosis or  peripheral edema nl cap refill  Abdomen:  Sof,t normal bowel sounds without hepatosplenomegaly, no guarding rebound or masses no CVA tenderness MS: moves all extremities without noticeable focal  abnormality Skin no petechia  few areas shin small ecchymosis and area unler left wrist  ( imprinted)  PSYCH: pleasant and cooperative, no obvious depression or anxiety Lab Results  Component Value Date   WBC 8.0 11/26/2023   HGB 14.3 11/26/2023   HCT 43.3 11/26/2023   PLT 332.0 11/26/2023   GLUCOSE 95 11/26/2023   CHOL 188 11/26/2023   TRIG 165.0 (H) 11/26/2023   HDL 63.60 11/26/2023   LDLDIRECT 84.0 01/04/2021   LDLCALC 92 11/26/2023   ALT 15 11/26/2023   AST 14 11/26/2023   NA 143 11/26/2023    K 4.3 11/26/2023   CL 106 11/26/2023   CREATININE 0.77 11/26/2023   BUN 19 11/26/2023   CO2 31 11/26/2023   TSH 0.51 11/26/2023   INR 1.0 ratio 11/27/2010   BP Readings from Last 3 Encounters:  01/19/24  124/80  11/26/23 122/80  01/27/23 120/68    ASSESSMENT AND PLAN:  Discussed the following assessment and plan:  Hip arthritis  Pre-operative clearance  Medication management  Hypothyroidism, unspecified type - on replacement   decreaing dose Optimized for surgery  no high risk concerns other than age.  Will have labs  and cpe in Feb to make sure has cbc f/u tsh labs . -Patient advised to return or notify health care team  if  new concerns arise.  Patient Instructions  Will send in form   to  your surgeon . Should have  some blood count done pre operatively.    Neta Mends. Anayeli Arel M.D.

## 2024-01-19 NOTE — Telephone Encounter (Signed)
added appt  on as urgent SDA today and form completed

## 2024-01-19 NOTE — Telephone Encounter (Signed)
Copied from CRM [id number]. Topic: General - Other >> Jan 18, 2024  5:11 PM Corin V wrote: Reason for CRM: Patient called and said her orthopedist's office is still waiting on the surgical clearance paperwork for her to be scheduled. She is trying to secure a surgery for 03/15/24 but the office cannot schedule until the receive the form back. Patient is very upset as she said the office sent the paperwork to Dr. Fabian Sharp 10 days ago. She said they are holding the surgery date for her until tomorrow and if the paperwork can be signed and sent back first thing in the morning she may still be able to get it done 3/25. Please call Alfonso Ramus- Surgical coordinator to confirm it is received once faxed and then call the patient to let her know they have the paperwork so she can be scheduled. Patient was unsure of the orthopedist's full number, but said Cordelia Pen was at extension [phone].

## 2024-01-20 ENCOUNTER — Telehealth: Payer: Self-pay

## 2024-01-20 ENCOUNTER — Ambulatory Visit: Payer: Medicare Other | Admitting: Internal Medicine

## 2024-01-20 NOTE — Telephone Encounter (Signed)
Copied from CRM [id number]. Topic: Clinical - Medication Question >> Jan 20, 2024 12:33 PM Sonny Dandy B wrote: Reason for CRM: Pt called to speak with [place], requesting a call back at [phone]. Following up on documentation that was supposed to be sent to pt's ortho dr. Please faxed the form to [phone]

## 2024-01-20 NOTE — Telephone Encounter (Signed)
Spoke to pt.   Inform her this cma fax the form and follow up with [facility] office today and spoke to [employer].   Tresa Endo confirm that they received the fax. This cma  requested to secured pt appt for 3/25 per pt request. She states she would leave a message for Sherri.   Pt verbalized understanding.

## 2024-01-27 ENCOUNTER — Encounter: Payer: Self-pay | Admitting: Internal Medicine

## 2024-01-27 ENCOUNTER — Ambulatory Visit (INDEPENDENT_AMBULATORY_CARE_PROVIDER_SITE_OTHER): Payer: Medicare Other

## 2024-01-27 DIAGNOSIS — E039 Hypothyroidism, unspecified: Secondary | ICD-10-CM | POA: Diagnosis not present

## 2024-01-27 DIAGNOSIS — Z79899 Other long term (current) drug therapy: Secondary | ICD-10-CM | POA: Diagnosis not present

## 2024-01-27 DIAGNOSIS — Z01818 Encounter for other preprocedural examination: Secondary | ICD-10-CM

## 2024-01-27 DIAGNOSIS — E782 Mixed hyperlipidemia: Secondary | ICD-10-CM

## 2024-01-27 DIAGNOSIS — M1610 Unilateral primary osteoarthritis, unspecified hip: Secondary | ICD-10-CM | POA: Diagnosis not present

## 2024-01-27 DIAGNOSIS — R233 Spontaneous ecchymoses: Secondary | ICD-10-CM

## 2024-01-27 LAB — LIPID PANEL
Cholesterol: 165 mg/dL (ref 0–200)
HDL: 62.7 mg/dL (ref >39.00)
LDL Cholesterol: 75 mg/dL (ref 0–99)
NonHDL: 102.26
Total CHOL/HDL Ratio: 3
Triglycerides: 137 mg/dL (ref 0.0–149.0)
VLDL: 27.4 mg/dL (ref 0.0–40.0)

## 2024-01-27 LAB — TSH: TSH: 2.4 u[IU]/mL (ref 0.35–5.50)

## 2024-01-27 LAB — CBC WITH DIFFERENTIAL/PLATELET
Basophils Absolute: 0 10*3/uL (ref 0.0–0.1)
Basophils Relative: 0.5 % (ref 0.0–3.0)
Eosinophils Absolute: 0.1 10*3/uL (ref 0.0–0.7)
Eosinophils Relative: 1.2 % (ref 0.0–5.0)
HCT: 42.7 % (ref 36.0–46.0)
Hemoglobin: 14.3 g/dL (ref 12.0–15.0)
Lymphocytes Relative: 29.8 % (ref 12.0–46.0)
Lymphs Abs: 1.6 10*3/uL (ref 0.7–4.0)
MCHC: 33.4 g/dL (ref 30.0–36.0)
MCV: 97.3 fL (ref 78.0–100.0)
Monocytes Absolute: 0.7 10*3/uL (ref 0.1–1.0)
Monocytes Relative: 12.8 % — ABNORMAL HIGH (ref 3.0–12.0)
Neutro Abs: 3.1 10*3/uL (ref 1.4–7.7)
Neutrophils Relative %: 55.7 % (ref 43.0–77.0)
Platelets: 319 10*3/uL (ref 150.0–400.0)
RBC: 4.39 Mil/uL (ref 3.87–5.11)
RDW: 14.5 % (ref 11.5–15.5)
WBC: 5.5 10*3/uL (ref 4.0–10.5)

## 2024-01-27 LAB — HEMOGLOBIN A1C: Hgb A1c MFr Bld: 6 % (ref 4.6–6.5)

## 2024-01-27 LAB — T4, FREE: Free T4: 0.85 ng/dL (ref 0.60–1.60)

## 2024-01-27 LAB — PROTIME-INR
INR: 1.1 {ratio} — ABNORMAL HIGH (ref 0.8–1.0)
Prothrombin Time: 11.4 s (ref 9.6–13.1)

## 2024-01-27 LAB — BASIC METABOLIC PANEL
BUN: 15 mg/dL (ref 6–23)
CO2: 27 meq/L (ref 19–32)
Calcium: 9.2 mg/dL (ref 8.4–10.5)
Chloride: 104 meq/L (ref 96–112)
Creatinine, Ser: 0.77 mg/dL (ref 0.40–1.20)
GFR: 70.73 mL/min (ref >60.00)
Glucose, Bld: 89 mg/dL (ref 70–99)
Potassium: 3.9 meq/L (ref 3.5–5.1)
Sodium: 142 meq/L (ref 135–145)

## 2024-01-27 NOTE — Progress Notes (Addendum)
 Blood results  are good. Borderline out of range not clinically significant . Thyroid  is in correct range now ... Cholesterol in favorable range .

## 2024-02-02 NOTE — Progress Notes (Unsigned)
No chief complaint on file.   HPI: Patient  Christina Myers  83 y.o. comes in today for Preventive Health Care visit   Health Maintenance  Topic Date Due   DTaP/Tdap/Td (2 - Tdap) 12/22/2018   COVID-19 Vaccine (6 - 2024-25 season) 11/06/2023   Medicare Annual Wellness (AWV)  11/02/2024   Pneumonia Vaccine 58+ Years old  Completed   INFLUENZA VACCINE  Completed   DEXA SCAN  Completed   Zoster Vaccines- Shingrix  Completed   HPV VACCINES  Aged Out   Health Maintenance Review LIFESTYLE:  Exercise:   Tobacco/ETS: Alcohol:  Sugar beverages: Sleep: Drug use: no HH of  Work:    ROS:  GEN/ HEENT: No fever, significant weight changes sweats headaches vision problems hearing changes, CV/ PULM; No chest pain shortness of breath cough, syncope,edema  change in exercise tolerance. GI /GU: No adominal pain, vomiting, change in bowel habits. No blood in the stool. No significant GU symptoms. SKIN/HEME: ,no acute skin rashes suspicious lesions or bleeding. No lymphadenopathy, nodules, masses.  NEURO/ PSYCH:  No neurologic signs such as weakness numbness. No depression anxiety. IMM/ Allergy: No unusual infections.  Allergy .   REST of 12 system review negative except as per HPI   Past Medical History:  Diagnosis Date   COLONIC POLYPS, HX OF 08/30/2007   Qualifier: Diagnosis of  By: Claiborne Billings CMA, Jacqualynn     Hx of colonic polyps    Hypertension    Hypothyroidism    OSTEOARTHRITIS, HAND 04/24/2009   Qualifier: Diagnosis of  By: Fabian Sharp MD, Neta Mends    PVC (premature ventricular contraction)    [facility] evaluation 7/03   Seasonal allergies    Skin cancer    hx of bcca of face   SKIN CANCER, HX OF 02/02/2008   Qualifier: Diagnosis of  By: Fabian Sharp MD, Neta Mends     Past Surgical History:  Procedure Laterality Date   FACIAL COSMETIC SURGERY     TONSILLECTOMY     tubal lig      Family History  Problem Relation Age of Onset   Cancer Other        colon, prostate    Diabetes  Other    Stroke Other    Heart disease Other    Colon cancer Mother     Social History   Socioeconomic History   Marital status: Married    Spouse name: Not on file   Number of children: Not on file   Years of education: Not on file   Highest education level: Not on file  Occupational History   Not on file  Tobacco Use   Smoking status: Former   Smokeless tobacco: Never  Substance and Sexual Activity   Alcohol use: Yes    Comment: rarely   Drug use: No   Sexual activity: Not on file  Other Topics Concern   Not on file  Social History Narrative   Married    Former smoker   Games developer and teaches tap dancing.    No etoh   HH of 2    Social Drivers of Corporate investment banker Strain: Low Risk  (10/28/2022)   Overall Financial Resource Strain (CARDIA)    Difficulty of Paying Living Expenses: Not hard at all  Food Insecurity: No Food Insecurity (10/28/2022)   Hunger Vital Sign    Worried About Running Out of Food in the Last Year: Never true    Ran Out of Food in  the Last Year: Never true  Transportation Needs: No Transportation Needs (10/28/2022)   PRAPARE - Administrator, Civil Service (Medical): No    Lack of Transportation (Non-Medical): No  Physical Activity: Insufficiently Active (10/28/2022)   Exercise Vital Sign    Days of Exercise per Week: 1 day    Minutes of Exercise per Session: 60 min  Stress: No Stress Concern Present (10/28/2022)   [employer] of Occupational Health - Occupational Stress Questionnaire    Feeling of Stress : Not at all  Social Connections: Socially Integrated (10/28/2022)   Social Connection and Isolation Panel [NHANES]    Frequency of Communication with Friends and Family: More than three times a week    Frequency of Social Gatherings with Friends and Family: More than three times a week    Attends Religious Services: More than 4 times per year    Active Member of [employer] or Organizations: Yes    Attends Museum/gallery exhibitions officer: More than 4 times per year    Marital Status: Married    Outpatient Medications Prior to Visit  Medication Sig Dispense Refill   Cholecalciferol (VITAMIN D3) 2000 units TABS Take 2,000 Units by mouth daily.     Eluxadoline 75 MG TABS Take by mouth as needed. (Patient not taking: Reported on 01/19/2024)     estradiol (ESTRACE) 0.1 MG/GM vaginal cream 1 -2 gram intravaginally 2  x per week 42.5 g 3   magnesium gluconate (MAGONATE) 500 MG tablet Take 500 mg by mouth daily.     Multiple Vitamins-Minerals (PRESERVISION AREDS 2 PO) Take 1 tablet by mouth in the morning and at bedtime.     simvastatin (ZOCOR) 20 MG tablet TAKE 1 TABLET(20 MG) BY MOUTH AT BEDTIME 90 tablet 3   SYNTHROID 100 MCG tablet TAKE 1 TABLET (100 MCG TOTAL) DAILY BEFORE BREAKFAST (Patient taking differently: TAKE 1 TABLET (100 MCG TOTAL) DAILY BEFORE BREAKFAST. Taking on Monday, Tuesday, Thursday, Friday and Saturday.) 30 tablet 0   tobramycin (TOBREX) 0.3 % ophthalmic solution  (Patient not taking: Reported on 01/19/2024)  4   No facility-administered medications prior to visit.     EXAM:  There were no vitals taken for this visit.  There is no height or weight on file to calculate BMI. Wt Readings from Last 3 Encounters:  01/19/24 102 lb (46.3 kg)  11/26/23 102 lb 3.2 oz (46.4 kg)  11/03/23 105 lb (47.6 kg)    Physical Exam: Vital signs reviewed ZOX:WRUE is a well-developed well-nourished alert cooperative    who appearsr stated age in no acute distress.  HEENT: normocephalic atraumatic , Eyes: PERRL EOM's full, conjunctiva clear, Nares: paten,t no deformity discharge or tenderness., Ears: no deformity EAC's clear TMs with normal landmarks. Mouth: clear OP, no lesions, edema.  Moist mucous membranes. Dentition in adequate repair. NECK: supple without masses, thyromegaly or bruits. CHEST/PULM:  Clear to auscultation and percussion breath sounds equal no wheeze , rales or rhonchi. No chest wall  deformities or tenderness. Breast: normal by inspection . No dimpling, discharge, masses, tenderness or discharge . CV: PMI is nondisplaced, S1 S2 no gallops, murmurs, rubs. Peripheral pulses are full without delay.No JVD .  ABDOMEN: Bowel sounds normal nontender  No guard or rebound, no hepato splenomegal no CVA tenderness.  No hernia. Extremtities:  No clubbing cyanosis or edema, no acute joint swelling or redness no focal atrophy NEURO:  Oriented x3, cranial nerves 3-12 appear to be intact, no obvious focal weakness,gait within  normal limits no abnormal reflexes or asymmetrical SKIN: No acute rashes normal turgor, color, no bruising or petechiae. PSYCH: Oriented, good eye contact, no obvious depression anxiety, cognition and judgment appear normal. LN: no cervical axillary inguinal adenopathy  Lab Results  Component Value Date   WBC 5.5 01/27/2024   HGB 14.3 01/27/2024   HCT 42.7 01/27/2024   PLT 319.0 01/27/2024   GLUCOSE 89 01/27/2024   CHOL 165 01/27/2024   TRIG 137.0 01/27/2024   HDL 62.70 01/27/2024   LDLDIRECT 84.0 01/04/2021   LDLCALC 75 01/27/2024   ALT 15 11/26/2023   AST 14 11/26/2023   NA 142 01/27/2024   K 3.9 01/27/2024   CL 104 01/27/2024   CREATININE 0.77 01/27/2024   BUN 15 01/27/2024   CO2 27 01/27/2024   TSH 2.40 01/27/2024   INR 1.1 (H) 01/27/2024   HGBA1C 6.0 01/27/2024    BP Readings from Last 3 Encounters:  01/19/24 124/80  11/26/23 122/80  01/27/23 120/68    Lab results reviewed with patient   ASSESSMENT AND PLAN:  Discussed the following assessment and plan:    ICD-10-CM   1. Visit for preventive health examination  Z00.00      No follow-ups on file.  Patient Care Team: Niya Behler, Neta Mends, MD as PCP - General (Internal Medicine) Arminda Resides, MD (Dermatology) Sharrell Ku, MD (Gastroenterology) Salvatore Marvel, MD (Orthopedic Surgery) Stephannie Li, MD as Consulting Physician (Ophthalmology) [place], Heather, OD (Optometry) Verner Chol, [facility] (Inactive) as Pharmacist (Pharmacist) There are no Patient Instructions on file for this visit.  Neta Mends. Shanard Treto M.D.

## 2024-02-03 ENCOUNTER — Ambulatory Visit (INDEPENDENT_AMBULATORY_CARE_PROVIDER_SITE_OTHER): Payer: Medicare Other | Admitting: Internal Medicine

## 2024-02-03 ENCOUNTER — Encounter: Payer: Self-pay | Admitting: Internal Medicine

## 2024-02-03 VITALS — BP 106/58 | HR 59 | Temp 97.6°F | Ht <= 58 in | Wt 103.0 lb

## 2024-02-03 DIAGNOSIS — M1610 Unilateral primary osteoarthritis, unspecified hip: Secondary | ICD-10-CM

## 2024-02-03 DIAGNOSIS — Z79899 Other long term (current) drug therapy: Secondary | ICD-10-CM

## 2024-02-03 DIAGNOSIS — Z0000 Encounter for general adult medical examination without abnormal findings: Secondary | ICD-10-CM

## 2024-02-03 DIAGNOSIS — E039 Hypothyroidism, unspecified: Secondary | ICD-10-CM

## 2024-02-03 NOTE — Patient Instructions (Signed)
Good to see you today  Blood work all in range .  Continue thryoid meds 5 days per week for now . We can  change to a lower daily dose to try  at next refill to try and update the thyroid testing.  Cholesterol and triglycerides are  very good.

## 2024-02-16 ENCOUNTER — Telehealth: Payer: Self-pay

## 2024-02-16 NOTE — Telephone Encounter (Signed)
 Copied from CRM [id number]. Topic: Clinical - Lab/Test Results >> Feb 15, 2024  5:09 PM Mosetta Putt H wrote: Reason for CRM: need results relayed

## 2024-02-24 NOTE — Telephone Encounter (Signed)
 [name]

## 2024-02-25 NOTE — Progress Notes (Signed)
 Sent message, via epic in basket, requesting orders in epic from Careers adviser.

## 2024-02-29 DIAGNOSIS — M1612 Unilateral primary osteoarthritis, left hip: Secondary | ICD-10-CM | POA: Diagnosis not present

## 2024-03-01 NOTE — Patient Instructions (Signed)
 SURGICAL WAITING ROOM VISITATION Patients having surgery or a procedure may have no more than 2 support people in the waiting area - these visitors may rotate in the visitor waiting room.   Due to an increase in RSV and influenza rates and associated hospitalizations, children ages 78 and under may not visit patients in [facility] hospitals. If the patient needs to stay at the hospital during part of their recovery, the visitor guidelines for inpatient rooms apply.  PRE-OP VISITATION  Pre-op nurse will coordinate an appropriate time for 1 support person to accompany the patient in pre-op.  This support person may not rotate.  This visitor will be contacted when the time is appropriate for the visitor to come back in the pre-op area.  Please refer to the [facility] website for the visitor guidelines for Inpatients (after your surgery is over and you are in a regular room).  You are not required to quarantine at this time prior to your surgery. However, you must do this: Hand Hygiene often Do NOT share personal items Notify your provider if you are in close contact with someone who has COVID or you develop fever 100.4 or greater, new onset of sneezing, cough, sore throat, shortness of breath or body aches.  If you test positive for Covid or have been in contact with anyone that has tested positive in the last 10 days please notify you surgeon.    Your procedure is scheduled on:  TUESDAY  March 15, 2024  Report to [facility] Main Entrance: Leota Jacobsen entrance where the [employer] is available.   Report to admitting at:  07:00   AM  Call this number if you have any questions or problems the morning of surgery [phone]  Do not eat food after Midnight the night prior to your surgery/procedure.  After Midnight you may have the following liquids until 06:30 AM DAY OF SURGERY  Clear Liquid Diet Water Black Coffee (sugar ok, NO MILK/CREAM OR CREAMERS)  Tea (sugar ok, NO  MILK/CREAM OR CREAMERS) regular and decaf                             Plain Jell-O  with no fruit (NO RED)                                           Fruit ices (not with fruit pulp, NO RED)                                     Popsicles (NO RED)                                                                  Juice: NO CITRUS JUICES: only apple, WHITE grape, WHITE cranberry Sports drinks like Gatorade or Powerade (NO RED)                   The day of surgery:  Drink ONE (1) Pre-Surgery Clear Ensure at  06:30 AM the morning of surgery. Drink in  one sitting. Do not sip.  This drink was given to you during your hospital pre-op appointment visit. Nothing else to drink after completing the Pre-Surgery Clear Ensure : No candy, chewing gum or throat lozenges.    FOLLOW ANY ADDITIONAL PRE OP INSTRUCTIONS YOU RECEIVED FROM YOUR SURGEON'S OFFICE!!!   Oral Hygiene is also important to reduce your risk of infection.        Remember - BRUSH YOUR TEETH THE MORNING OF SURGERY WITH YOUR REGULAR TOOTHPASTE  Do NOT smoke after Midnight the night before surgery.  ESTRACE CREAM- Hold x 4 weeks to help prevent blood clots.   STOP TAKING all Vitamins, Herbs and supplements 1 week before your surgery.   Take ONLY these medicines the morning of surgery with A SIP OF WATER:  Synthroid. You may use your Systane Eye drops if needed.                    You may not have any metal on your body including hair pins, jewelry, and body piercing  Do not wear make-up, lotions, powders, perfumes or deodorant  Do not wear nail polish including gel and S&S, artificial / acrylic nails, or any other type of covering on natural nails including finger and toenails. If you have artificial nails, gel coating, etc., that needs to be removed by a nail salon, Please have this removed prior to surgery. Not doing so may mean that your surgery could be cancelled or delayed if the Surgeon or anesthesia staff feels like they are unable  to monitor you safely.   Do not shave 48 hours prior to surgery to avoid nicks in your skin which may contribute to postoperative infections.   Contacts, Hearing Aids, dentures or bridgework may not be worn into surgery. DENTURES WILL BE REMOVED PRIOR TO SURGERY PLEASE DO NOT APPLY "Poly grip" OR ADHESIVES!!!  Patients discharged on the day of surgery will not be allowed to drive home.  Someone NEEDS to stay with you for the first 24 hours after anesthesia.  Do not bring your home medications to the hospital. The Pharmacy will dispense medications listed on your medication list to you during your admission in the Hospital.  Special Instructions: Bring a copy of your healthcare power of attorney and living will documents the day of surgery, if you wish to have them scanned into your [facility] Records- EPIC  Please read over the following fact sheets you were given: IF YOU HAVE QUESTIONS ABOUT YOUR PRE-OP INSTRUCTIONS, PLEASE CALL [phone].     Pre-operative 5 CHG Bath Instructions   You can play a key role in reducing the risk of infection after surgery. Your skin needs to be as free of germs as possible. You can reduce the number of germs on your skin by washing with CHG (chlorhexidine gluconate) soap before surgery. CHG is an antiseptic soap that kills germs and continues to kill germs even after washing.   DO NOT use if you have an allergy to chlorhexidine/CHG or antibacterial soaps. If your skin becomes reddened or irritated, stop using the CHG and notify one of our RNs at [phone]  Please shower with the CHG soap starting 4 days before surgery using the following schedule: START SHOWERS ON   FRIDAY  March 11, 2024  Please keep in mind the following:  DO NOT shave, including legs and underarms,  starting the day of your first shower.   You may shave your face at any point before/day of surgery.   Place clean sheets on your bed the day you start using CHG soap. Use a clean washcloth (not used since being washed) for each shower. DO NOT sleep with pets once you start using the CHG.   CHG Shower Instructions:  If you choose to wash your hair and private area, wash first with your normal shampoo/soap.  After you use shampoo/soap, rinse your hair and body thoroughly to remove shampoo/soap residue.  Turn the water OFF and apply about 3 tablespoons (45 ml) of CHG soap to a CLEAN washcloth.  Apply CHG soap ONLY FROM YOUR NECK DOWN TO YOUR TOES (washing for 3-5 minutes)  DO NOT use CHG soap on face, private areas, open wounds, or sores.  Pay special attention to the area where your surgery is being performed.  If you are having back surgery, having someone wash your back for you may be helpful.  Wait 2 minutes after CHG soap is applied, then you may rinse off the CHG soap.  Pat dry with a clean towel  Put on clean clothes/pajamas   If you choose to wear lotion, please use ONLY the CHG-compatible lotions on the back of this paper.     Additional instructions for the day of surgery: DO NOT APPLY any lotions, deodorants, cologne, or perfumes.   Put on clean/comfortable clothes.  Brush your teeth.  Ask your nurse before applying any prescription medications to the skin.      CHG Compatible Lotions   Aveeno Moisturizing lotion  Cetaphil Moisturizing Cream  Cetaphil Moisturizing Lotion  Clairol Herbal Essence Moisturizing Lotion, Dry Skin  Clairol Herbal Essence Moisturizing Lotion, Extra Dry Skin  Clairol Herbal Essence Moisturizing Lotion, Normal Skin  Curel Age Defying Therapeutic Moisturizing Lotion with Alpha Hydroxy  Curel Extreme Care Body Lotion  Curel Soothing Hands Moisturizing Hand Lotion  Curel Therapeutic Moisturizing Cream, Fragrance-Free  Curel Therapeutic  Moisturizing Lotion, Fragrance-Free  Curel Therapeutic Moisturizing Lotion, Original Formula  Eucerin Daily Replenishing Lotion  Eucerin Dry Skin Therapy Plus Alpha Hydroxy Crme  Eucerin Dry Skin Therapy Plus Alpha Hydroxy Lotion  Eucerin Original Crme  Eucerin Original Lotion  Eucerin Plus Crme Eucerin Plus Lotion  Eucerin TriLipid Replenishing Lotion  Keri Anti-Bacterial Hand Lotion  Keri Deep Conditioning Original Lotion Dry Skin Formula Softly Scented  Keri Deep Conditioning Original Lotion, Fragrance Free Sensitive Skin Formula  Keri Lotion Fast Absorbing Fragrance Free Sensitive Skin Formula  Keri Lotion Fast Absorbing Softly Scented Dry Skin Formula  Keri Original Lotion  Keri Skin Renewal Lotion Keri Silky Smooth Lotion  Keri Silky Smooth Sensitive Skin Lotion  Nivea Body Creamy Conditioning Oil  Nivea Body Extra Enriched Lotion  Nivea Body Original Lotion  Nivea Body Sheer Moisturizing Lotion Nivea Crme  Nivea Skin Firming Lotion  NutraDerm 30 Skin Lotion  NutraDerm Skin Lotion  NutraDerm Therapeutic Skin Cream  NutraDerm Therapeutic Skin Lotion  ProShield Protective Hand Cream  Provon moisturizing lotion   FAILURE TO FOLLOW THESE INSTRUCTIONS MAY RESULT IN THE CANCELLATION OF YOUR SURGERY  PATIENT SIGNATURE_________________________________  NURSE SIGNATURE__________________________________  ________________________________________________________________________       Rogelia Mire    An incentive spirometer is a tool that can help keep your lungs clear and active. This tool measures how well you are filling your lungs with each breath. Taking  long deep breaths may help reverse or decrease the chance of developing breathing (pulmonary) problems (especially infection) following: A long period of time when you are unable to move or be active. BEFORE THE PROCEDURE  If the spirometer includes an indicator to show your best effort, your nurse or  respiratory therapist will set it to a desired goal. If possible, sit up straight or lean slightly forward. Try not to slouch. Hold the incentive spirometer in an upright position. INSTRUCTIONS FOR USE  Sit on the edge of your bed if possible, or sit up as far as you can in bed or on a chair. Hold the incentive spirometer in an upright position. Breathe out normally. Place the mouthpiece in your mouth and seal your lips tightly around it. Breathe in slowly and as deeply as possible, raising the piston or the ball toward the top of the column. Hold your breath for 3-5 seconds or for as long as possible. Allow the piston or ball to fall to the bottom of the column. Remove the mouthpiece from your mouth and breathe out normally. Rest for a few seconds and repeat Steps 1 through 7 at least 10 times every 1-2 hours when you are awake. Take your time and take a few normal breaths between deep breaths. The spirometer may include an indicator to show your best effort. Use the indicator as a goal to work toward during each repetition. After each set of 10 deep breaths, practice coughing to be sure your lungs are clear. If you have an incision (the cut made at the time of surgery), support your incision when coughing by placing a pillow or rolled up towels firmly against it. Once you are able to get out of bed, walk around indoors and cough well. You may stop using the incentive spirometer when instructed by your caregiver.  RISKS AND COMPLICATIONS Take your time so you do not get dizzy or light-headed. If you are in pain, you may need to take or ask for pain medication before doing incentive spirometry. It is harder to take a deep breath if you are having pain. AFTER USE Rest and breathe slowly and easily. It can be helpful to keep track of a log of your progress. Your caregiver can provide you with a simple table to help with this. If you are using the spirometer at home, follow these  instructions: SEEK MEDICAL CARE IF:  You are having difficultly using the spirometer. You have trouble using the spirometer as often as instructed. Your pain medication is not giving enough relief while using the spirometer. You develop fever of 100.5 F (38.1 C) or higher.                                                                                                    SEEK IMMEDIATE MEDICAL CARE IF:  You cough up bloody sputum that had not been present before. You develop fever of 102 F (38.9 C) or greater. You develop worsening pain at or near the incision site. MAKE SURE YOU:  Understand these instructions. Will watch your condition.  Will get help right away if you are not doing well or get worse. Document Released: 04/20/2007 Document Revised: 03/01/2012 Document Reviewed: 06/21/2007 [facility] Patient Information 2014 [place], [place].      If you would like to see a video about joint replacement:   [email or web address]

## 2024-03-01 NOTE — Progress Notes (Signed)
 COVID Vaccine received:  []  No [x]  Yes Date of any COVID positive Test in last 90 days:  PCP - Berniece Andreas, MD  medical clearance scanned to Media and in 01-19-24 Epic note Cardiologist - Olga Millers MD (seen 2003 for PVC workup only)  Chest x-ray - 08-24-2020  2v  CEW EKG -  11-28-2010  Epic  will repeat Stress Test -  ECHO -  Cardiac Cath -   PCR screen: [x]  Ordered & Completed []   No Order but Needs PROFEND     []   N/A for this surgery  Surgery Plan:  [x]  Ambulatory   []  Outpatient in bed  []  Admit Anesthesia:    []  General  [x]  Spinal  []   Choice []   MAC  Pacemaker / ICD device [x]  No []  Yes   Spinal Cord Stimulator:[x]  No []  Yes       History of Sleep Apnea? [x]  No []  Yes   CPAP used?- [x]  No []  Yes    Does the patient monitor blood sugar?   [x]  N/A   []  No []  Yes  Patient has: [x]  NO Hx DM   []  Pre-DM   []  DM1  []   DM2 Last A1c was:  6.0 normal on 01-27-2024      Blood Thinner / Instructions:  none Aspirin Instructions:  none  ERAS Protocol Ordered: []  No  [x]  Yes PRE-SURGERY [x]  ENSURE  []  G2  Patient is to be NPO after: 06:30  Dental hx: []  Dentures:  []  N/A      []  Bridge or Partial:                   []  Loose or Damaged teeth:   Comments: Patient was given the 5 CHG shower / bath instructions for THA surgery along with 2 bottles of the CHG soap. Patient will start this on: 03-11-24 All questions were asked and answered, Patient voiced understanding of this process.   Activity level: Patient is able / unable to climb a flight of stairs without difficulty; []  No CP  []  No SOB, but would have ___   Patient can / can not perform ADLs without assistance.   Anesthesia review: bradycardia, remote PVCs (worked up by Dr. Jens Som 2003) macular degeneration, HTN  Patient denies shortness of breath, fever, cough and chest pain at PAT appointment.  Patient verbalized understanding and agreement to the Pre-Surgical Instructions that were given to them at this PAT  appointment. Patient was also educated of the need to review these PAT instructions again prior to her surgery.I reviewed the appropriate phone numbers to call if they have any and questions or concerns.

## 2024-03-02 ENCOUNTER — Encounter (HOSPITAL_COMMUNITY)
Admission: RE | Admit: 2024-03-02 | Discharge: 2024-03-02 | Disposition: A | Discharge status: Home / Self Care | Payer: Medicare Other | Source: Ambulatory Visit | Attending: Orthopedic Surgery | Admitting: Orthopedic Surgery

## 2024-03-02 ENCOUNTER — Other Ambulatory Visit: Payer: Self-pay

## 2024-03-02 ENCOUNTER — Encounter (HOSPITAL_COMMUNITY): Payer: Self-pay

## 2024-03-02 VITALS — BP 117/62 | HR 58 | Temp 98.1°F | Resp 12 | Ht 60.0 in | Wt 102.0 lb

## 2024-03-02 DIAGNOSIS — R001 Bradycardia, unspecified: Secondary | ICD-10-CM | POA: Insufficient documentation

## 2024-03-02 DIAGNOSIS — Z01818 Encounter for other preprocedural examination: Secondary | ICD-10-CM | POA: Diagnosis not present

## 2024-03-02 DIAGNOSIS — I495 Sick sinus syndrome: Secondary | ICD-10-CM | POA: Diagnosis not present

## 2024-03-02 DIAGNOSIS — I10 Essential (primary) hypertension: Secondary | ICD-10-CM | POA: Diagnosis not present

## 2024-03-02 HISTORY — DX: Exudative age-related macular degeneration, unspecified eye, stage unspecified: H35.3290

## 2024-03-02 HISTORY — DX: Cardiac arrhythmia, unspecified: I49.9

## 2024-03-02 HISTORY — DX: Other specified postprocedural states: Z98.890

## 2024-03-02 HISTORY — DX: Pneumonia, unspecified organism: J18.9

## 2024-03-02 LAB — CBC
HCT: 44 % (ref 36.0–46.0)
Hemoglobin: 14.1 g/dL (ref 12.0–15.0)
MCH: 32.4 pg (ref 26.0–34.0)
MCHC: 32 g/dL (ref 30.0–36.0)
MCV: 101.1 fL — ABNORMAL HIGH (ref 80.0–100.0)
Platelets: 330 10*3/uL (ref 150–400)
RBC: 4.35 MIL/uL (ref 3.87–5.11)
RDW: 13.6 % (ref 11.5–15.5)
WBC: 9.4 10*3/uL (ref 4.0–10.5)
nRBC: 0 % (ref 0.0–0.2)

## 2024-03-02 LAB — TYPE AND SCREEN
ABO/RH(D): O POS
Antibody Screen: NEGATIVE

## 2024-03-02 LAB — BASIC METABOLIC PANEL
Anion gap: 9 (ref 5–15)
BUN: 20 mg/dL (ref 8–23)
CO2: 26 mmol/L (ref 22–32)
Calcium: 8.8 mg/dL — ABNORMAL LOW (ref 8.9–10.3)
Chloride: 103 mmol/L (ref 98–111)
Creatinine, Ser: 0.7 mg/dL (ref 0.44–1.00)
GFR, Estimated: >60 mL/min (ref >60)
Glucose, Bld: 97 mg/dL (ref 70–99)
Potassium: 3.8 mmol/L (ref 3.5–5.1)
Sodium: 138 mmol/L (ref 135–145)

## 2024-03-02 LAB — SURGICAL PCR SCREEN
MRSA, PCR: NEGATIVE
Staphylococcus aureus: NEGATIVE

## 2024-03-11 DIAGNOSIS — H353221 Exudative age-related macular degeneration, left eye, with active choroidal neovascularization: Secondary | ICD-10-CM | POA: Diagnosis not present

## 2024-03-11 DIAGNOSIS — H43813 Vitreous degeneration, bilateral: Secondary | ICD-10-CM | POA: Diagnosis not present

## 2024-03-11 DIAGNOSIS — H35371 Puckering of macula, right eye: Secondary | ICD-10-CM | POA: Diagnosis not present

## 2024-03-11 DIAGNOSIS — H353112 Nonexudative age-related macular degeneration, right eye, intermediate dry stage: Secondary | ICD-10-CM | POA: Diagnosis not present

## 2024-03-11 DIAGNOSIS — H43821 Vitreomacular adhesion, right eye: Secondary | ICD-10-CM | POA: Diagnosis not present

## 2024-03-14 ENCOUNTER — Other Ambulatory Visit: Payer: Self-pay | Admitting: Family

## 2024-03-14 ENCOUNTER — Other Ambulatory Visit: Payer: Self-pay | Admitting: Internal Medicine

## 2024-03-14 NOTE — H&P (Signed)
 HIP ARTHROPLASTY ADMISSION H&P  Patient ID: Glendoris Nodarse MRN: [id number] DOB/AGE: 1941/09/25 83 y.o.  Chief Complaint: left hip pain.  Planned Procedure Date: 03/15/24 Medical Clearance by Dr. Fabian Sharp     HPI: Jenalyn Girdner is a 83 y.o. female who presents for evaluation of left hip OA. The patient has a history of pain and functional disability in the left hip due to arthritis and has failed non-surgical conservative treatments for greater than 12 weeks to include corticosteriod injections and activity modification.  Onset of symptoms was gradual, starting 5 years ago with rapidlly worsening course since that time. The patient noted no past surgery on the left hip.  Patient currently rates pain at 4 out of 10 with activity. Patient has worsening of pain with activity and weight bearing and pain that interferes with activities of daily living.  Patient has evidence of joint space narrowing by imaging studies.  There is no active infection.  Past Medical History:  Diagnosis Date   COLONIC POLYPS, HX OF 08/30/2007   Qualifier: Diagnosis of  By: Claiborne Billings CMA, Jacqualynn     Dysrhythmia    PVCs   Dysrhythmia    Bradycardia   Hx of colonic polyps    Hypertension    Hypothyroidism    Macular degeneration, wet (HCC)    left eye only   OSTEOARTHRITIS, HAND 04/24/2009   Qualifier: Diagnosis of  By: Fabian Sharp MD, Neta Mends    Pneumonia    PONV (postoperative nausea and vomiting)    PVC (premature ventricular contraction)    [facility] evaluation 7/03   Seasonal allergies    Skin cancer    hx of bcca of face, legs   SKIN CANCER, HX OF 02/02/2008   Qualifier: Diagnosis of  By: Fabian Sharp MD, Neta Mends    Past Surgical History:  Procedure Laterality Date   EYE SURGERY Bilateral    FACIAL COSMETIC SURGERY Bilateral 2001   Face lift   KNEE CARTILAGE SURGERY Right    meniscus repair   SHOULDER ARTHROSCOPY W/ ROTATOR CUFF REPAIR Right 2005   TONSILLECTOMY     tubal lig     Allergies   Allergen Reactions   Amoxicillin-Pot Clavulanate     REACTION: colitis  after ceclor  adn augmentin years ago   Cefdinir     REACTION: "colitis" diarrhea   Prior to Admission medications   Medication Sig Start Date End Date Taking? Authorizing Provider  alum hydroxide-mag trisilicate (GAVISCON) 80-20 MG CHEW chewable tablet Chew 1 tablet by mouth at bedtime.   Yes [provider]  Calcium-Vitamin D-Vitamin K (VIACTIV PO) Take 1 tablet by mouth at bedtime.   Yes [provider]  Cholecalciferol (VITAMIN D3) 2000 units TABS Take 2,000 Units by mouth daily.   Yes [provider]  estradiol (ESTRACE) 0.1 MG/GM vaginal cream 1 -2 gram intravaginally 2  x per week 02/13/20  Yes Panosh, Neta Mends, MD  magnesium gluconate (MAGONATE) 500 MG tablet Take 500 mg by mouth daily.   Yes [provider]  Multiple Vitamins-Minerals (PRESERVISION AREDS 2 PO) Take 1 tablet by mouth in the morning and at bedtime.   Yes [provider]  Propylene Glycol (SYSTANE BALANCE OP) Place 1 drop into both eyes in the morning and at bedtime.   Yes [provider]  simvastatin (ZOCOR) 20 MG tablet TAKE 1 TABLET(20 MG) BY MOUTH AT BEDTIME 03/29/23  Yes Worthy Rancher B, FNP  SYNTHROID 100 MCG tablet TAKE 1 TABLET (100 MCG  TOTAL) DAILY BEFORE BREAKFAST Patient taking differently: Take 100 mcg by mouth See admin instructions. Take 100 mcg on Monday, Tuesday, Thursday, Friday and Saturday. 10/21/23  Yes Panosh, Neta Mends, MD   Social History   Socioeconomic History   Marital status: Married    Spouse name: Not on file   Number of children: Not on file   Years of education: Not on file   Highest education level: Not on file  Occupational History   Not on file  Tobacco Use   Smoking status: Former   Smokeless tobacco: Never  Vaping Use   Vaping status: Never Used  Substance and Sexual Activity   Alcohol use: Yes    Comment: rarely   Drug use: No   Sexual activity: Not  Currently  Other Topics Concern   Not on file  Social History Narrative   Married    Former smoker   Games developer and teaches tap dancing.    No etoh   HH of 2    Social Drivers of Corporate investment banker Strain: Low Risk  (02/03/2024)   Overall Financial Resource Strain (CARDIA)    Difficulty of Paying Living Expenses: Not hard at all  Food Insecurity: No Food Insecurity (02/03/2024)   Hunger Vital Sign    Worried About Running Out of Food in the Last Year: Never true    Ran Out of Food in the Last Year: Never true  Transportation Needs: No Transportation Needs (10/28/2022)   PRAPARE - Administrator, Civil Service (Medical): No    Lack of Transportation (Non-Medical): No  Physical Activity: Insufficiently Active (10/28/2022)   Exercise Vital Sign    Days of Exercise per Week: 1 day    Minutes of Exercise per Session: 60 min  Stress: No Stress Concern Present (02/03/2024)   [employer] of Occupational Health - Occupational Stress Questionnaire    Feeling of Stress : Only a little  Social Connections: Socially Integrated (02/03/2024)   Social Connection and Isolation Panel [NHANES]    Frequency of Communication with Friends and Family: More than three times a week    Frequency of Social Gatherings with Friends and Family: Once a week    Attends Religious Services: More than 4 times per year    Active Member of [employer] or Organizations: Yes    Attends Engineer, structural: More than 4 times per year    Marital Status: Married   Family History  Problem Relation Age of Onset   Cancer Other        colon, prostate    Diabetes Other    Stroke Other    Heart disease Other    Colon cancer Mother     ROS: Currently denies lightheadedness, dizziness, Fever, chills, CP, SOB.   No personal history of DVT, PE, MI, or CVA. No loose teeth or dentures All other systems have been reviewed and were otherwise currently negative with the exception of those  mentioned in the HPI and as above.  Objective: Vitals: Ht: 5\' 1"  Wt: 105.6 lbs Temp: 97.9 BP: 154/78 Pulse: 58 O2 97% on room air.   Physical Exam: General: Alert, NAD. Trendelenberg Gait  HEENT: EOMI, Good Neck Extension  Pulm: No increased work of breathing.  Clear B/L A/P w/o crackle or wheeze.  CV: RRR, No m/g/r appreciated  GI: soft, NT, ND Neuro: Neuro without gross focal deficit.  Sensation intact distally Skin: No lesions in the area of chief complaint MSK/Surgical  Site: She has a full range of motion at the left hip. Mild to moderate pain elicited with passive internal and external rotation. Dorsiflexion and plantarflexion intact at the ankle. There is a 2+ PT pulse. Distal sensation intact.    Imaging Review Plain radiographs demonstrate severe degenerative joint disease of the left hip.   The bone quality appears to be adequate for age and reported activity level.  Preoperative templating of the joint replacement has been completed, documented, and submitted to the Operating Room personnel in order to optimize intra-operative equipment management.  Assessment: left hip OA    Plan: Plan for Procedure(s): ARTHROPLASTY, HIP, TOTAL,POSTERIOR APPROACH  The patient history, physical exam, clinical judgement of the provider and imaging are consistent with end stage degenerative joint disease and total joint arthroplasty is deemed medically necessary. The treatment options including medical management, injection therapy, and arthroplasty were discussed at length. The risks and benefits of Procedure(s): ARTHROPLASTY, HIP, TOTAL,POSTERIOR APPROACH were presented and reviewed.  The risks of nonoperative treatment, versus surgical intervention including but not limited to continued pain, aseptic loosening, stiffness, dislocation/subluxation, infection, bleeding, nerve injury, blood clots, cardiopulmonary complications, morbidity, mortality, among others were discussed. The patient  verbalizes understanding and wishes to proceed with the plan.  Patient is being admitted for surgery, pain control, PT, prophylactic antibiotics, VTE prophylaxis, progressive ambulation, ADL's and discharge planning.   The patient does meet the criteria for TXA which will be used perioperatively.   ASA 325 mg  will be used postoperatively for DVT prophylaxis in addition to SCDs, and early ambulation. The patient is planning to be discharged home with OPPT in care of husband   Annita Brod 03/14/2024 2:33 PM

## 2024-03-14 NOTE — Telephone Encounter (Signed)
 Copied from CRM [id number]. Topic: Clinical - Medication Refill >> Mar 14, 2024  2:32 PM Isabell A wrote: Most Recent Primary Care Visit:  Provider: Berniece Andreas K  Department: [facility]  Visit Type: PHYSICAL  Date: 02/03/2024  Medication: simvastatin (ZOCOR) 20 MG tablet  Has the patient contacted their pharmacy? Yes (Agent: If no, request that the patient contact the pharmacy for the refill. If patient does not wish to contact the pharmacy document the reason why and proceed with request.) (Agent: If yes, when and what did the pharmacy advise?)  Is this the correct pharmacy for this prescription? Yes If no, delete pharmacy and type the correct one.  This is the patient's preferred pharmacy:  [id number] [place], [place] - [address] AT [facility] [address] [place] [address] Phone: [phone] Fax: [phone]   Has the prescription been filled recently? Yes  Is the patient out of the medication? No  Has the patient been seen for an appointment in the last year OR does the patient have an upcoming appointment? Yes  Can we respond through MyChart? No  Agent: Please be advised that Rx refills may take up to 3 business days. We ask that you follow-up with your pharmacy.

## 2024-03-15 ENCOUNTER — Ambulatory Visit (HOSPITAL_COMMUNITY): Payer: Self-pay | Admitting: Certified Registered"

## 2024-03-15 ENCOUNTER — Ambulatory Visit (HOSPITAL_COMMUNITY)

## 2024-03-15 ENCOUNTER — Ambulatory Visit (HOSPITAL_BASED_OUTPATIENT_CLINIC_OR_DEPARTMENT_OTHER): Payer: Self-pay | Admitting: Certified Registered"

## 2024-03-15 ENCOUNTER — Other Ambulatory Visit: Payer: Self-pay

## 2024-03-15 ENCOUNTER — Encounter (HOSPITAL_COMMUNITY)
Admission: RE | Disposition: A | Discharge status: Home / Self Care | Payer: Self-pay | Source: Ambulatory Visit | Attending: Orthopedic Surgery

## 2024-03-15 ENCOUNTER — Encounter (HOSPITAL_COMMUNITY): Payer: Self-pay | Admitting: Orthopedic Surgery

## 2024-03-15 ENCOUNTER — Ambulatory Visit (HOSPITAL_COMMUNITY)
Admission: RE | Admit: 2024-03-15 | Discharge: 2024-03-15 | Disposition: A | Discharge status: Home / Self Care | Payer: Medicare Other | Source: Ambulatory Visit | Attending: Orthopedic Surgery | Admitting: Orthopedic Surgery

## 2024-03-15 DIAGNOSIS — M1612 Unilateral primary osteoarthritis, left hip: Secondary | ICD-10-CM | POA: Insufficient documentation

## 2024-03-15 DIAGNOSIS — Z471 Aftercare following joint replacement surgery: Secondary | ICD-10-CM | POA: Diagnosis not present

## 2024-03-15 DIAGNOSIS — Z87891 Personal history of nicotine dependence: Secondary | ICD-10-CM | POA: Diagnosis not present

## 2024-03-15 DIAGNOSIS — E039 Hypothyroidism, unspecified: Secondary | ICD-10-CM | POA: Diagnosis not present

## 2024-03-15 DIAGNOSIS — I10 Essential (primary) hypertension: Secondary | ICD-10-CM | POA: Insufficient documentation

## 2024-03-15 DIAGNOSIS — Z96642 Presence of left artificial hip joint: Secondary | ICD-10-CM | POA: Diagnosis not present

## 2024-03-15 HISTORY — PX: TOTAL HIP ARTHROPLASTY: SHX124

## 2024-03-15 LAB — ABO/RH: ABO/RH(D): O POS

## 2024-03-15 SURGERY — ARTHROPLASTY, HIP, TOTAL,POSTERIOR APPROACH
Anesthesia: Spinal | Site: Hip | Laterality: Left

## 2024-03-15 MED ORDER — 0.9 % SODIUM CHLORIDE (POUR BTL) OPTIME
TOPICAL | Status: DC | PRN
  Administered 2024-03-15: 1000 mL

## 2024-03-15 MED ORDER — POVIDONE-IODINE 10 % EX SWAB: 2.0000 | Freq: Once | CUTANEOUS | Status: DC

## 2024-03-15 MED ORDER — EPHEDRINE SULFATE-NACL 50-0.9 MG/10ML-% IV SOSY
PREFILLED_SYRINGE | INTRAVENOUS | Status: DC | PRN
  Administered 2024-03-15 (×5): 5 mg via INTRAVENOUS

## 2024-03-15 MED ORDER — LACTATED RINGERS IV BOLUS
500.0000 mL | Freq: Once | INTRAVENOUS | Status: AC
  Administered 2024-03-15: 500 mL via INTRAVENOUS

## 2024-03-15 MED ORDER — ASPIRIN 325 MG PO TBEC: 325.0000 mg | DELAYED_RELEASE_TABLET | Freq: Two times a day (BID) | ORAL | 0 refills | Status: DC

## 2024-03-15 MED ORDER — CHLORHEXIDINE GLUCONATE 0.12 % MT SOLN
15.0000 mL | Freq: Once | OROMUCOSAL | Status: AC
  Administered 2024-03-15: 15 mL via OROMUCOSAL

## 2024-03-15 MED ORDER — PROPOFOL 500 MG/50ML IV EMUL
INTRAVENOUS | Status: DC | PRN
  Administered 2024-03-15: 60 ug/kg/min via INTRAVENOUS

## 2024-03-15 MED ORDER — PROPOFOL 10 MG/ML IV BOLUS
INTRAVENOUS | Status: AC
  Filled 2024-03-15: qty 20

## 2024-03-15 MED ORDER — DEXAMETHASONE SODIUM PHOSPHATE 10 MG/ML IJ SOLN
INTRAMUSCULAR | Status: AC
  Filled 2024-03-15: qty 1

## 2024-03-15 MED ORDER — STERILE WATER FOR IRRIGATION IR SOLN
Status: DC | PRN
  Administered 2024-03-15: 1000 mL

## 2024-03-15 MED ORDER — LIDOCAINE HCL (PF) 2 % IJ SOLN
INTRAMUSCULAR | Status: DC | PRN
  Administered 2024-03-15: 40 mg via INTRADERMAL

## 2024-03-15 MED ORDER — TRANEXAMIC ACID-NACL 1000-0.7 MG/100ML-% IV SOLN
1000.0000 mg | INTRAVENOUS | Status: AC
  Administered 2024-03-15: 1000 mg via INTRAVENOUS
  Filled 2024-03-15: qty 100

## 2024-03-15 MED ORDER — SIMVASTATIN 20 MG PO TABS
ORAL_TABLET | ORAL | 3 refills | Status: AC
Start: 2024-03-15 — End: ?

## 2024-03-15 MED ORDER — BUPIVACAINE HCL (PF) 0.25 % IJ SOLN
INTRAMUSCULAR | Status: AC
  Filled 2024-03-15: qty 30

## 2024-03-15 MED ORDER — BUPIVACAINE-EPINEPHRINE (PF) 0.25% -1:200000 IJ SOLN
INTRAMUSCULAR | Status: AC
Start: 2024-03-15 — End: ?
  Filled 2024-03-15: qty 30

## 2024-03-15 MED ORDER — BUPIVACAINE IN DEXTROSE 0.75-8.25 % IT SOLN
INTRATHECAL | Status: DC | PRN
  Administered 2024-03-15: 1.8 mL via INTRATHECAL

## 2024-03-15 MED ORDER — TRANEXAMIC ACID-NACL 1000-0.7 MG/100ML-% IV SOLN
INTRAVENOUS | Status: AC
  Filled 2024-03-15: qty 100

## 2024-03-15 MED ORDER — ORAL CARE MOUTH RINSE: 15.0000 mL | Freq: Once | OROMUCOSAL | Status: AC

## 2024-03-15 MED ORDER — LACTATED RINGERS IV BOLUS
250.0000 mL | Freq: Once | INTRAVENOUS | Status: AC
  Administered 2024-03-15: 250 mL via INTRAVENOUS

## 2024-03-15 MED ORDER — ACETAMINOPHEN 500 MG PO TABS
1000.0000 mg | ORAL_TABLET | Freq: Once | ORAL | Status: AC
  Administered 2024-03-15: 1000 mg via ORAL
  Filled 2024-03-15: qty 2

## 2024-03-15 MED ORDER — FENTANYL CITRATE (PF) 100 MCG/2ML IJ SOLN
INTRAMUSCULAR | Status: AC
  Filled 2024-03-15: qty 2

## 2024-03-15 MED ORDER — LIDOCAINE HCL (PF) 2 % IJ SOLN
INTRAMUSCULAR | Status: AC
  Filled 2024-03-15: qty 5

## 2024-03-15 MED ORDER — CEFAZOLIN SODIUM-DEXTROSE 2-4 GM/100ML-% IV SOLN
INTRAVENOUS | Status: AC
  Filled 2024-03-15: qty 100

## 2024-03-15 MED ORDER — CEFAZOLIN SODIUM-DEXTROSE 2-4 GM/100ML-% IV SOLN
2.0000 g | INTRAVENOUS | Status: AC
  Administered 2024-03-15: 2 g via INTRAVENOUS
  Filled 2024-03-15: qty 100

## 2024-03-15 MED ORDER — LACTATED RINGERS IV SOLN: INTRAVENOUS | Status: DC

## 2024-03-15 MED ORDER — KETOROLAC TROMETHAMINE 30 MG/ML IJ SOLN: INTRAMUSCULAR | Status: DC | PRN

## 2024-03-15 MED ORDER — PHENYLEPHRINE HCL-NACL 20-0.9 MG/250ML-% IV SOLN
INTRAVENOUS | Status: DC | PRN
  Administered 2024-03-15: 10 ug/min via INTRAVENOUS

## 2024-03-15 MED ORDER — PROPOFOL 1000 MG/100ML IV EMUL
INTRAVENOUS | Status: AC
  Filled 2024-03-15: qty 100

## 2024-03-15 MED ORDER — ONDANSETRON HCL 4 MG/2ML IJ SOLN
INTRAMUSCULAR | Status: AC
  Filled 2024-03-15: qty 2

## 2024-03-15 MED ORDER — ONDANSETRON HCL 4 MG PO TABS: 4.0000 mg | ORAL_TABLET | Freq: Three times a day (TID) | ORAL | 0 refills | Status: DC | PRN

## 2024-03-15 MED ORDER — ONDANSETRON HCL 4 MG/2ML IJ SOLN
INTRAMUSCULAR | Status: DC | PRN
  Administered 2024-03-15: 4 mg via INTRAVENOUS

## 2024-03-15 MED ORDER — CEFAZOLIN SODIUM-DEXTROSE 2-4 GM/100ML-% IV SOLN
2.0000 g | Freq: Four times a day (QID) | INTRAVENOUS | Status: DC
  Administered 2024-03-15: 2 g via INTRAVENOUS

## 2024-03-15 MED ORDER — PROPOFOL 10 MG/ML IV BOLUS
INTRAVENOUS | Status: DC | PRN
Start: 2024-03-15 — End: 2024-03-15
  Administered 2024-03-15: 20 mg via INTRAVENOUS
  Administered 2024-03-15: 10 mg via INTRAVENOUS

## 2024-03-15 MED ORDER — KETOROLAC TROMETHAMINE 30 MG/ML IJ SOLN
INTRAMUSCULAR | Status: AC
  Filled 2024-03-15: qty 1

## 2024-03-15 MED ORDER — TRANEXAMIC ACID-NACL 1000-0.7 MG/100ML-% IV SOLN
1000.0000 mg | Freq: Once | INTRAVENOUS | Status: AC
  Administered 2024-03-15: 1000 mg via INTRAVENOUS

## 2024-03-15 MED ORDER — FENTANYL CITRATE (PF) 100 MCG/2ML IJ SOLN
INTRAMUSCULAR | Status: DC | PRN
  Administered 2024-03-15: 25 ug via INTRAVENOUS

## 2024-03-15 MED ORDER — POVIDONE-IODINE 7.5 % EX SOLN: Freq: Once | CUTANEOUS | Status: DC

## 2024-03-15 MED ORDER — OXYCODONE HCL 5 MG PO TABS: 5.0000 mg | ORAL_TABLET | ORAL | 0 refills | Status: DC | PRN

## 2024-03-15 MED ORDER — DEXAMETHASONE SODIUM PHOSPHATE 10 MG/ML IJ SOLN
INTRAMUSCULAR | Status: DC | PRN
  Administered 2024-03-15: 4 mg via INTRAVENOUS

## 2024-03-15 SURGICAL SUPPLY — 51 items
BAG COUNTER SPONGE SURGICOUNT (BAG) IMPLANT
BIT DRILL 2.0X128 (BIT) ×1 IMPLANT
BLADE SAW SGTL 73X25 THK (BLADE) ×1 IMPLANT
CLSR STERI-STRIP ANTIMIC 1/2X4 (GAUZE/BANDAGES/DRESSINGS) ×2 IMPLANT
COVER SURGICAL LIGHT HANDLE (MISCELLANEOUS) ×1 IMPLANT
CUP ACET PINNACLE SECTR 48MM (Joint) IMPLANT
DRAPE INCISE IOBAN 66X45 STRL (DRAPES) ×1 IMPLANT
DRAPE POUCH INSTRU U-SHP 10X18 (DRAPES) ×1 IMPLANT
DRAPE SHEET LG 3/4 BI-LAMINATE (DRAPES) ×1 IMPLANT
DRAPE SURG 17X11 SM STRL (DRAPES) ×1 IMPLANT
DRAPE SURG ORHT 6 SPLT 77X108 (DRAPES) ×2 IMPLANT
DRAPE U-SHAPE 47X51 STRL (DRAPES) ×1 IMPLANT
DRSG MEPILEX POST OP 4X12 (GAUZE/BANDAGES/DRESSINGS) IMPLANT
DRSG MEPILEX POST OP 4X8 (GAUZE/BANDAGES/DRESSINGS) ×1 IMPLANT
DURAPREP 26ML APPLICATOR (WOUND CARE) ×2 IMPLANT
ELECT BLADE TIP CTD 4 INCH (ELECTRODE) ×1 IMPLANT
ELECT REM PT RETURN 15FT ADLT (MISCELLANEOUS) ×1 IMPLANT
ELIMINATOR HOLE APEX DEPUY (Hips) IMPLANT
FACESHIELD WRAPAROUND (MASK) ×1 IMPLANT
FACESHIELD WRAPAROUND OR TEAM (MASK) ×2 IMPLANT
GLOVE BIO SURGEON STRL SZ 6.5 (GLOVE) ×1 IMPLANT
GLOVE BIO SURGEON STRL SZ7.5 (GLOVE) ×1 IMPLANT
GLOVE BIOGEL PI IND STRL 7.0 (GLOVE) ×1 IMPLANT
GLOVE BIOGEL PI IND STRL 8 (GLOVE) ×1 IMPLANT
GOWN STRL SURGICAL XL XLNG (GOWN DISPOSABLE) ×2 IMPLANT
HEAD FEM STD 32X+1 STRL (Hips) IMPLANT
HOOD PEEL AWAY T7 (MISCELLANEOUS) ×3 IMPLANT
KIT BASIN OR (CUSTOM PROCEDURE TRAY) ×1 IMPLANT
KIT TURNOVER KIT A (KITS) IMPLANT
MANIFOLD NEPTUNE II (INSTRUMENTS) ×1 IMPLANT
NDL SAFETY ECLIPSE 18X1.5 (NEEDLE) ×2 IMPLANT
NEEDLE ANCHOR KEITH 2 7/8 STR (NEEDLE) ×1 IMPLANT
NS IRRIG 1000ML POUR BTL (IV SOLUTION) ×1 IMPLANT
PACK TOTAL JOINT (CUSTOM PROCEDURE TRAY) ×1 IMPLANT
PINN ALTRX NEUT ID X OD 32X48 IMPLANT
PINNSECTOR W/GRIP ACE CUP 48MM (Joint) ×1 IMPLANT
PROTECTOR NERVE ULNAR (MISCELLANEOUS) ×1 IMPLANT
SCREW 6.5MMX25MM (Screw) IMPLANT
STEM FEMORAL SZ 6MM STD ACTIS (Stem) IMPLANT
SUCTION TUBE FRAZIER 12FR DISP (SUCTIONS) ×1 IMPLANT
SUT ETHIBOND NAB CT1 #1 30IN (SUTURE) ×3 IMPLANT
SUT STRATAFIX PDS+ 0 24IN (SUTURE) IMPLANT
SUT VIC AB 1 CT1 36 (SUTURE) ×3 IMPLANT
SUT VIC AB 2-0 CT1 TAPERPNT 27 (SUTURE) ×2 IMPLANT
SUT VIC AB 3-0 SH 27X BRD (SUTURE) ×2 IMPLANT
SYR CONTROL 10ML LL (SYRINGE) ×2 IMPLANT
TOWEL OR 17X26 10 PK STRL BLUE (TOWEL DISPOSABLE) ×1 IMPLANT
TRAY FOLEY MTR SLVR 14FR STAT (SET/KITS/TRAYS/PACK) IMPLANT
TRAY FOLEY MTR SLVR 16FR STAT (SET/KITS/TRAYS/PACK) ×1 IMPLANT
TUBE SUCTION HIGH CAP CLEAR NV (SUCTIONS) ×1 IMPLANT
WATER STERILE IRR 1000ML POUR (IV SOLUTION) ×2 IMPLANT

## 2024-03-15 NOTE — Anesthesia Postprocedure Evaluation (Signed)
 Anesthesia Post Note  Patient: Christina Myers  Procedure(s) Performed: ARTHROPLASTY, HIP, TOTAL,POSTERIOR APPROACH (Left: Hip)     Patient location during evaluation: PACU Anesthesia Type: Spinal Level of consciousness: awake and alert Pain management: pain level controlled Vital Signs Assessment: post-procedure vital signs reviewed and stable Respiratory status: spontaneous breathing Cardiovascular status: stable Anesthetic complications: no   No notable events documented.  Last Vitals:  Vitals:   03/15/24 1149 03/15/24 1300  BP: 121/61   Pulse: 63 67  Resp:    Temp:    SpO2: 97% 98%    Last Pain:  Vitals:   03/15/24 1300  TempSrc:   PainSc: 0-No pain                 Lewie Loron

## 2024-03-15 NOTE — Anesthesia Procedure Notes (Signed)
 Spinal  Patient location during procedure: OR Start time: 03/15/2024 7:32 AM End time: 03/15/2024 7:36 AM Reason for block: surgical anesthesia Staffing Performed: anesthesiologist  Anesthesiologist: Lewie Loron, MD Performed by: Lewie Loron, MD Authorized by: Lewie Loron, MD   Preanesthetic Checklist Completed: patient identified, IV checked, site marked, risks and benefits discussed, surgical consent, monitors and equipment checked, pre-op evaluation and timeout performed Spinal Block Patient position: sitting Prep: DuraPrep and site prepped and draped Patient monitoring: heart rate, continuous pulse ox and blood pressure Approach: right paramedian Location: L2-3 Injection technique: single-shot Needle Needle type: Spinocan  Needle gauge: 25 G Needle length: 9 cm Additional Notes Expiration date of kit checked and confirmed. Patient tolerated procedure well, without complications.

## 2024-03-15 NOTE — Interval H&P Note (Signed)
 History and Physical Interval Note:  03/15/2024 7:13 AM  Christina Myers  has presented today for surgery, with the diagnosis of left hip OA.  The various methods of treatment have been discussed with the patient and family. After consideration of risks, benefits and other options for treatment, the patient has consented to  Procedure(s): ARTHROPLASTY, HIP, TOTAL,POSTERIOR APPROACH (Left) as a surgical intervention.  The patient's history has been reviewed, patient examined, no change in status, stable for surgery.  I have reviewed the patient's chart and labs.  Questions were answered to the patient's satisfaction.     Eulas Post

## 2024-03-15 NOTE — Discharge Instructions (Signed)

## 2024-03-15 NOTE — Evaluation (Signed)
 Physical Therapy Evaluation Patient Details Name: Christina Myers MRN: [id number] DOB: 1941-01-23 Today's Date: 03/15/2024  History of Present Illness  83 y.o. female presented 03/15/24 for L THA-posterior approach, no precautions. PMH: hypothyroid, R rotator cuff repair, PNA, HTN.  Clinical Impression  Pt is mobilizing well, she ambulated 42' + 10' with RW, no loss of balance. Stair training completed with pt's spouse present. Pt demonstrates good understanding of HEP. Pt ambulated to the bathroom but was unable to urinate, she reports a little bit of numbness in her buttocks. From a PT standpoint, she is ready to DC.         If plan is discharge home, recommend the following: A little help with bathing/dressing/bathroom;Assist for transportation;Help with stairs or ramp for entrance   Can travel by private vehicle        Equipment Recommendations Rolling walker (2 wheels) (youth)  Recommendations for Other Services       Functional Status Assessment Patient has had a recent decline in their functional status and demonstrates the ability to make significant improvements in function in a reasonable and predictable amount of time.     Precautions / Restrictions Precautions Precautions: Fall Recall of Precautions/Restrictions: Intact Restrictions Weight Bearing Restrictions Per Provider Order: No      Mobility  Bed Mobility Overal bed mobility: Modified Independent             General bed mobility comments: HOB up, used rail    Transfers Overall transfer level: Needs assistance Equipment used: Rolling walker (2 wheels) Transfers: Sit to/from Stand Sit to Stand: Contact guard assist           General transfer comment: VCs hand placement    Ambulation/Gait Ambulation/Gait assistance: Contact guard assist Gait Distance (Feet): 75 Feet Assistive device: Rolling walker (2 wheels) Gait Pattern/deviations: Step-to pattern, Narrow base of support, Decreased step  length - right, Decreased step length - left Gait velocity: decr     General Gait Details: 59' + 27' with seated rest, VCs sequencing and to widen BOS  Stairs Stairs: Yes Stairs assistance: Contact guard assist Stair Management: One rail Left, Step to pattern, Forwards, With cane Number of Stairs: 3 General stair comments: 3 steps forwards with L rail and cane; then 2 steps backwards x 2 reps with RW no rails; spouse present  Wheelchair Mobility     Tilt Bed    Modified Rankin (Stroke Patients Only)       Balance Overall balance assessment: Modified Independent                                           Pertinent Vitals/Pain Pain Assessment Pain Assessment: No/denies pain    Home Living Family/patient expects to be discharged to:: Private residence Living Arrangements: Spouse/significant other Available Help at Discharge: Family;Available 24 hours/day Type of Home: House Home Access: Stairs to enter Entrance Stairs-Rails: None Entrance Stairs-Number of Steps: 3 Alternate Level Stairs-Number of Steps: flight to bedroom, but can stay main level if needed Home Layout: Two level;Able to live on main level with bedroom/bathroom Home Equipment: Cane - single point      Prior Function Prior Level of Function : Independent/Modified Independent             Mobility Comments: walks without AD, no falls in past 6 months ADLs Comments: independent     Extremity/Trunk Assessment  Upper Extremity Assessment Upper Extremity Assessment: Overall WFL for tasks assessed    Lower Extremity Assessment Lower Extremity Assessment: LLE deficits/detail LLE Deficits / Details: knee ext 4/5, hip flexion AAROM ~45*, abduction AAROM ~15* LLE Sensation: WNL LLE Coordination: WNL    Cervical / Trunk Assessment Cervical / Trunk Assessment: Normal  Communication   Communication Communication: No apparent difficulties    Cognition Arousal: Alert Behavior  During Therapy: WFL for tasks assessed/performed   PT - Cognitive impairments: No apparent impairments                         Following commands: Intact       Cueing Cueing Techniques: Verbal cues     General Comments      Exercises Total Joint Exercises Ankle Circles/Pumps: AROM, Both, 10 reps, Supine Quad Sets: AROM, Both, 5 reps, Supine Short Arc Quad: AROM, Left, 5 reps, Supine Heel Slides: AAROM, Left, 5 reps, Supine Hip ABduction/ADduction: AAROM, Left, 5 reps, Supine Long Arc Quad: AROM, Left, 5 reps, Seated   Assessment/Plan    PT Assessment Patient does not need any further PT services  PT Problem List         PT Treatment Interventions      PT Goals (Current goals can be found in the Care Plan section)  Acute Rehab PT Goals PT Goal Formulation: All assessment and education complete, DC therapy    Frequency       Co-evaluation               AM-PAC PT "6 Clicks" Mobility  Outcome Measure Help needed turning from your back to your side while in a flat bed without using bedrails?: None Help needed moving from lying on your back to sitting on the side of a flat bed without using bedrails?: None Help needed moving to and from a bed to a chair (including a wheelchair)?: A Little Help needed standing up from a chair using your arms (e.g., wheelchair or bedside chair)?: A Little Help needed to walk in hospital room?: A Little Help needed climbing 3-5 steps with a railing? : A Little 6 Click Score: 20    End of Session Equipment Utilized During Treatment: Gait belt Activity Tolerance: Patient tolerated treatment well Patient left: in bed;with call bell/phone within reach;with family/visitor present Nurse Communication: Mobility status      Time: 1610-9604 PT Time Calculation (min) (ACUTE ONLY): 40 min   Charges:   PT Evaluation $PT Eval Moderate Complexity: 1 Mod PT Treatments $Gait Training: 8-22 mins $Therapeutic Exercise: 8-22  mins PT General Charges $$ ACUTE PT VISIT: 1 Visit       Tamala Ser PT 03/15/2024  Acute Rehabilitation Services  Office [phone]

## 2024-03-15 NOTE — Op Note (Signed)
 03/15/2024  9:07 AM  PATIENT:  Christina Myers   MRN: [id number]  PRE-OPERATIVE DIAGNOSIS: Left hip primary localized osteoarthritis  POST-OPERATIVE DIAGNOSIS:  same  PROCEDURE:  Procedure(s): ARTHROPLASTY, HIP, TOTAL,POSTERIOR APPROACH, left  PREOPERATIVE INDICATIONS:    Christina Myers is an 83 y.o. female who has a diagnosis of left hip primary localized osteoarthritis and elected for surgical management after failing conservative treatment.  The risks benefits and alternatives were discussed with the patient including but not limited to the risks of nonoperative treatment, versus surgical intervention including infection, bleeding, nerve injury, periprosthetic fracture, the need for revision surgery, dislocation, leg length discrepancy, blood clots, cardiopulmonary complications, morbidity, mortality, among others, and they were willing to proceed.     OPERATIVE REPORT     SURGEON:  Teryl Lucy, MD    ASSISTANT:  Janine Ores, PA-C, (Present throughout the entire procedure,  necessary for completion of procedure in a timely manner, assisting with retraction, instrumentation, and closure)     ANESTHESIA: Spinal  ESTIMATED BLOOD LOSS: 100 mL    COMPLICATIONS:  None.     UNIQUE ASPECTS OF THE CASE: Bone quality was mediocre.  I cut the femoral neck twice to have excellent access to the acetabulum.  It was a little difficult to gauge native acetabular anatomy because of substantial osteophyte formation, but I ended up removing some anterior osteophyte off of the rim, and matching her anatomy to the best that I could, I had excellent stability at the completion of the case and no real impingement of the neck.  She had a significant amount of capsular contracture that was pre-existing.  The size 6 stem sat about 1 tooth proud laterally, and less than half of the tooth proud medially.   Implant Name Type Inv. Item Serial No. Manufacturer Lot No. LRB No. Used Action  ELIMINATOR  HOLE APEX DEPUY - [id number] Hips ELIMINATOR HOLE APEX DEPUY  DEPUY ORTH[employer] [id number] Left 1 Implanted  PINNSECTOR W/GRIP ACE CUP - [id number] Joint PINNSECTOR W/GRIP ACE CUP  DEPUY ORTH[employer] [id number] Left 1 Implanted  SCREW 6.5MMX25MM - [id number] Screw SCREW 6.5MMX25MM  DEPUY ORTHOPAED[employer] [id number] Left 1 Implanted  STEM FEMORAL SZ STD ACTIS - [id number] Stem STEM FEMORAL SZ STD ACTIS  DEPUY ORTHOPAEDICS [id number] Left 1 Implanted  PINN ALTRX NEUT ID X OD 32X48 - [id number]  PINN ALTRX NEUT ID X OD 32X48  DEPUY ORTHOPAED[employer] [id number] Left 1 Implanted  HEAD FEM STD 32X+1 STRL - [id number] Hips HEAD FEM STD 32X+1 STRL  DEPUY ORTHOPAED[employer] [id number] Left 1 Implanted      PROCEDURE IN DETAIL:   The patient was met in the holding area and  identified.  The appropriate hip was identified and marked at the operative site.  The patient was then transported to the OR  and  placed under anesthesia.  At that point, the patient was  placed in the lateral decubitus position with the operative side up and  secured to the operating room table and all bony prominences padded.     The operative lower extremity was prepped from the iliac crest to the distal leg.  Sterile draping was performed.  Time out was performed prior to incision.      A routine posterolateral approach was utilized via sharp dissection  carried down through the subcutaneous tissue.  Gross bleeders were Bovie coagulated.  The iliotibial band was identified and incised along the length of the skin incision.  Self-retaining retractors were  inserted.  With the hip internally rotated, the short external rotators  were identified. The piriformis and capsule was tagged with Ethibond, and the hip capsule released in a T-type fashion.  The femoral neck was exposed, and I resected the femoral neck using the appropriate jig. This was performed at approximately a thumb's breadth above the lesser trochanter.  I ended up cutting the femur  a second time to have adequate access.    I then exposed the deep acetabulum, cleared out any tissue including the ligamentum teres.  A wing retractor was placed.  The fossa was completely ossified, and so I did not have a good way to assess depth.  After adequate visualization, I excised the labrum, and then sequentially reamed.  I placed the trial acetabulum, which seated nicely, and then impacted the real cup into place.  Appropriate version and inclination was confirmed clinically matching their bony anatomy, and also with the use of the jig.  I placed a cancellous screw to augment fixation.  A trial polyethylene liner was placed and the wing retractor removed.    I then prepared the proximal femur using the cookie-cutter, the lateralizing reamer, and then sequentially reamed and broached.  A trial broach, neck, and head was utilized, and I reduced the hip and it was found to have excellent stability with functional range of motion. The trial components were then removed, and the real polyethylene liner was placed.  I then impacted the real femoral prosthesis into place into the appropriate version, slightly anteverted to the normal anatomy, and I impacted the real head ball into place. The hip was then reduced and taken through functional range of motion and found to have excellent stability. Leg lengths were restored.  I then used a 2 mm drill bits to pass the Ethibond suture from the capsule and piriformis through the greater trochanter, and secured this. Excellent posterior capsular repair was achieved. I also closed the T in the capsule.  I then irrigated the hip copiously again, and repaired the fascia with Stratafix, followed by Vicryl for the subcutaneous tissue, Monocryl for the skin, Steri-Strips and sterile gauze. The wounds were injected. The patient was then awakened and returned to PACU in stable and satisfactory condition. There were no complications.  Teryl Lucy, MD Orthopedic  Surgeon [phone]   03/15/2024 9:07 AM

## 2024-03-15 NOTE — Anesthesia Preprocedure Evaluation (Addendum)
 Anesthesia Evaluation  Patient identified by MRN, date of birth, ID band Patient awake    Reviewed: Allergy & Precautions, NPO status , Patient's Chart, lab work & pertinent test results  History of Anesthesia Complications (+) PONV and history of anesthetic complications  Airway Mallampati: II  TM Distance: >3 FB Neck ROM: Full    Dental  (+) Dental Advisory Given, Teeth Intact   Pulmonary pneumonia, resolved, neg recent URI, former smoker   Pulmonary exam normal breath sounds clear to auscultation       Cardiovascular hypertension, + dysrhythmias  Rhythm:Regular Rate:Normal + Systolic murmurs    Neuro/Psych negative neurological ROS     GI/Hepatic negative GI ROS, Neg liver ROS,,,  Endo/Other  Hypothyroidism    Renal/GU negative Renal ROS     Musculoskeletal  (+) Arthritis ,    Abdominal   Peds  Hematology negative hematology ROS (+)   Anesthesia Other Findings   Reproductive/Obstetrics                              Anesthesia Physical Anesthesia Plan  ASA: 2  Anesthesia Plan: Spinal   Post-op Pain Management: Tylenol PO (pre-op)*   Induction: Intravenous  PONV Risk Score and Plan: Ondansetron, Dexamethasone, Propofol infusion and Treatment may vary due to age or medical condition  Airway Management Planned: Natural Airway  Additional Equipment:   Intra-op Plan:   Post-operative Plan:   Informed Consent: I have reviewed the patients History and Physical, chart, labs and discussed the procedure including the risks, benefits and alternatives for the proposed anesthesia with the patient or authorized representative who has indicated his/her understanding and acceptance.     Dental advisory given  Plan Discussed with: CRNA  Anesthesia Plan Comments:         Anesthesia Quick Evaluation

## 2024-03-15 NOTE — Anesthesia Procedure Notes (Signed)
 Procedure Name: MAC Date/Time: 03/15/2024 7:33 AM  Performed by: Sindy Guadeloupe, CRNAPre-anesthesia Checklist: Patient identified, Emergency Drugs available, Suction available, Patient being monitored and Timeout performed Oxygen Delivery Method: Simple face mask Placement Confirmation: positive ETCO2

## 2024-03-15 NOTE — Transfer of Care (Signed)
 Immediate Anesthesia Transfer of Care Note  Patient: Christina Myers  Procedure(s) Performed: ARTHROPLASTY, HIP, TOTAL,POSTERIOR APPROACH (Left: Hip)  Patient Location: PACU  Anesthesia Type:Spinal  Level of Consciousness: awake, alert , and patient cooperative  Airway & Oxygen Therapy: Patient Spontanous Breathing and Patient connected to face mask oxygen  Post-op Assessment: Report given to RN and Post -op Vital signs reviewed and stable  Post vital signs: Reviewed and stable  Last Vitals:  Vitals Value Taken Time  BP 100/56 03/15/24 0930  Temp    Pulse 57 03/15/24 0934  Resp 18 03/15/24 0934  SpO2 100 % 03/15/24 0934  Vitals shown include unfiled device data.  Last Pain:  Vitals:   03/15/24 0608  TempSrc: Oral  PainSc:          Complications: No notable events documented.

## 2024-03-16 ENCOUNTER — Encounter (HOSPITAL_COMMUNITY): Payer: Self-pay | Admitting: Orthopedic Surgery

## 2024-03-22 DIAGNOSIS — M1612 Unilateral primary osteoarthritis, left hip: Secondary | ICD-10-CM | POA: Diagnosis not present

## 2024-03-28 DIAGNOSIS — M1612 Unilateral primary osteoarthritis, left hip: Secondary | ICD-10-CM | POA: Diagnosis not present

## 2024-03-30 DIAGNOSIS — M1612 Unilateral primary osteoarthritis, left hip: Secondary | ICD-10-CM | POA: Diagnosis not present

## 2024-04-05 DIAGNOSIS — M1612 Unilateral primary osteoarthritis, left hip: Secondary | ICD-10-CM | POA: Diagnosis not present

## 2024-04-07 DIAGNOSIS — M1612 Unilateral primary osteoarthritis, left hip: Secondary | ICD-10-CM | POA: Diagnosis not present

## 2024-04-12 DIAGNOSIS — M1612 Unilateral primary osteoarthritis, left hip: Secondary | ICD-10-CM | POA: Diagnosis not present

## 2024-04-13 ENCOUNTER — Telehealth: Payer: Self-pay | Admitting: Internal Medicine

## 2024-04-13 DIAGNOSIS — K2289 Other specified disease of esophagus: Secondary | ICD-10-CM

## 2024-04-13 DIAGNOSIS — R1013 Epigastric pain: Secondary | ICD-10-CM

## 2024-04-13 DIAGNOSIS — R634 Abnormal weight loss: Secondary | ICD-10-CM

## 2024-04-13 NOTE — Telephone Encounter (Signed)
 Copied from CRM [id number]. Topic: Referral - Request for Referral >> Apr 13, 2024  9:45 AM Crispin Dolphin wrote: Did the patient discuss referral with their provider in the last year? Yes previosly referred but not able to go at that time (If No - schedule appointment) (If Yes - send message)  Appointment offered? No  Type of order/referral and detailed reason for visit: Christina Myers - referred 11/2023 but patient did not make appt referral closed. Pt is ready to make appt would like t know who provider prefers.   Preference of office, provider, location: N/A  If referral order, have you been seen by this specialty before? Yes (If Yes, this issue or another issue? When? Where?  Can we respond through MyChart? No

## 2024-04-14 DIAGNOSIS — M1612 Unilateral primary osteoarthritis, left hip: Secondary | ICD-10-CM | POA: Diagnosis not present

## 2024-04-18 DIAGNOSIS — M1612 Unilateral primary osteoarthritis, left hip: Secondary | ICD-10-CM | POA: Diagnosis not present

## 2024-04-20 NOTE — Telephone Encounter (Signed)
 Referral was sent to [facility].

## 2024-04-21 DIAGNOSIS — M1612 Unilateral primary osteoarthritis, left hip: Secondary | ICD-10-CM | POA: Diagnosis not present

## 2024-04-26 DIAGNOSIS — M1612 Unilateral primary osteoarthritis, left hip: Secondary | ICD-10-CM | POA: Diagnosis not present

## 2024-04-28 DIAGNOSIS — M1612 Unilateral primary osteoarthritis, left hip: Secondary | ICD-10-CM | POA: Diagnosis not present

## 2024-05-03 DIAGNOSIS — M1612 Unilateral primary osteoarthritis, left hip: Secondary | ICD-10-CM | POA: Diagnosis not present

## 2024-05-04 ENCOUNTER — Encounter: Payer: Self-pay | Admitting: Internal Medicine

## 2024-05-09 DIAGNOSIS — M1612 Unilateral primary osteoarthritis, left hip: Secondary | ICD-10-CM | POA: Diagnosis not present

## 2024-05-17 DIAGNOSIS — M1612 Unilateral primary osteoarthritis, left hip: Secondary | ICD-10-CM | POA: Diagnosis not present

## 2024-05-25 DIAGNOSIS — H353222 Exudative age-related macular degeneration, left eye, with inactive choroidal neovascularization: Secondary | ICD-10-CM | POA: Diagnosis not present

## 2024-05-27 DIAGNOSIS — M5441 Lumbago with sciatica, right side: Secondary | ICD-10-CM | POA: Diagnosis not present

## 2024-06-03 ENCOUNTER — Telehealth: Payer: Self-pay | Admitting: *Deleted

## 2024-06-03 NOTE — Telephone Encounter (Signed)
 Attempted to reach pt. Left a voicemail to call us back.

## 2024-06-03 NOTE — Telephone Encounter (Unsigned)
 Copied from CRM [id number]. Topic: Clinical - Medication Question >> Jun 01, 2024  8:31 AM Emmet Harm C wrote: Reason for CRM: Patient wants to know if she should change SYNTHROID  100 MCG tablet to 80 MCG tablet a day

## 2024-06-07 NOTE — Telephone Encounter (Signed)
 Spoke to pt. Inform pt,  provider is back this Thursday to address her questions as last note from visit didn't states the dose of med pt can try. Inform pt, once clarify with provider this Thursday, will contact her. Pt verbalized understanding.

## 2024-06-08 DIAGNOSIS — H35371 Puckering of macula, right eye: Secondary | ICD-10-CM | POA: Diagnosis not present

## 2024-06-08 DIAGNOSIS — H353112 Nonexudative age-related macular degeneration, right eye, intermediate dry stage: Secondary | ICD-10-CM | POA: Diagnosis not present

## 2024-06-08 DIAGNOSIS — H43813 Vitreous degeneration, bilateral: Secondary | ICD-10-CM | POA: Diagnosis not present

## 2024-06-08 DIAGNOSIS — H43821 Vitreomacular adhesion, right eye: Secondary | ICD-10-CM | POA: Diagnosis not present

## 2024-06-08 DIAGNOSIS — H353221 Exudative age-related macular degeneration, left eye, with active choroidal neovascularization: Secondary | ICD-10-CM | POA: Diagnosis not present

## 2024-06-09 MED ORDER — LEVOTHYROXINE SODIUM 75 MCG PO TABS: 75.0000 ug | ORAL_TABLET | Freq: Every day | ORAL | 3 refills | Status: AC

## 2024-06-09 NOTE — Telephone Encounter (Signed)
 So ok to send in  Synthroid   75 mg per day  disp 90 refill x 3 ( total dose  per week is 525mcg ) as opposed to 100 mcg 5 days per week ( total 500 mcg per week) .

## 2024-06-09 NOTE — Addendum Note (Signed)
 Addended by: Anuar Walgren on: 06/09/2024 02:57 PM   Modules accepted: Orders

## 2024-06-09 NOTE — Telephone Encounter (Signed)
 Attempted to reach pt. Husband answered. Inform him we will send in the Rx and for pt to contact us  back. Pt's spouse verbalized understanding.

## 2024-06-10 ENCOUNTER — Telehealth: Payer: Self-pay

## 2024-06-10 NOTE — Telephone Encounter (Signed)
 Copied from CRM [id number]. Topic: Clinical - Medication Question >> Jun 09, 2024  5:06 PM [place] J wrote: Reason for CRM: Patient would like to speak with Dr. Scotty Cyphers assistant in regard to her levothyroxine (SYNTHROID ) 75 MCG tablet. She stated that it has to do with the dosage and really needs to speak with her.

## 2024-06-10 NOTE — Telephone Encounter (Signed)
 Spoke to pt. Inform pt per provider's recommends to take 75mcg daily and Rx is sent to her mail order pharmacy.   Pt is concerns that her mail order might not delivery Rx on time. But if that is the case, she said she will contact us  to send it in to a local pharmacy. Inform pt that would be fine and to let us  know when she needs to send it in. Made pt aware that our office will be closed on Friday July 4. Pt verbalized understanding.

## 2024-06-10 NOTE — Telephone Encounter (Signed)
 Spoke to pt. Please see other phone encounter.

## 2024-06-27 DIAGNOSIS — M1612 Unilateral primary osteoarthritis, left hip: Secondary | ICD-10-CM | POA: Diagnosis not present

## 2024-06-28 ENCOUNTER — Encounter: Payer: Self-pay | Admitting: Internal Medicine

## 2024-06-28 ENCOUNTER — Ambulatory Visit: Admitting: Internal Medicine

## 2024-06-28 ENCOUNTER — Telehealth: Payer: Self-pay | Admitting: Internal Medicine

## 2024-06-28 VITALS — BP 118/72 | HR 62 | Ht 60.0 in | Wt 100.8 lb

## 2024-06-28 DIAGNOSIS — R634 Abnormal weight loss: Secondary | ICD-10-CM

## 2024-06-28 DIAGNOSIS — R1013 Epigastric pain: Secondary | ICD-10-CM | POA: Diagnosis not present

## 2024-06-28 DIAGNOSIS — K219 Gastro-esophageal reflux disease without esophagitis: Secondary | ICD-10-CM | POA: Diagnosis not present

## 2024-06-28 DIAGNOSIS — K5730 Diverticulosis of large intestine without perforation or abscess without bleeding: Secondary | ICD-10-CM | POA: Diagnosis not present

## 2024-06-28 DIAGNOSIS — R1310 Dysphagia, unspecified: Secondary | ICD-10-CM

## 2024-06-28 MED ORDER — PANTOPRAZOLE SODIUM 40 MG PO TBEC: 40.0000 mg | DELAYED_RELEASE_TABLET | Freq: Every day | ORAL | 3 refills | Status: DC

## 2024-06-28 NOTE — Telephone Encounter (Signed)
 Patient requesting f/u call to discuss medication list. Please advise.

## 2024-06-28 NOTE — Patient Instructions (Signed)
 We have sent the following medications to your pharmacy for you to pick up at your convenience: Pantoprazole    You have been scheduled for an endoscopy. Please follow written instructions given to you at your visit today.  If you use inhalers (even only as needed), please bring them with you on the day of your procedure.  If you take any of the following medications, they will need to be adjusted prior to your procedure:   DO NOT TAKE 7 DAYS PRIOR TO TEST- Trulicity (dulaglutide) Ozempic, Wegovy (semaglutide) Mounjaro (tirzepatide) Bydureon Bcise (exanatide extended release)  DO NOT TAKE 1 DAY PRIOR TO YOUR TEST Rybelsus (semaglutide) Adlyxin (lixisenatide) Victoza (liraglutide) Byetta (exanatide) ___________________________________________________________________________   Due to recent changes in healthcare laws, you may see the results of your imaging and laboratory studies on MyChart before your provider has had a chance to review them.  We understand that in some cases there may be results that are confusing or concerning to you. Not all laboratory results come back in the same time frame and the provider may be waiting for multiple results in order to interpret others.  Please give us  48 hours in order for your provider to thoroughly review all the results before contacting the office for clarification of your results.   Thank you for choosing me and [facility].  Dr.John Abran

## 2024-06-28 NOTE — Telephone Encounter (Signed)
 Called and spoke with patient. We went over medication list. I removed medications that patient states that she is not taking. Patient was thankful for the call back.

## 2024-06-29 ENCOUNTER — Encounter: Payer: Self-pay | Admitting: Internal Medicine

## 2024-06-29 NOTE — Progress Notes (Signed)
 HISTORY OF PRESENT ILLNESS:  Christina Myers is a 83 y.o. 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  Previous GI patient of Dr. Reyes Plough.  The patient tells me that around August 2024 she began to develop problems with dull epigastric discomfort while eating.  Mostly the evening meal.  Sounds somewhat like dysphagia.  She does have a history of reflux.  She takes Gaviscon at night prophylactically.  To evaluate this epigastric discomfort she underwent ultrasound December 2024.  This was negative.  Reviewing her blood work from March 2025 shows unremarkable basic metabolic panel.  Unremarkable CBC with hemoglobin 14.1.  Previous liver tests in December 2024 were normal.  Review of the electronic record shows about 7 pound weight loss in 18 months.  GI review of systems is otherwise remarkable for belching and occasional constipation.  She takes baby aspirin .    REVIEW OF SYSTEMS:  All non-GI ROS negative except for itching  Past Medical History:  Diagnosis Date   COLONIC POLYPS, HX OF 08/30/2007   Qualifier: Diagnosis of  By: Lilton CMA, Jacqualynn     Dysrhythmia    PVCs   Dysrhythmia    Bradycardia   Hx of colonic polyps    Hypertension    Hypothyroidism    Macular degeneration, wet (HCC)    left eye only   OSTEOARTHRITIS, HAND 04/24/2009   Qualifier: Diagnosis of  By: Charlett MD, Apolinar POUR    Pneumonia    PONV (postoperative nausea and vomiting)    PVC (premature ventricular contraction)    [facility] evaluation 7/03   Seasonal allergies    Skin cancer    hx of bcca of face, legs   SKIN CANCER, HX OF 02/02/2008   Qualifier: Diagnosis of  By: Charlett MD, Apolinar POUR     Past Surgical History:  Procedure Laterality Date   EYE SURGERY Bilateral    FACIAL COSMETIC SURGERY Bilateral 2001   Face lift   KNEE CARTILAGE SURGERY Right    meniscus  repair   SHOULDER ARTHROSCOPY W/ ROTATOR CUFF REPAIR Right 2005   TONSILLECTOMY     TOTAL HIP ARTHROPLASTY Left 03/15/2024   Procedure: ARTHROPLASTY, HIP, TOTAL,POSTERIOR APPROACH;  Surgeon: Josefina Chew, MD;  Location: [facility];  Service: Orthopedics;  Laterality: Left;   tubal lig      Social History Elya Tarquinio Anger  reports that she has quit smoking. She has never used smokeless tobacco. She reports current alcohol use. She reports that she does not use drugs.  family history includes Cancer in an other family member; Colon cancer in her mother; Diabetes in an other family member; Heart disease in an other family member; Stroke in an other family member.  Allergies  Allergen Reactions   Amoxicillin-Pot Clavulanate     REACTION: colitis  after ceclor  adn augmentin years ago   Cefdinir     REACTION: colitis diarrhea       PHYSICAL EXAMINATION: Vital signs: BP 118/72   Pulse 62   Ht 5' (1.524 m)   Wt 100 lb 12.8 oz (45.7 kg)   BMI 19.69 kg/m   Constitutional: generally well-appearing, no acute distress Psychiatric: alert and oriented x3, cooperative Eyes: extraocular movements intact, anicteric, conjunctiva pink Mouth: oral pharynx moist, no lesions Neck: supple no lymphadenopathy Cardiovascular: heart regular rate and rhythm, no murmur Lungs: clear to auscultation bilaterally Abdomen: soft, nontender, nondistended,  no obvious ascites, no peritoneal signs, normal bowel sounds, no organomegaly Rectal: Omitted Extremities: no clubbing, cyanosis, or lower extremity edema bilaterally Skin: no lesions on visible extremities Neuro: No focal deficits.  Cranial nerves intact  ASSESSMENT:  1.  Epigastric discomfort with evening meals.  Query GERD.  Query dysphagia 2.  GERD. 3.  Mild weight loss 4.  Colonoscopy 2021 with diverticulosis.  Otherwise normal   PLAN:  1.  Prescribed pantoprazole  40 mg daily.  Medication risks reviewed 2.  Schedule upper endoscopy with  possible esophageal dilation to evaluate epigastric pain, weight loss, and possible dysphagia.The nature of the procedure, as well as the risks, benefits, and alternatives were carefully and thoroughly reviewed with the patient. Ample time for discussion and questions allowed. The patient understood, was satisfied, and agreed to proceed. 3.  Problems persist despite PPI and upper endoscopy unrevealing, then consider advanced imaging in the form of CT with fine cuts of the pancreas.  Total time of 60 minutes was spent preparing to see the patient, reviewing outside records, outside colonoscopy report, outside laboratories, obtaining comprehensive history, performing medically appropriate physical examination, counseling and educating the patient regarding the above listed issues, ordering medication, ordering endoscopic procedure, and documenting clinical information in the health record

## 2024-07-04 ENCOUNTER — Encounter: Payer: Self-pay | Admitting: Internal Medicine

## 2024-07-04 ENCOUNTER — Ambulatory Visit (AMBULATORY_SURGERY_CENTER): Admitting: Internal Medicine

## 2024-07-04 VITALS — BP 135/63 | HR 53 | Temp 97.2°F | Resp 13 | Ht 60.0 in | Wt 100.0 lb

## 2024-07-04 DIAGNOSIS — R1310 Dysphagia, unspecified: Secondary | ICD-10-CM

## 2024-07-04 DIAGNOSIS — R1013 Epigastric pain: Secondary | ICD-10-CM

## 2024-07-04 DIAGNOSIS — K219 Gastro-esophageal reflux disease without esophagitis: Secondary | ICD-10-CM | POA: Diagnosis not present

## 2024-07-04 DIAGNOSIS — K222 Esophageal obstruction: Secondary | ICD-10-CM

## 2024-07-04 DIAGNOSIS — K449 Diaphragmatic hernia without obstruction or gangrene: Secondary | ICD-10-CM

## 2024-07-04 MED ORDER — SODIUM CHLORIDE 0.9 % IV SOLN: 500.0000 mL | INTRAVENOUS | Status: DC

## 2024-07-04 NOTE — Patient Instructions (Signed)
 Please read handouts provided. Resume previous diet. Continue present medications. Continue Pantoprazole . Office follow-up with Dr. Abran in 6-8 weeks.   YOU HAD AN ENDOSCOPIC PROCEDURE TODAY AT THE [facility]:   Refer to the procedure report that was given to you for any specific questions about what was found during the examination.  If the procedure report does not answer your questions, please call your gastroenterologist to clarify.  If you requested that your care partner not be given the details of your procedure findings, then the procedure report has been included in a sealed envelope for you to review at your convenience later.  YOU SHOULD EXPECT: Some feelings of bloating in the abdomen. Passage of more gas than usual.  Walking can help get rid of the air that was put into your GI tract during the procedure and reduce the bloating. If you had a lower endoscopy (such as a colonoscopy or flexible sigmoidoscopy) you may notice spotting of blood in your stool or on the toilet paper. If you underwent a bowel prep for your procedure, you may not have a normal bowel movement for a few days.  Please Note:  You might notice some irritation and congestion in your nose or some drainage.  This is from the oxygen used during your procedure.  There is no need for concern and it should clear up in a day or so.  SYMPTOMS TO REPORT IMMEDIATELY:  Following upper endoscopy (EGD)  Vomiting of blood or coffee ground material  New chest pain or pain under the shoulder blades  Painful or persistently difficult swallowing  New shortness of breath  Fever of 100F or higher  Black, tarry-looking stools  For urgent or emergent issues, a gastroenterologist can be reached at any hour by calling [phone]. Do not use MyChart messaging for urgent concerns.    DIET:  We do recommend a small meal at first, but then you may proceed to your regular diet.  Drink plenty of fluids but you should  avoid alcoholic beverages for 24 hours.  ACTIVITY:  You should plan to take it easy for the rest of today and you should NOT DRIVE or use heavy machinery until tomorrow (because of the sedation medicines used during the test).    FOLLOW UP: Our staff will call the number listed on your records the next business day following your procedure.  We will call around 7:15- 8:00 am to check on you and address any questions or concerns that you may have regarding the information given to you following your procedure. If we do not reach you, we will leave a message.     If any biopsies were taken you will be contacted by phone or by letter within the next 1-3 weeks.  Please call us  at [phone] if you have not heard about the biopsies in 3 weeks.    SIGNATURES/CONFIDENTIALITY: You and/or your care partner have signed paperwork which will be entered into your electronic medical record.  These signatures attest to the fact that that the information above on your After Visit Summary has been reviewed and is understood.  Full responsibility of the confidentiality of this discharge information lies with you and/or your care-partner.

## 2024-07-04 NOTE — Progress Notes (Signed)
 Patient will call to schedule follow-up appointment with Dr. Abran after returning from trip. B.Areona Homer RN.

## 2024-07-04 NOTE — Progress Notes (Signed)
 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  Previous GI patient of Dr. Reyes Plough.   The patient tells me that around August 2024 she began to develop problems with dull epigastric discomfort while eating.  Mostly the evening meal.  Sounds somewhat like dysphagia.  She does have a history of reflux.  She takes Gaviscon at night prophylactically.  To evaluate this epigastric discomfort she underwent ultrasound December 2024.  This was negative.  Reviewing her blood work from March 2025 shows unremarkable basic metabolic panel.  Unremarkable CBC with hemoglobin 14.1.  Previous liver tests in December 2024 were normal.  Review of the electronic record shows about 7 pound weight loss in 18 months.   GI review of systems is otherwise remarkable for belching and occasional constipation.  She takes baby aspirin .       REVIEW OF SYSTEMS:   All non-GI ROS negative except for itching       Past Medical History:  Diagnosis Date   COLONIC POLYPS, HX OF 08/30/2007    Qualifier: Diagnosis of  By: Lilton CMA, Jacqualynn     Dysrhythmia      PVCs   Dysrhythmia      Bradycardia   Hx of colonic polyps     Hypertension     Hypothyroidism     Macular degeneration, wet (HCC)      left eye only   OSTEOARTHRITIS, HAND 04/24/2009    Qualifier: Diagnosis of  By: Charlett MD, Apolinar POUR    Pneumonia     PONV (postoperative nausea and vomiting)     PVC (premature ventricular contraction)      [facility] evaluation 7/03    Seasonal allergies     Skin cancer      hx of bcca of face, legs   SKIN CANCER, HX OF 02/02/2008    Qualifier: Diagnosis of  By: Charlett MD, Apolinar POUR                Past Surgical History:  Procedure Laterality Date   EYE SURGERY Bilateral     FACIAL COSMETIC SURGERY Bilateral 2001    Face lift   KNEE CARTILAGE SURGERY Right      meniscus repair   SHOULDER ARTHROSCOPY W/ ROTATOR CUFF REPAIR Right 2005   TONSILLECTOMY       TOTAL HIP ARTHROPLASTY Left 03/15/2024    Procedure: ARTHROPLASTY, HIP, TOTAL,POSTERIOR APPROACH;  Surgeon: Josefina Chew, MD;  Location: [facility];  Service: Orthopedics;  Laterality: Left;   tubal lig              Social History Christina Myers  reports that she has quit smoking. She has never used smokeless tobacco. She reports current alcohol use. She reports that she does not use drugs.   family history includes Cancer in an other family member; Colon cancer in her mother; Diabetes in an other family member; Heart disease in an other family member; Stroke in  an other family member.   Allergies       Allergies  Allergen Reactions   Amoxicillin-Pot Clavulanate        REACTION: colitis  after ceclor  adn augmentin years ago   Cefdinir        REACTION: colitis diarrhea            PHYSICAL EXAMINATION: Vital signs: BP 118/72   Pulse 62   Ht 5' (1.524 m)   Wt 100 lb 12.8 oz (45.7 kg)   BMI 19.69 kg/m   Constitutional: generally well-appearing, no acute distress Psychiatric: alert and oriented x3, cooperative Eyes: extraocular movements intact, anicteric, conjunctiva pink Mouth: oral pharynx moist, no lesions Neck: supple no lymphadenopathy Cardiovascular: heart regular rate and rhythm, no murmur Lungs: clear to auscultation bilaterally Abdomen: soft, nontender, nondistended, no obvious ascites, no peritoneal signs, normal bowel sounds, no organomegaly Rectal: Omitted Extremities: no clubbing, cyanosis, or lower extremity edema  bilaterally Skin: no lesions on visible extremities Neuro: No focal deficits.  Cranial nerves intact   ASSESSMENT:   1.  Epigastric discomfort with evening meals.  Query GERD.  Query dysphagia 2.  GERD. 3.  Mild weight loss 4.  Colonoscopy 2021 with diverticulosis.  Otherwise normal     PLAN:   1.  Prescribed pantoprazole  40 mg daily.  Medication risks reviewed 2.  Schedule upper endoscopy with possible esophageal dilation to evaluate epigastric pain, weight loss, and possible dysphagia.The nature of the procedure, as well as the risks, benefits, and alternatives were carefully and thoroughly reviewed with the patient. Ample time for discussion and questions allowed. The patient understood, was satisfied, and agreed to proceed. 3.  Problems persist despite PPI and upper endoscopy unrevealing, then consider advanced imaging in the form of CT with fine cuts of the pancreas.

## 2024-07-04 NOTE — Op Note (Signed)
 [facility] Patient Name: Christina Myers Procedure Date: 07/04/2024 10:52 AM MRN: [id number] Endoscopist: Norleen SAILOR. Abran , MD, [id number] Age: 83 Referring MD:  Date of Birth: 09/13/1941 Gender: Female Account #: [id number] Procedure:                Upper GI endoscopy with balloon dilation of the                            esophagus. 18-20 mm max Indications:              Dysphagia, , Esophageal reflux, GERD, postprandial                            epigastric pain, weight loss Medicines:                Monitored Anesthesia Care Procedure:                Pre-Anesthesia Assessment:                           - Prior to the procedure, a History and Physical                            was performed, and patient medications and                            allergies were reviewed. The patient's tolerance of                            previous anesthesia was also reviewed. The risks                            and benefits of the procedure and the sedation                            options and risks were discussed with the patient.                            All questions were answered, and informed consent                            was obtained. Prior Anticoagulants: The patient has                            taken no anticoagulant or antiplatelet agents. ASA                            Grade Assessment: II - A patient with mild systemic                            disease. After reviewing the risks and benefits,                            the patient was deemed in satisfactory condition to  undergo the procedure.                           After obtaining informed consent, the endoscope was                            passed under direct vision. Throughout the                            procedure, the patient's blood pressure, pulse, and                            oxygen saturations were monitored continuously. The                            Olympus Scope  [id number] was introduced through the                            mouth, and advanced to the second part of duodenum.                            The upper GI endoscopy was accomplished without                            difficulty. The patient tolerated the procedure                            well. Scope In: Scope Out: Findings:                 One benign-appearing, intrinsic moderate stenosis                            was found 30 cm from the incisors. This stenosis                            measured 1.5 cm (inner diameter). A TTS dilator was                            passed through the scope. Dilation with an 18-19-20                            mm balloon dilator was performed to 20 mm.                           The exam of the esophagus was otherwise normal.                           The stomach revealed a moderately large hiatal                            hernia (4 to 5 cm). The stomach was otherwise  unremarkable.                           The examined duodenum was normal.                           The cardia and gastric fundus were normal on                            retroflexion, save hiatal hernia. Complications:            No immediate complications. Estimated Blood Loss:     Estimated blood loss: none. Impression:               1. GERD                           2. Esophageal stricture status post dilation                           3. Moderately large hiatal hernia                           4. Otherwise unremarkable upper endoscopy                           5. Postprandial epigastric discomfort and weight                            loss. Recommendation:           - Patient has a contact number available for                            emergencies. The signs and symptoms of potential                            delayed complications were discussed with the                            patient. Return to normal activities tomorrow.                             Written discharge instructions were provided to the                            patient.                           - Resume previous diet.                           - Continue present medications.                           - CONTINUE PANTOPRAZOLE  DAILY                           - SCHEDULE CONTRAST CT of the abdomen pelvis  postprandial epigastric pain and weight loss                           - OFFICE FOLLOW UP with Dr. Abran in 6 to 8 weeks Norleen SAILOR. Abran, MD 07/04/2024 11:24:46 AM This report has been signed electronically.

## 2024-07-04 NOTE — Progress Notes (Signed)
 To pacu, VSS. Report to [email or web address].tb

## 2024-07-04 NOTE — Progress Notes (Signed)
 Called to room to assist during endoscopic procedure.  Patient ID and intended procedure confirmed with present staff. Received instructions for my participation in the procedure from the performing physician.

## 2024-07-05 ENCOUNTER — Other Ambulatory Visit: Payer: Self-pay

## 2024-07-05 ENCOUNTER — Telehealth: Payer: Self-pay | Admitting: *Deleted

## 2024-07-05 DIAGNOSIS — R1013 Epigastric pain: Secondary | ICD-10-CM

## 2024-07-05 NOTE — Telephone Encounter (Signed)
 No answer on  follow up call. Left message.

## 2024-07-08 ENCOUNTER — Ambulatory Visit (HOSPITAL_COMMUNITY)
Admission: RE | Admit: 2024-07-08 | Discharge: 2024-07-08 | Disposition: A | Discharge status: Home / Self Care | Source: Ambulatory Visit | Attending: Internal Medicine | Admitting: Internal Medicine

## 2024-07-08 DIAGNOSIS — K5730 Diverticulosis of large intestine without perforation or abscess without bleeding: Secondary | ICD-10-CM | POA: Diagnosis not present

## 2024-07-08 DIAGNOSIS — R1013 Epigastric pain: Secondary | ICD-10-CM | POA: Insufficient documentation

## 2024-07-08 DIAGNOSIS — K449 Diaphragmatic hernia without obstruction or gangrene: Secondary | ICD-10-CM | POA: Diagnosis not present

## 2024-07-08 MED ORDER — IOHEXOL 300 MG/ML  SOLN
100.0000 mL | Freq: Once | INTRAMUSCULAR | Status: AC | PRN
  Administered 2024-07-08: 80 mL via INTRAVENOUS

## 2024-07-08 MED ORDER — IOHEXOL 9 MG/ML PO SOLN
1000.0000 mL | Freq: Once | ORAL | Status: AC
  Administered 2024-07-08: 1000 mL via ORAL

## 2024-07-09 ENCOUNTER — Ambulatory Visit: Payer: Self-pay | Admitting: Internal Medicine

## 2024-07-12 NOTE — Telephone Encounter (Signed)
 Patient returning call. Please advise

## 2024-07-27 DIAGNOSIS — M5441 Lumbago with sciatica, right side: Secondary | ICD-10-CM | POA: Diagnosis not present

## 2024-08-08 DIAGNOSIS — K080 Exfoliation of teeth due to systemic causes: Secondary | ICD-10-CM | POA: Diagnosis not present

## 2024-08-24 DIAGNOSIS — M5441 Lumbago with sciatica, right side: Secondary | ICD-10-CM | POA: Diagnosis not present

## 2024-08-25 DIAGNOSIS — D225 Melanocytic nevi of trunk: Secondary | ICD-10-CM | POA: Diagnosis not present

## 2024-08-25 DIAGNOSIS — L821 Other seborrheic keratosis: Secondary | ICD-10-CM | POA: Diagnosis not present

## 2024-08-25 DIAGNOSIS — L814 Other melanin hyperpigmentation: Secondary | ICD-10-CM | POA: Diagnosis not present

## 2024-08-25 DIAGNOSIS — L570 Actinic keratosis: Secondary | ICD-10-CM | POA: Diagnosis not present

## 2024-08-25 DIAGNOSIS — Z85828 Personal history of other malignant neoplasm of skin: Secondary | ICD-10-CM | POA: Diagnosis not present

## 2024-08-30 DIAGNOSIS — H04123 Dry eye syndrome of bilateral lacrimal glands: Secondary | ICD-10-CM | POA: Diagnosis not present

## 2024-09-06 DIAGNOSIS — K080 Exfoliation of teeth due to systemic causes: Secondary | ICD-10-CM | POA: Diagnosis not present

## 2024-09-15 NOTE — Progress Notes (Unsigned)
 Ellouise Console, PA-C [address] [place], [place]  [address] Phone: [phone]   Primary Care Physician: Charlett Apolinar POUR, MD  Primary Gastroenterologist:  Ellouise Console, PA-C / Norleen Kiang, MD   Chief Complaint: Follow-up epigastric pain, GERD, and mild weight loss       HPI:   Christina Myers is a 83 y.o. female, established patient Dr. Kiang, presents for 49-month follow-up of epigastric pain and mild weight loss.  Previous patient Dr. Luis and transferred care to Dr. Kiang.  3 months ago she was started on pantoprazole  40 mg once daily.  06/2024 EGD by Dr. Kiang: Benign distal esophageal stenosis dilated to 20 mm.  Moderately large hiatal hernia (4 to 5 cm).  Otherwise normal esophagus, stomach, and duodenum.  No biopsies.  06/2024 abdominal pelvic CT with contrast: Moderate sliding hiatal hernia.  Sigmoid diverticulosis but no diverticulitis.  Large stool burden consistent with constipation.  Normal liver, gallbladder, and pancreas.  Small cystocele.  No acute abnormality.  08/2020 last colonoscopy by Dr. Luis: Diverticulosis.  No polyps.  Excellent prep.  No repeat due to advanced age.  Current Outpatient Medications  Medication Sig Dispense Refill   alum hydroxide-mag trisilicate (GAVISCON) 80-20 MG CHEW chewable tablet Chew 1 tablet by mouth at bedtime. (Patient not taking: No sig reported)     Calcium-Vitamin D -Vitamin K (VIACTIV PO) Take 1 tablet by mouth at bedtime.     Cholecalciferol (VITAMIN D3) 2000 units TABS Take 2,000 Units by mouth daily.     estradiol  (ESTRACE ) 0.1 MG/GM vaginal cream 1 -2 gram intravaginally 2  x per week 42.5 g 3   levothyroxine  (SYNTHROID ) 75 MCG tablet Take 1 tablet (75 mcg total) by mouth daily before breakfast. 90 tablet 3   magnesium gluconate (MAGONATE) 500 MG tablet Take 500 mg by mouth daily.     pantoprazole  (PROTONIX ) 40 MG tablet Take 1 tablet (40 mg total) by mouth daily. 30 tablet 3   Propylene Glycol (SYSTANE BALANCE OP)  Place 1 drop into both eyes in the morning and at bedtime. (Patient not taking: Reported on 06/28/2024)     simvastatin  (ZOCOR ) 20 MG tablet TAKE 1 TABLET(20 MG) BY MOUTH AT BEDTIME 90 tablet 3   No current facility-administered medications for this visit.    Allergies as of 09/16/2024   (No Active Allergies)    Past Medical History:  Diagnosis Date   COLONIC POLYPS, HX OF 08/30/2007   Qualifier: Diagnosis of  By: Lilton CMA, Jacqualynn     Dysrhythmia    PVCs   Dysrhythmia    Bradycardia   Hx of colonic polyps    Hypertension    Hypothyroidism    Macular degeneration, wet (HCC)    left eye only   OSTEOARTHRITIS, HAND 04/24/2009   Qualifier: Diagnosis of  By: Charlett MD, Apolinar POUR    Pneumonia    PONV (postoperative nausea and vomiting)    PVC (premature ventricular contraction)    [facility] evaluation 7/03   Seasonal allergies    Skin cancer    hx of bcca of face, legs   SKIN CANCER, HX OF 02/02/2008   Qualifier: Diagnosis of  By: Charlett MD, Apolinar POUR     Past Surgical History:  Procedure Laterality Date   EYE SURGERY Bilateral    FACIAL COSMETIC SURGERY Bilateral 2001   Face lift   KNEE CARTILAGE SURGERY Right    meniscus repair   SHOULDER ARTHROSCOPY W/ ROTATOR CUFF REPAIR  Right 2005   TONSILLECTOMY     TOTAL HIP ARTHROPLASTY Left 03/15/2024   Procedure: ARTHROPLASTY, HIP, TOTAL,POSTERIOR APPROACH;  Surgeon: Josefina Chew, MD;  Location: [facility];  Service: Orthopedics;  Laterality: Left;   tubal lig      Review of Systems:    All systems reviewed and negative except where noted in HPI.    Physical Exam:  There were no vitals taken for this visit. No LMP recorded. Patient is postmenopausal.  General: Well-nourished, well-developed in no acute distress.  Lungs: Clear to auscultation bilaterally. Non-labored. Heart: Regular rate and rhythm, no murmurs rubs or gallops.  Abdomen: Bowel sounds are normal; Abdomen is Soft; No hepatosplenomegaly, masses or hernias;   No Abdominal Tenderness; No guarding or rebound tenderness. Neuro: Alert and oriented x 3.  Grossly intact.  Psych: Alert and cooperative, normal mood and affect.   Imaging Studies: No results found.  Labs: CBC    Component Value Date/Time   WBC 9.4 03/02/2024 1156   RBC 4.35 03/02/2024 1156   HGB 14.1 03/02/2024 1156   HCT 44.0 03/02/2024 1156   PLT 330 03/02/2024 1156   MCV 101.1 (H) 03/02/2024 1156   MCH 32.4 03/02/2024 1156   MCHC 32.0 03/02/2024 1156   RDW 13.6 03/02/2024 1156   LYMPHSABS 1.6 01/27/2024 1024   MONOABS 0.7 01/27/2024 1024   EOSABS 0.1 01/27/2024 1024   BASOSABS 0.0 01/27/2024 1024    CMP     Component Value Date/Time   NA 138 03/02/2024 1156   K 3.8 03/02/2024 1156   CL 103 03/02/2024 1156   CO2 26 03/02/2024 1156   GLUCOSE 97 03/02/2024 1156   BUN 20 03/02/2024 1156   CREATININE 0.70 03/02/2024 1156   CALCIUM 8.8 (L) 03/02/2024 1156   PROT 6.3 11/26/2023 1008   ALBUMIN 4.2 11/26/2023 1008   AST 14 11/26/2023 1008   ALT 15 11/26/2023 1008   ALKPHOS 92 11/26/2023 1008   BILITOT 0.5 11/26/2023 1008   GFRNONAA >60 03/02/2024 1156       Assessment and Plan:   Christina Myers is a 83 y.o. y/o female returns for follow-up of epigastric pain and GERD.  Recent EGD by Dr. Abran showed distal esophageal stricture dilated to 20 mm.  Moderately large hiatal hernia and evidence of GERD.  1.  Moderate large sliding hiatal hernia  2.  GERD - Continue pantoprazole  40 mg daily.  3.  Benign distal esophageal stricture dilated to 20 mm  4.  Constipation: Large stool burden seen on recent abdominal pelvic CT.  Otherwise unrevealing.  Ellouise Console, PA-C  Follow up ***

## 2024-09-16 ENCOUNTER — Ambulatory Visit: Admitting: Physician Assistant

## 2024-09-16 ENCOUNTER — Encounter: Payer: Self-pay | Admitting: Physician Assistant

## 2024-09-16 VITALS — BP 130/66 | HR 59 | Ht 60.0 in | Wt 102.5 lb

## 2024-09-16 DIAGNOSIS — K5900 Constipation, unspecified: Secondary | ICD-10-CM | POA: Diagnosis not present

## 2024-09-16 DIAGNOSIS — K219 Gastro-esophageal reflux disease without esophagitis: Secondary | ICD-10-CM

## 2024-09-16 DIAGNOSIS — K449 Diaphragmatic hernia without obstruction or gangrene: Secondary | ICD-10-CM | POA: Diagnosis not present

## 2024-09-16 DIAGNOSIS — K5904 Chronic idiopathic constipation: Secondary | ICD-10-CM

## 2024-09-16 MED ORDER — PANTOPRAZOLE SODIUM 40 MG PO TBEC: 40.0000 mg | DELAYED_RELEASE_TABLET | Freq: Every day | ORAL | 3 refills | Status: AC

## 2024-09-16 NOTE — Patient Instructions (Addendum)
   Continue OTC Benefiber Powder. Mix 1 Tablespoons in 6 - 8 ounces of a Drink Once Daily.  Start OTC Miralax Powder (mild gentle laxative) Mix 1 capful in 6 to 8 ounces of a drink once daily.  Recommend high-fiber diet, 30 g of fiber daily Eat fruits, vegetables, and whole grains Drink 64 ounces of water  / fluids daily.    Please follow up sooner if symptoms increase or worsen  Due to recent changes in healthcare laws, you may see the results of your imaging and laboratory studies on MyChart before your provider has had a chance to review them.  We understand that in some cases there may be results that are confusing or concerning to you. Not all laboratory results come back in the same time frame and the provider may be waiting for multiple results in order to interpret others.  Please give us  48 hours in order for your provider to thoroughly review all the results before contacting the office for clarification of your results.   Thank you for trusting me with your gastrointestinal care!   Ellouise Console, PA-C _______________________________________________________  If your blood pressure at your visit was 140/90 or greater, please contact your primary care physician to follow up on this.  _______________________________________________________  If you are age 42 or older, your body mass index should be between 23-30. Your Body mass index is 20.02 kg/m. If this is out of the aforementioned range listed, please consider follow up with your Primary Care Provider.  If you are age 56 or younger, your body mass index should be between 19-25. Your Body mass index is 20.02 kg/m. If this is out of the aformentioned range listed, please consider follow up with your Primary Care Provider.   ________________________________________________________  The [facility] providers would like to encourage you to use MYCHART to communicate with providers for non-urgent requests or questions.  Due to long  hold times on the telephone, sending your provider a message by [facility] may be a faster and more efficient way to get a response.  Please allow 48 business hours for a response.  Please remember that this is for non-urgent requests.  _______________________________________________________

## 2024-09-16 NOTE — Progress Notes (Signed)
 Noted

## 2024-09-29 DIAGNOSIS — K080 Exfoliation of teeth due to systemic causes: Secondary | ICD-10-CM | POA: Diagnosis not present

## 2024-09-30 ENCOUNTER — Emergency Department (HOSPITAL_COMMUNITY)

## 2024-09-30 ENCOUNTER — Other Ambulatory Visit (HOSPITAL_COMMUNITY)

## 2024-09-30 ENCOUNTER — Other Ambulatory Visit: Payer: Self-pay

## 2024-09-30 ENCOUNTER — Emergency Department (HOSPITAL_COMMUNITY)
Admission: EM | Admit: 2024-09-30 | Discharge: 2024-09-30 | Disposition: A | Discharge status: Home / Self Care | Source: Ambulatory Visit | Attending: Emergency Medicine | Admitting: Emergency Medicine

## 2024-09-30 DIAGNOSIS — R519 Headache, unspecified: Secondary | ICD-10-CM | POA: Diagnosis present

## 2024-09-30 DIAGNOSIS — K047 Periapical abscess without sinus: Secondary | ICD-10-CM | POA: Diagnosis not present

## 2024-09-30 DIAGNOSIS — R930 Abnormal findings on diagnostic imaging of skull and head, not elsewhere classified: Secondary | ICD-10-CM | POA: Diagnosis not present

## 2024-09-30 DIAGNOSIS — D72829 Elevated white blood cell count, unspecified: Secondary | ICD-10-CM | POA: Insufficient documentation

## 2024-09-30 DIAGNOSIS — K029 Dental caries, unspecified: Secondary | ICD-10-CM | POA: Diagnosis not present

## 2024-09-30 LAB — CBC WITH DIFFERENTIAL/PLATELET
Abs Immature Granulocytes: 0.12 K/uL — ABNORMAL HIGH (ref 0.00–0.07)
Basophils Absolute: 0.1 K/uL (ref 0.0–0.1)
Basophils Relative: 0 %
Eosinophils Absolute: 0.1 K/uL (ref 0.0–0.5)
Eosinophils Relative: 0 %
HCT: 43.6 % (ref 36.0–46.0)
Hemoglobin: 13.8 g/dL (ref 12.0–15.0)
Immature Granulocytes: 1 %
Lymphocytes Relative: 18 %
Lymphs Abs: 2.3 K/uL (ref 0.7–4.0)
MCH: 31.1 pg (ref 26.0–34.0)
MCHC: 31.7 g/dL (ref 30.0–36.0)
MCV: 98.2 fL (ref 80.0–100.0)
Monocytes Absolute: 1.3 K/uL — ABNORMAL HIGH (ref 0.1–1.0)
Monocytes Relative: 10 %
Neutro Abs: 8.8 K/uL — ABNORMAL HIGH (ref 1.7–7.7)
Neutrophils Relative %: 71 %
Platelets: 325 K/uL (ref 150–400)
RBC: 4.44 MIL/uL (ref 3.87–5.11)
RDW: 13.3 % (ref 11.5–15.5)
WBC: 12.6 K/uL — ABNORMAL HIGH (ref 4.0–10.5)
nRBC: 0 % (ref 0.0–0.2)

## 2024-09-30 LAB — COMPREHENSIVE METABOLIC PANEL WITH GFR
ALT: 40 U/L (ref 0–44)
AST: 34 U/L (ref 15–41)
Albumin: 3.5 g/dL (ref 3.5–5.0)
Alkaline Phosphatase: 114 U/L (ref 38–126)
Anion gap: 9 (ref 5–15)
BUN: 14 mg/dL (ref 8–23)
CO2: 28 mmol/L (ref 22–32)
Calcium: 9.4 mg/dL (ref 8.9–10.3)
Chloride: 102 mmol/L (ref 98–111)
Creatinine, Ser: 0.78 mg/dL (ref 0.44–1.00)
GFR, Estimated: >60 mL/min (ref >60)
Glucose, Bld: 100 mg/dL — ABNORMAL HIGH (ref 70–99)
Potassium: 4 mmol/L (ref 3.5–5.1)
Sodium: 140 mmol/L (ref 135–145)
Total Bilirubin: 0.5 mg/dL (ref 0.0–1.2)
Total Protein: 6.8 g/dL (ref 6.5–8.1)

## 2024-09-30 LAB — I-STAT CG4 LACTIC ACID, ED: Lactic Acid, Venous: 0.7 mmol/L (ref 0.5–1.9)

## 2024-09-30 MED ORDER — IOHEXOL 300 MG/ML  SOLN
100.0000 mL | Freq: Once | INTRAMUSCULAR | Status: AC | PRN
  Administered 2024-09-30: 100 mL via INTRAVENOUS

## 2024-09-30 MED ORDER — KETOROLAC TROMETHAMINE 15 MG/ML IJ SOLN
15.0000 mg | Freq: Once | INTRAMUSCULAR | Status: AC
  Administered 2024-09-30: 15 mg via INTRAVENOUS
  Filled 2024-09-30: qty 1

## 2024-09-30 MED ORDER — AMOXICILLIN-POT CLAVULANATE 875-125 MG PO TABS
1.0000 | ORAL_TABLET | Freq: Once | ORAL | Status: AC
  Administered 2024-09-30: 1 via ORAL
  Filled 2024-09-30: qty 1

## 2024-09-30 NOTE — ED Notes (Signed)
 Patient transported to CT

## 2024-09-30 NOTE — ED Triage Notes (Signed)
 Sent by orthodontist for IV abx to treat dental issue/swelling of the right side of her jaw. Denies any fevers at home.

## 2024-09-30 NOTE — Discharge Instructions (Addendum)
 Christina Myers  Thank you for allowing us  to take care of you today.  You came to the Emergency Department today because you were recently diagnosed with a dental infection, and you had pain and swelling primarily along the right side of your jaw.  Here in the emergency department we got labs and imaging.  Your labs are overall reassuring that the infection is not causing effects on your whole body (sepsis) because your acid number was not elevated and your vitals are normal.  You do have a pocket of infection along your jaw at the front of your mouth.  We spoke with the ENT doctors as well as the dentists on-call for the emergency department, and they feel that it is reasonable for you to continue Augmentin outpatient and follow-up with your outpatient dentistry providers.  We recommend continuing to take your Augmentin at home, you can take Tylenol  and ibuprofen every 3-4 hours as needed (alternating between the Tylenol  and ibuprofen), and follow-up closely with your dental providers.  To-Do: 1. Please follow-up with your primary doctor within 2 days / as soon as possible.   Please return to the Emergency Department or call 911 if you experience have worsening of your symptoms, or do not get better, difficulty breathing, chest pain, shortness of breath, severe or significantly worsening pain, high fever, severe confusion, pass out or have any reason to think that you need emergency medical care.   We hope you feel better soon.   Department of Emergency Medicine [facility]

## 2024-09-30 NOTE — ED Provider Notes (Signed)
 [place] EMERGENCY DEPARTMENT AT [facility] Provider Note   CSN: [id number] Arrival date & time: 09/30/24  1321     History Chief Complaint  Patient presents with   Oral Swelling    HPI: Christina Myers is a 83 y.o. female with no pertinent history who presents complaining of facial swelling and pain. Patient arrived via POV.  History provided by patient.  No interpreter required during this encounter.  Patient reports that she has had pain and swelling to her left jaw for several days.  Reports that she went to her dentist, who told her that she has an abscessed right mandibular molar.  Reports that she was started on Augmentin, and has taken 3 days of this.  Reports that her dentist referred her to an oral surgeon who performed an incision and drainage, reports that she intermittently has a foul taste in her mouth.  Reports that she has continued to have fullness along her right mandibular area, therefore she discussed with her oral surgeon who told her that it be reasonable to come to the emergency department for consideration for IV antibiotics.  Patient has not had any fever, chills, chest pain, shortness of breath, nausea, vomiting, diarrhea.  Patient's recorded medical, surgical, social, medication list and allergies were reviewed in the Snapshot window as part of the initial history.   Prior to Admission medications   Medication Sig Start Date End Date Taking? Authorizing Provider  alum hydroxide-mag trisilicate (GAVISCON) 80-20 MG CHEW chewable tablet Chew 1 tablet by mouth at bedtime.    [provider]  Calcium-Vitamin D -Vitamin K (VIACTIV PO) Take 1 tablet by mouth at bedtime.    [provider]  Cholecalciferol (VITAMIN D3) 2000 units TABS Take 2,000 Units by mouth daily.    [provider]  estradiol  (ESTRACE ) 0.1 MG/GM vaginal cream 1 -2 gram intravaginally 2  x per week 02/13/20   Panosh, Wanda K, MD  levothyroxine  (SYNTHROID ) 75  MCG tablet Take 1 tablet (75 mcg total) by mouth daily before breakfast. 06/09/24   Panosh, Wanda K, MD  magnesium gluconate (MAGONATE) 500 MG tablet Take 500 mg by mouth daily.    [provider]  Multiple Vitamins-Minerals (PRESERVISION AREDS PO) Take 2 capsules by mouth daily.    [provider]  pantoprazole  (PROTONIX ) 40 MG tablet Take 1 tablet (40 mg total) by mouth daily. 09/16/24   [place], PA-C  Propylene Glycol (SYSTANE BALANCE OP) Place 1 drop into both eyes in the morning and at bedtime.    [provider]  simvastatin  (ZOCOR ) 20 MG tablet TAKE 1 TABLET(20 MG) BY MOUTH AT BEDTIME 03/15/24   Panosh, Wanda K, MD     Allergies: Patient has no known allergies.   Review of Systems   ROS as per HPI  Physical Exam Updated Vital Signs BP (!) 142/67   Pulse 66   Temp 98.4 F (36.9 C)   Resp 18   SpO2 97%  Physical Exam Vitals and nursing note reviewed.  Constitutional:      General: She is not in acute distress.    Appearance: She is well-developed.  HENT:     Head: Normocephalic and atraumatic.     Mouth/Throat:     Mouth: Mucous membranes are moist.     Pharynx: Oropharynx is clear. No oropharyngeal exudate or posterior oropharyngeal erythema.     Comments: No appreciable palpable or visible gingival erythema or fluctuance Eyes:     Conjunctiva/sclera: Conjunctivae normal.  Cardiovascular:     Rate and Rhythm: Normal rate and regular rhythm.     Heart sounds: No murmur heard. Pulmonary:     Effort: Pulmonary effort is normal. No respiratory distress.     Breath sounds: Normal breath sounds.  Abdominal:     Palpations: Abdomen is soft.     Tenderness: There is no abdominal tenderness.  Musculoskeletal:        General: No swelling.     Cervical back: Neck supple. No rigidity.  Lymphadenopathy:     Cervical: Cervical adenopathy (Submandibular lymphadenopathy, shotty, TTP) present.  Skin:    General: Skin is warm and dry.     Capillary  Refill: Capillary refill takes less than 2 seconds.  Neurological:     Mental Status: She is alert.  Psychiatric:        Mood and Affect: Mood normal.     ED Course/ Medical Decision Making/ A&P    Procedures Procedures   Medications Ordered in ED Medications  iohexol  (OMNIPAQUE ) 300 MG/ML solution 100 mL (100 mLs Intravenous Contrast Given 09/30/24 1913)  ketorolac  (TORADOL ) 15 MG/ML injection 15 mg (15 mg Intravenous Given 09/30/24 2147)  amoxicillin-clavulanate (AUGMENTIN) 875-125 MG per tablet 1 tablet (1 tablet Oral Given 09/30/24 2147)    Medical Decision Making:   [name] is a 83 y.o. female who presents for facial swelling as per above.  Physical exam is pertinent for tender lymphadenopathy in the right submandibular area.   The differential includes but is not limited to abscess, cellulitis, failure of outpatient antibiotics, sepsis.  Independent historian: None  External data reviewed: No pertinent external data  Initial Plan:   Screening labs including CBC and Metabolic panel to evaluate for infectious or metabolic etiology of disease.  Lactic acid to evaluate for sepsis CT face to evaluate for structural/infectious intrathoracic pathology.   Labs: Ordered, Independent interpretation, and Details: CBC with slight leukocytosis to 12.6 with left shift.  No anemia or thrombocytopenia.  CMP without AKI, no Drawbridge transient, emergent LFT abnormality, lactic acid WNL  Radiology: Ordered, Independent interpretation, Details: Patient reviewed CT of the face, I do appreciate mild asymmetry with relative enlargement of the right parotid gland the left, I do not appreciate any significant hyperenhancement with focal fat stranding or focal fluid collection in this area, I do appreciate fluid collection due to the mandible just left of midline, and All images reviewed independently.  Agree with radiology report at this time.   CT Maxillofacial W Contrast Result  Date: 09/30/2024 EXAM: CT Face with contrast 09/30/2024 07:19:07 PM TECHNIQUE: CT of the face was performed with the administration of 100 mL of iohexol  (OMNIPAQUE ) 300 MG/ML solution. Multiplanar reformatted images are provided for review. Automated exposure control, iterative reconstruction, and/or weight based adjustment of the mA/kV was utilized to reduce the radiation dose to as low as reasonably achievable. COMPARISON: None available CLINICAL HISTORY: eval for infection from R maxillary molars FINDINGS: AERODIGESTIVE TRACT: No mass. No edema. SALIVARY GLANDS: No acute abnormality. LYMPH NODES: No suspicious cervical lymphadenopathy. SOFT TISSUES: A subperiosteal abscess is present just to the left of midline along the anterior aspect of the mandible. The collection measures 19 x 18 x 6 mm. No discrete etiology is evident. BRAIN, ORBITS AND SINUSES: Bilateral lens replacements are noted. The globes and orbits are otherwise within normal limits. No acute abnormality. BONES: A dental caries is present along the inferior aspect of the right paramedian maxillary incisor. No significant periapical lucencies. No acute  abnormality. No suspicious bone lesion. IMPRESSION: 1. Subperiosteal abscess along the anterior mandible just left of midline, measuring 19 x 18 x 6 mm. No discrete etiology identified. 2. Dental caries along the inferior aspect of the right paramedian maxillary incisor. No significant periapical lucencies. Electronically signed by: Lonni Necessary MD 09/30/2024 07:31 PM EDT RP Workstation: [id number]    EKG/Medicine tests: Not indicated EKG Interpretation:                  Interventions: Toradol ,  See the EMR for full details regarding lab and imaging results.  Patient presents for right jaw pain in the setting of recent dental infection, reports that she has had persistent pain and facial swelling, indicated.  Lactic acid WNL sepsis.  Patient mild leukocytosis, distant with known dental  infection.  Patient without any palpable/visible drainable abscess on exam given home dose of Augmentin while in the ED as well as Toradol  for pain control, CT of the face obtained and does demonstrate collection consistent with subperiosteal abscess anterior to the mandible just left of midline, rather than the right angle of the mandible where patient's symptoms are.  Patient does report that she experienced some palpation fluid which believes is coming from this area of her mouth, indicating that she likely is experiencing some degree of spontaneous drainage.  Given focal fluid collection, I did discuss with Dr. Ramsey with ENT who does not feel that patient requires any operative intervention from a ENT perspective, defers to dentistry, does not have recommendations regarding the necessity of IV antibiotics.  Additionally consulted dentistry and spoke with Dr. Maryruth, who feels that patient is on an appropriate course of antibiotics, requires continued follow-up with her oral surgeon, however does not feel that patient requires admission in the absence of threat to her airway, patient continues to have stable airway recommended continuation of antibiotics, discussed return precautions, as well as need for close follow-up with her dentistry providers for continued management and further drainage, no I&D performed in the ED given no overt fluctuance consistent with fluid collection evident on exam that would be amenable to drainage.  Presentation is most consistent with acute complicated illness, Current presentation is complicated by underlying chronic conditions, and I did consider and rule out acute life/limb-threatening illness  Discussion of management or test interpretations with external provider(s): ENT, Dr. Ramsey, dentistry, Dr. Maryruth  Risk Drugs:Prescription drug management  Disposition: DISCHARGE: I believe that the patient is safe for discharge home with outpatient follow-up.  Patient was informed of all pertinent physical exam, laboratory, and imaging findings. Patient's suspected etiology of their symptom presentation was discussed with the patient and all questions were answered. We discussed following up with PCP and outpatient dentistry. I provided thorough ED return precautions. The patient feels safe and comfortable with this plan.  MDM generated using voice dictation software and may contain dictation errors.  Please contact me for any clarification or with any questions.  Clinical Impression:  1. Dental infection      Discharge   Final Clinical Impression(s) / ED Diagnoses Final diagnoses:  Dental infection    Rx / DC Orders ED Discharge Orders     None        Rogelia Jerilynn RAMAN, MD 10/01/24 1501

## 2024-10-05 LAB — CULTURE, BLOOD (SINGLE)
Culture: NO GROWTH
Special Requests: ADEQUATE

## 2024-10-11 DIAGNOSIS — K080 Exfoliation of teeth due to systemic causes: Secondary | ICD-10-CM | POA: Diagnosis not present

## 2024-10-28 DIAGNOSIS — S22070A Wedge compression fracture of T9-T10 vertebra, initial encounter for closed fracture: Secondary | ICD-10-CM | POA: Diagnosis not present

## 2024-10-28 DIAGNOSIS — M546 Pain in thoracic spine: Secondary | ICD-10-CM | POA: Diagnosis not present

## 2024-10-28 DIAGNOSIS — S22060A Wedge compression fracture of T7-T8 vertebra, initial encounter for closed fracture: Secondary | ICD-10-CM | POA: Diagnosis not present

## 2024-10-28 DIAGNOSIS — Z96642 Presence of left artificial hip joint: Secondary | ICD-10-CM | POA: Diagnosis not present

## 2024-10-31 DIAGNOSIS — H35371 Puckering of macula, right eye: Secondary | ICD-10-CM | POA: Diagnosis not present

## 2024-10-31 DIAGNOSIS — H43813 Vitreous degeneration, bilateral: Secondary | ICD-10-CM | POA: Diagnosis not present

## 2024-10-31 DIAGNOSIS — H353221 Exudative age-related macular degeneration, left eye, with active choroidal neovascularization: Secondary | ICD-10-CM | POA: Diagnosis not present

## 2024-10-31 DIAGNOSIS — H43821 Vitreomacular adhesion, right eye: Secondary | ICD-10-CM | POA: Diagnosis not present

## 2024-10-31 DIAGNOSIS — H353112 Nonexudative age-related macular degeneration, right eye, intermediate dry stage: Secondary | ICD-10-CM | POA: Diagnosis not present

## 2024-11-08 ENCOUNTER — Ambulatory Visit (INDEPENDENT_AMBULATORY_CARE_PROVIDER_SITE_OTHER): Admitting: Family Medicine

## 2024-11-08 DIAGNOSIS — Z0000 Encounter for general adult medical examination without abnormal findings: Secondary | ICD-10-CM | POA: Diagnosis not present

## 2024-11-08 NOTE — Patient Instructions (Signed)
 I really enjoyed getting to talk with you today! I am available on Tuesdays and Thursdays for virtual visits if you have any questions or concerns, or if I can be of any further assistance.   CHECKLIST FROM ANNUAL WELLNESS VISIT:  -Follow up (please call to schedule if not scheduled after visit):   -yearly for annual wellness visit with primary care office  Here is a list of your preventive care/health maintenance measures and the plan for each if any are due:  PLAN For any measures below that may be due:    1. Please bring a copy of your immunization record from the pharmacy so that we can update your flu and covid vaccines  Health Maintenance  Topic Date Due   Influenza Vaccine  07/22/2024   COVID-19 Vaccine (8 - 2025-26 season) 08/22/2024   Medicare Annual Wellness (AWV)  11/08/2025   DTaP/Tdap/Td (4 - Td or Tdap) 04/15/2032   Pneumococcal Vaccine: 50+ Years  Completed   DEXA SCAN  Completed   Zoster Vaccines- Shingrix  Completed   Meningococcal B Vaccine  Aged Out    -See a dentist at least yearly  -Get your eyes checked and then per your eye specialist's recommendations  -Other issues addressed today:   1. Please gradual increase exercise (see below).   2. Please bring a copy of your advanced directives to Dr. Charlett.   -I have included below further information regarding a healthy whole foods based diet, physical activity guidelines for adults, stress management and opportunities for social connections. I hope you find this information useful.   -----------------------------------------------------------------------------------------------------------------------------------------------------------------------------------------------------------------------------------------------------------    NUTRITION: -eat real food: lots of colorful vegetables (half the plate) and fruits -5-7 servings of vegetables and fruits per day (fresh or steamed is best), exp. 2 servings  of vegetables with lunch and dinner and 2 servings of fruit per day. Berries and greens such as kale and collards are great choices.  -consume on a regular basis:  fresh fruits, fresh veggies, fish, nuts, seeds, healthy oils (such as olive oil, avocado oil), whole grains (make sure for bread/pasta/crackers/etc., that the first ingredient on label contains the word whole), legumes. -can eat small amounts of dairy and lean meat (no larger than the palm of your hand), but avoid processed meats such as ham, bacon, lunch meat, etc. -drink water  -try to avoid fast food and pre-packaged foods, processed meat, ultra processed foods/beverages (donuts, candy, etc.) -most experts advise limiting sodium to < 2300mg  per day, should limit further is any chronic conditions such as high blood pressure, heart disease, diabetes, etc. The American Heart Association advised that < 1500mg  is is ideal -try to avoid foods/beverages that contain any ingredients with names you do not recognize  -try to avoid foods/beverages  with added sugar or sweeteners/sweets  -try to avoid sweet drinks (including diet drinks): soda, juice, Gatorade, sweet tea, power drinks, diet drinks -try to avoid white rice, white bread, pasta (unless whole grain)  EXERCISE GUIDELINES FOR ADULTS: -if you wish to increase your physical activity, do so gradually and with the approval of your doctor -STOP and seek medical care immediately if you have any chest pain, chest discomfort or trouble breathing when starting or increasing exercise  -move and stretch your body, legs, feet and arms when sitting for long periods -Physical activity guidelines for optimal health in adults: -get at least 150 minutes per week of moderate exercise (can talk, but not sing); this is about 20-30 minutes of sustained activity 5-7 days  per week or two 10-15 minute episodes of sustained activity 5-7 days per week -do some muscle building/resistance training/strength  training at least 2 days per week  -balance exercises 3+ days per week:   Stand somewhere where you have something sturdy to hold onto if you lose balance    1) lift up on toes, then back down, start with 5x per day and work up to 20x   2) stand and lift one leg straight out to the side so that foot is a few inches of the floor, start with 5x each side and work up to 20x each side   3) stand on one foot, start with 5 seconds each side and work up to 20 seconds on each side  If you need ideas or help with getting more active:  -Silver sneakers https://tools.silversneakers.com  -Walk with a Doc: Http://www.duncan-williams.com/  -try to include resistance (weight lifting/strength building) and balance exercises twice per week: or the following link for ideas: http://castillo-powell.com/  buyducts.dk  STRESS MANAGEMENT: -can try meditating, or just sitting quietly with deep breathing while intentionally relaxing all parts of your body for 5 minutes daily -if you need further help with stress, anxiety or depression please follow up with your primary doctor or contact the wonderful folks at [facility]: [phone]  SOCIAL CONNECTIONS: -options in [place] if you wish to engage in more social and exercise related activities:  -Silver sneakers https://tools.silversneakers.com  -Walk with a Doc: Http://www.duncan-williams.com/  -Check out the [facility] Active Adults 50+ section on the [place] of [employer] companies (hiking clubs, book clubs, cards and games, chess, exercise classes, aquatic classes and much more) - see the website for details: https://www.[place].gov/departments/parks-recreation/active-adults50  -YouTube has lots of exercise videos for different ages and abilities as well  -[facility] (a variety of indoor and outdoor inperson activities for adults).  [phone]. [address]  -Virtual Online Classes (a variety of topics): see seniorplanet.org or call [phone]  -consider volunteering at a school, hospice center, church, senior center or elsewhere
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
## 2024-11-11 DIAGNOSIS — L03115 Cellulitis of right lower limb: Secondary | ICD-10-CM | POA: Diagnosis not present

## 2024-11-14 DIAGNOSIS — K080 Exfoliation of teeth due to systemic causes: Secondary | ICD-10-CM | POA: Diagnosis not present

## 2024-11-21 DIAGNOSIS — K080 Exfoliation of teeth due to systemic causes: Secondary | ICD-10-CM | POA: Diagnosis not present

## 2024-11-25 DIAGNOSIS — S22070D Wedge compression fracture of T9-T10 vertebra, subsequent encounter for fracture with routine healing: Secondary | ICD-10-CM | POA: Diagnosis not present

## 2024-12-08 DIAGNOSIS — K080 Exfoliation of teeth due to systemic causes: Secondary | ICD-10-CM | POA: Diagnosis not present

## 2024-12-20 ENCOUNTER — Telehealth: Payer: Self-pay

## 2024-12-20 DIAGNOSIS — E782 Mixed hyperlipidemia: Secondary | ICD-10-CM

## 2024-12-20 DIAGNOSIS — M1990 Unspecified osteoarthritis, unspecified site: Secondary | ICD-10-CM

## 2024-12-20 DIAGNOSIS — E039 Hypothyroidism, unspecified: Secondary | ICD-10-CM

## 2024-12-20 DIAGNOSIS — E2839 Other primary ovarian failure: Secondary | ICD-10-CM

## 2024-12-20 DIAGNOSIS — Z79899 Other long term (current) drug therapy: Secondary | ICD-10-CM

## 2024-12-20 DIAGNOSIS — K219 Gastro-esophageal reflux disease without esophagitis: Secondary | ICD-10-CM

## 2024-12-20 NOTE — Telephone Encounter (Signed)
 Copied from CRM [id number]. Topic: Clinical - Request for Lab/Test Order >> Dec 20, 2024  4:22 PM Wess RAMAN wrote: Reason for CRM: Patient would like to know if she should get labs done prior to her annual physical on 02/07/25  Callback #: [phone]

## 2024-12-26 NOTE — Addendum Note (Signed)
 Addended byBETHA CHARLETT HOWARD K on: 12/26/2024 07:07 PM   Modules accepted: Orders

## 2024-12-26 NOTE — Telephone Encounter (Signed)
 Future orders placed  Last vitamin D  Lab Results  Component Value Date   VD25OH 47.19 01/19/2023

## 2024-12-27 NOTE — Telephone Encounter (Signed)
 Spoke to pt. Lab appt is set.

## 2025-02-03 ENCOUNTER — Other Ambulatory Visit

## 2025-02-07 ENCOUNTER — Encounter: Admitting: Internal Medicine
